# Patient Record
Sex: Male | Born: 1945 | Race: White | Hispanic: No | Marital: Single | Smoking: Current every day smoker
Health system: Southern US, Community
[De-identification: ages and names within clinical notes are randomized; demographics above are authoritative.]

## PROBLEM LIST (undated history)

## (undated) DIAGNOSIS — R531 Weakness: Secondary | ICD-10-CM

## (undated) DIAGNOSIS — E785 Hyperlipidemia, unspecified: Secondary | ICD-10-CM

## (undated) DIAGNOSIS — R609 Edema, unspecified: Secondary | ICD-10-CM

## (undated) DIAGNOSIS — G8929 Other chronic pain: Secondary | ICD-10-CM

## (undated) DIAGNOSIS — Z860100 Personal history of colon polyps, unspecified: Secondary | ICD-10-CM

## (undated) DIAGNOSIS — I739 Peripheral vascular disease, unspecified: Secondary | ICD-10-CM

## (undated) DIAGNOSIS — I2510 Atherosclerotic heart disease of native coronary artery without angina pectoris: Secondary | ICD-10-CM

## (undated) DIAGNOSIS — R600 Localized edema: Secondary | ICD-10-CM

## (undated) DIAGNOSIS — M25571 Pain in right ankle and joints of right foot: Secondary | ICD-10-CM

## (undated) DIAGNOSIS — E119 Type 2 diabetes mellitus without complications: Secondary | ICD-10-CM

## (undated) DIAGNOSIS — M109 Gout, unspecified: Secondary | ICD-10-CM

## (undated) DIAGNOSIS — G4730 Sleep apnea, unspecified: Secondary | ICD-10-CM

## (undated) DIAGNOSIS — C61 Malignant neoplasm of prostate: Secondary | ICD-10-CM

## (undated) DIAGNOSIS — G629 Polyneuropathy, unspecified: Secondary | ICD-10-CM

## (undated) DIAGNOSIS — I779 Disorder of arteries and arterioles, unspecified: Secondary | ICD-10-CM

## (undated) DIAGNOSIS — R011 Cardiac murmur, unspecified: Secondary | ICD-10-CM

## (undated) DIAGNOSIS — I498 Other specified cardiac arrhythmias: Secondary | ICD-10-CM

## (undated) DIAGNOSIS — I10 Essential (primary) hypertension: Secondary | ICD-10-CM

## (undated) DIAGNOSIS — R51 Headache: Secondary | ICD-10-CM

## (undated) DIAGNOSIS — T4145XA Adverse effect of unspecified anesthetic, initial encounter: Secondary | ICD-10-CM

## (undated) DIAGNOSIS — M1990 Unspecified osteoarthritis, unspecified site: Secondary | ICD-10-CM

## (undated) DIAGNOSIS — I495 Sick sinus syndrome: Secondary | ICD-10-CM

## (undated) DIAGNOSIS — M2550 Pain in unspecified joint: Secondary | ICD-10-CM

## (undated) DIAGNOSIS — M549 Dorsalgia, unspecified: Secondary | ICD-10-CM

## (undated) HISTORY — PX: CORONARY ANGIOPLASTY: SHX604

## (undated) HISTORY — DX: Sleep apnea, unspecified: G47.30

## (undated) HISTORY — DX: Hyperlipidemia, unspecified: E78.5

## (undated) HISTORY — PX: COLONOSCOPY: SHX174

## (undated) HISTORY — DX: Essential (primary) hypertension: I10

## (undated) HISTORY — DX: Disorder of arteries and arterioles, unspecified: I77.9

## (undated) HISTORY — PX: ESOPHAGOGASTRODUODENOSCOPY: SHX1529

## (undated) HISTORY — DX: Sick sinus syndrome: I49.5

## (undated) HISTORY — DX: Other specified cardiac arrhythmias: I49.8

## (undated) HISTORY — PX: BACK SURGERY: SHX140

## (undated) HISTORY — DX: Type 2 diabetes mellitus without complications: E11.9

## (undated) HISTORY — DX: Peripheral vascular disease, unspecified: I73.9

## (undated) HISTORY — DX: Atherosclerotic heart disease of native coronary artery without angina pectoris: I25.10

---
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
## 1994-07-11 HISTORY — PX: OTHER SURGICAL HISTORY: SHX169

## 1999-07-12 HISTORY — PX: CHOLECYSTECTOMY: SHX55

## 2002-11-23 ENCOUNTER — Ambulatory Visit (HOSPITAL_BASED_OUTPATIENT_CLINIC_OR_DEPARTMENT_OTHER): Admission: RE | Admit: 2002-11-23 | Discharge: 2002-11-23 | Payer: Self-pay | Admitting: Internal Medicine

## 2002-12-19 ENCOUNTER — Ambulatory Visit (HOSPITAL_BASED_OUTPATIENT_CLINIC_OR_DEPARTMENT_OTHER): Admission: RE | Admit: 2002-12-19 | Discharge: 2002-12-19 | Payer: Self-pay | Admitting: Internal Medicine

## 2005-01-06 ENCOUNTER — Encounter: Admission: RE | Admit: 2005-01-06 | Discharge: 2005-01-06 | Payer: Self-pay | Admitting: Gastroenterology

## 2005-03-23 ENCOUNTER — Ambulatory Visit (HOSPITAL_COMMUNITY): Admission: RE | Admit: 2005-03-23 | Discharge: 2005-03-23 | Payer: Self-pay | Admitting: Gastroenterology

## 2005-04-06 ENCOUNTER — Encounter: Admission: RE | Admit: 2005-04-06 | Discharge: 2005-04-06 | Payer: Self-pay | Admitting: Endocrinology

## 2005-09-05 ENCOUNTER — Encounter: Admission: RE | Admit: 2005-09-05 | Discharge: 2005-09-05 | Payer: Self-pay | Admitting: Endocrinology

## 2006-07-11 HISTORY — PX: CARDIAC CATHETERIZATION: SHX172

## 2007-03-28 ENCOUNTER — Ambulatory Visit: Payer: Self-pay | Admitting: Cardiology

## 2007-03-29 ENCOUNTER — Ambulatory Visit (HOSPITAL_COMMUNITY): Admission: EM | Admit: 2007-03-29 | Discharge: 2007-03-30 | Payer: Self-pay | Admitting: Interventional Cardiology

## 2007-04-11 ENCOUNTER — Ambulatory Visit (HOSPITAL_COMMUNITY): Admission: RE | Admit: 2007-04-11 | Discharge: 2007-04-11 | Payer: Self-pay | Admitting: Internal Medicine

## 2007-05-17 ENCOUNTER — Encounter (HOSPITAL_COMMUNITY): Admission: RE | Admit: 2007-05-17 | Discharge: 2007-07-11 | Payer: Self-pay | Admitting: Interventional Cardiology

## 2007-06-13 ENCOUNTER — Emergency Department (HOSPITAL_COMMUNITY): Admission: EM | Admit: 2007-06-13 | Discharge: 2007-06-13 | Payer: Self-pay | Admitting: Emergency Medicine

## 2007-06-20 ENCOUNTER — Encounter: Admission: RE | Admit: 2007-06-20 | Discharge: 2007-06-20 | Payer: Self-pay | Admitting: Interventional Cardiology

## 2007-07-10 ENCOUNTER — Encounter: Admission: RE | Admit: 2007-07-10 | Discharge: 2007-07-10 | Payer: Self-pay | Admitting: Interventional Cardiology

## 2007-12-07 ENCOUNTER — Emergency Department (HOSPITAL_COMMUNITY): Admission: EM | Admit: 2007-12-07 | Discharge: 2007-12-07 | Payer: Self-pay | Admitting: Emergency Medicine

## 2008-01-25 ENCOUNTER — Encounter: Admission: RE | Admit: 2008-01-25 | Discharge: 2008-01-25 | Payer: Self-pay | Admitting: Internal Medicine

## 2009-07-14 ENCOUNTER — Encounter: Admission: RE | Admit: 2009-07-14 | Discharge: 2009-07-14 | Payer: Self-pay | Admitting: Internal Medicine

## 2009-12-11 ENCOUNTER — Ambulatory Visit (HOSPITAL_COMMUNITY): Admission: RE | Admit: 2009-12-11 | Discharge: 2009-12-11 | Payer: Self-pay | Admitting: Neurological Surgery

## 2010-02-18 ENCOUNTER — Ambulatory Visit: Payer: Self-pay | Admitting: Vascular Surgery

## 2010-02-19 ENCOUNTER — Encounter: Admission: RE | Admit: 2010-02-19 | Discharge: 2010-02-19 | Payer: Self-pay | Admitting: Gastroenterology

## 2010-02-24 ENCOUNTER — Ambulatory Visit: Payer: Self-pay | Admitting: Vascular Surgery

## 2010-02-24 ENCOUNTER — Encounter: Admission: RE | Admit: 2010-02-24 | Discharge: 2010-02-24 | Payer: Self-pay | Admitting: Vascular Surgery

## 2010-08-01 ENCOUNTER — Encounter: Payer: Self-pay | Admitting: Internal Medicine

## 2010-11-23 NOTE — H&P (Signed)
Kenneth Lee, Kenneth Lee             ACCOUNT NO.:  [id number]   MEDICAL RECORD NO.:  [id number]          PATIENT TYPE:  INP   LOCATION:  0104                         FACILITY:  [facility]   PHYSICIAN:  Hollice Espy, M.D.DATE OF BIRTH:  [birth year]   DATE OF ADMISSION:  03/28/2007  DATE OF DISCHARGE:                              HISTORY & PHYSICAL   PRIMARY CARE PHYSICIAN:  Deirdre Peer. Polite, M.D.   CHIEF COMPLAINT:  Left arm and chest pain as well as lightheadedness.   HISTORY OF THE PRESENT ILLNESS:  The patient is a 65 year old within  with a past medical history of tobacco abuse, peripheral vascular  disease with aortic bifemoral bypass graft and hypertension who woke up  the day prior to admission with left arm pain.  He was also having some  tightness in his chest and he felt very lightheaded.  He noted not acute  shortness of breath and no pain elsewhere; but, came into the emergency  room for further evaluation.  In emergency room his EKG was noted to be  in sinus rhythm.  Chest x-ray was within; however, his laboratory  reports were noted for a elevated MB with a normal CPK and troponin.  His third set of enzymes and point of care markers came back slightly  elevated for CBC level given all troponins.  His symptoms, however,  continued to remain with his left arm pain and continued  lightheadedness.  There was a concern whether this was an irregular  cardiac ischemia versus some sort of subclavian steel syndrome.  Further  labs were ordered and he was found to have a moderately elevated D-dimer  at 0.8.  A CT scan of the chest was done to rule out pulmonary embolus.  All this was negative for any kind of an acute pulmonary embolus.  He  was found to have an area concerning in his left lung for a mass.  He  was also was noted have a small adrenal mass as well.  With these  findings there was concern that perhaps he had a tumor.  I discussed  this with the patient, who was  amenable for further testing.   Currently the patient was placed on a nitroglycerin drip for his chest  discomfort, which has since been close to resolving, as this is  very  minimal and barely noticeable, less than the 1 mg.  He complains of a  mild headache, but no vision changes.  No current chest pain, no  palpitations, no shortness of breath, wheezing, or coughing.  He does  have some left upper arm pain.  No abdominal pain.  No hematuria,  dysuria, constipation and diarrhea.  No focal extremity numbness,  weakness or pain other than that described above.   REVIEW OF SYSTEMS:  The review of systems is otherwise negative.   PAST MEDICAL HISTORY:  The patient's past medical history includes;  1. Tobacco abuse.  2. Hypertension.  3. History of peripheral vascular disease status post aortobifemoral      graft.   MEDICATIONS:  1. Cardizem 240  .  2. Aspirin 325  mg one daily.   SOCIAL HISTORY:  The patient smokes about a pack/day.  He denies any  drug or alcohol use.   ALLERGIES:  The patient has an allergy to PENICILLIN.   FAMILY HISTORY:  The family history is noncontributory.   PHYSICAL EXAMINATION:  VITAL SIGNS:  The patient's vital signs show a  temperature is 97.6, heart rate 72 and blood pressure 165/79 down to  129/57, respiratory rate 16, and O2 sat  99% on room air.  GENERAL APPEARANCE:  In general he alert and oriented.  HEENT:  The patient  is normocephalic and atraumatic.  His mucous  membranes are moist.  NECK:  The patient has no carotid bruits.  HEART:  The heart has a regular rate and rhythm.  S1 and S2.  LUNGS:  The lungs are clear to auscultation bilaterally.  ABDOMEN:  The abdomen is soft, and nontender and nondistended with  positive bowel sounds.  EXTREMITIES:  The extremities are without clubbing, cyanosis or edema.   LABORATORY STUDIES:  The lab work showed sodium 139, potassium 2.7,  chloride 104, bicarb 13, BUN 13, creatinine 1.04, and glucose  133.  White count 10, H&H 15 and 43 respectively, MCV 92, and platelet count  176,000 with normal differential.  Coags are normal.  CPK 124, MB 9,  Troponin I less 0.05.  One set of point of care markers showed a CPK of  290 and MB 10.7; the EKG and radiologic studies are as per the HPI.   ASSESSMENT AND PLAN:  1. Lung mass certainly concerning in a patient with a history of heavy      smoking.  We will check this up and order a positron emission      tomography.  2. Hypertension.  We will continue his Cardizem and lisinopril.  3. Chest discomfort, atypical and possibly could be cardiac in nature.      For now we will continue Nitrol paste and check serial sets of      enzymes.  If these are negative we will consider possibly an      angiogram, maybe perhaps as an outpatient of his positron emission      tomography,  is deed, negative.  4. Peripheral vascular disease, stable.      Hollice Espy, M.D.  Electronically Signed     SKK/MEDQ  D:  03/29/2007  T:  03/29/2007  Job:  [id number]

## 2010-11-23 NOTE — Assessment & Plan Note (Signed)
OFFICE VISIT   Kenneth Lee, Kenneth Lee  DOB:  06/26/1946                                       02/18/2010  BJYNW#:[id number]   I saw the patient in the office today concerning some swelling in his  right groin.  This is a pleasant 65 year old gentleman who had undergone  an endovascular intervention by Dr. 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  He underwent a semi-urgent aortobifemoral bypass graft on  01/26/1995.  Of note, the proximal anastomosis was end-to-side and he  required a fairly extensive endarterectomy in the right groin with  extension down on to the superficial femoral artery.  He had been lost  to followup as he was doing quite well with no lower extremity symptoms.  Specifically he had no claudication, rest pain or nonhealing ulcers.  Approximately 2 weeks ago he just happened to notice some swelling in  the right groin that he was concerned about.  He comes in to have this  evaluated.  Of note, he has had no claudication, rest pain or nonhealing  ulcers.  He has had no fever or chills.  He has noted no drainage from  the groin and no erythema in the groin.  He has overall been doing well  except for he has been having problems with back pain and then became  quite constipated from pain medicine and he is recovering from all of  this.   His past medical history is significant for hypertension and  hypercholesterolemia, both of which are stable on his current  medications and he is followed closely by Dr. Nehemiah Settle.  He denies any  history of diabetes, history of previous myocardial infarction, history  of congestive heart failure or history of COPD.   SOCIAL HISTORY:  He is widowed.  He has two children.  He does not smoke  cigarettes.   FAMILY HISTORY:  There is no history of premature cardiovascular disease  that he is aware of.   REVIEW OF SYSTEMS:  GENERAL:  He  has had no recent weight loss, weight  gain or problems with his appetite.  He has had no fever or chills.  CARDIOVASCULAR:  He has had no chest pain, chest pressure, palpitations  or arrhythmias.  He has had no history of stroke, TIAs.  He has had no  history of DVT or phlebitis.  GI, neurologic, pulmonary, hematologic, urinary, ENT, musculoskeletal,  psychiatric and integumentary review of systems is unremarkable and is  documented on the medical history form in his chart.   PHYSICAL EXAMINATION:  General:  This is a pleasant 65 year old  gentleman who appears his stated age.  Blood pressure is 145/90,  temperature is 98.2, saturation 100%.  HEENT:  Unremarkable.  Lungs:  Are clear bilaterally to auscultation without rales, rhonchi or  wheezing.  Cardiovascular:  I do not detect any carotid bruits.  He has  a regular rate and rhythm.  He has palpable femoral, popliteal and pedal  pulses bilaterally.  Abdomen:  Soft, nontender with no evidence of  hernia.  Musculoskeletal:  There are no major deformities or cyanosis.  Neurological:  He has no focal weakness or paresthesias.  In  specifically examining the right groin with him supine certainly I do  not see  any swelling.  With him standing there simply appears to be a  fold in the skin of adipose tissue.  I do not see any evidence of  erythema or suggestion of infection.   Regardless of his exam which appears fairly benign given that he has a  prosthetic graft I think it would be imperative to obtain a CT scan just  to be sure there is no evidence of graft infection given his history of  swelling in the right groin.  We will arrange for the CT scan and I will  see him back after this.  We will make further recommendations pending  these results.     Di Kindle. Edilia Bo, M.D.  Electronically Signed   CSD/MEDQ  D:  02/18/2010  T:  02/19/2010  Job:  [id number]   cc:   Deirdre Peer. Polite, M.D.

## 2010-11-23 NOTE — Consult Note (Signed)
NAMEDINO, BORNTREGER             ACCOUNT NO.:  [id number]   MEDICAL RECORD NO.:  [id number]          PATIENT TYPE:  EMS   LOCATION:  ED                           FACILITY:  [facility]   PHYSICIAN:  Bevelyn Buckles. Bensimhon, MDDATE OF BIRTH:  [birth year]   DATE OF CONSULTATION:  DATE OF DISCHARGE:                                 CONSULTATION   CONSULTING PHYSICIAN:  Dr. Deretha Emory   PRIMARY CARE PHYSICIAN:  Dr. Nehemiah Settle   The patient's total visit time:  Appropriately 40 minutes.   The patient is being admitted for possible rule-out  coronary syndrome.   HISTORY OF PRESENT ILLNESS:  Mr. Devera is a 65 year old male with  history of possible heavy alcohol use and hypertension, who presents  with apparently sudden onset of left biceps pain with associated  lightheadedness and tachy palpations that woke him up on the evening  prior to admission.  Patient notes this has been going on for the last  few months, but never woke him up.  Patient apparently had an episode in  the ER and  there are no changes on telemetry.  He denies any frank  shortness of breath or chest pain, no paroxysmal nocturnal dyspnea or  orthopnea, no fevers or cough, no dysuria, hematuria.  In the emergency  room, he was given aspirin and was placed on nitroglycerin drip.   CARDIAC HISTORY:  Patient notes he had a left heart catheterization and  coronary angiogram 12 years ago and it was okay and a history five  years ago of stress testing which was unremarkable per the patient.   PAST MEDICAL HISTORY/PAST SURGICAL HISTORY:  1. History of aortic femoral bypass bilaterally 12 years ago.  2. Status post cholecystectomy in 1990.  3. History of back surgery.  4. History of hypertension.  5. History of obstructive sleep apnea on CPAP.   MEDICATIONS:  He notes he is on:  1. Cartia XL 250 mg per patient.  2. Lisinopril 20 mg daily.  3. Enteric-coated aspirin 325 mg daily.  4. Multivitamin.   ALLERGIES:  HE IS ALLERGIC TO  PENICILLIN WHICH CAUSES HAND SWELLING AND  FACIAL SWELLING.   SOCIAL HISTORY:  He smokes one pack per day and has been doing so since  his teenage years, occasional wine.  The patient is currently employed  in [employer].  He is accompanied by his wife.   FAMILY HISTORY:  Positive for brother and father with coronary artery  disease; brother had a myocardial infarction.  Father's coronary artery  disease onset was 85 and brother 89.   REVIEW OF SYSTEMS:  The 12-point review of systems is negative unless  stated above.   PHYSICAL EXAMINATION:  VITAL SIGNS:  Temperature is 97.6 with a blood  pressure of 139/68, pulse 56, respiratory rate of 14, sats are 98% on  two liters of nasal cannula.  GENERAL:  He is an obese male lying flat in bed in no acute distress.  HEENT:  Normocephalic, atraumatic.  His pupils are equal, round and  reactive to light.  Extraocular muscles intact. Oropharynx is moist with  no posterior pharyngeal  lesions.  NECK:  Supple with no lymphadenopathy.  No thyromegaly.  No carotid  bruits.  LUNGS:  Clear to auscultation bilaterally.  CARDIOVASCULAR:  Regular rhythm and rate.  No murmurs, rubs or gallops.  Normal S1, S2.  No S3 or S4.  EXTREMITIES:  On examination of the left arm biceps area, there is no  apparent tenderness on palpation, no swelling, no redness although the  patient notes he does a knot in the area of distal bicep.  Patient's  extremities show no clubbing or cyanosis.  There is trace lower  extremity edema.  Vascular exam is significant for warmth plus bilateral  dorsalis pedis and posterior tibial  pulses which are decreased.  No  apparent carotid bruit. Good radial pulses.  NEUROLOGIC EXAM:  He is alert and oriented x4.  Cranial nerves II-XII  grossly intact.  Strength and sensation are grossly intact.   LABS:  Sodium is 139, potassium 3.7, chloride 104, bicarb 27, BUN 13,  creatinine 1.04, glucose 133.  INR 1.0.  Cardiac markers performed at   2250 showed an MB of 9.0, troponin less than 0.05.  At 2350, MB was 8.3,  troponin less than 0.05.  D-dimer was elevated at 0.85.  At 0015 on the  day of admission, March 29, 2007, troponin was 0.02 and CK elevated  at 311, MB 12.3 with an index of 4.0, INR 1.0.  White count of 10.2,  hemoglobin 16.1, hematocrit 42.9, platelets 176 with a normal  differential.   CT of the chest is pending.   EKG showed normal sinus rhythm and normal axis, normal intervals with a  PR interval of 186, QRS 104 and QT corrected 413 msec with good R wave  progression, no evidence of ST-T changes and no apparent T waves.   ASSESSMENT/PLAN:  This is a patient with  history of significant  peripheral vascular disease who is having left arm pain paroxysmally  associated with lightheadedness.  The concern is for possible subclavian  stenosis that is causing cardiac awareness due to differential blood  flow to the brain. This also be causing cramping of the arm.  His pulses  are good radially.  We counseled the patient at length to discontinued  tobacco abuse and recommended that he needed to quit now.  It is  suggested that he undergo an arteriogram of that area of the arm and  bilaterally, or a left heart catheterization procedure, coronary  angiogram with a look also in the upper arterial tree in the thorax  which would also take care of a coronary blood flow assessment.      Darryl D. Prime, MD   Electronically Signed     ______________________________  Bevelyn Buckles. Bensimhon, MD   DDP/MEDQ  D:  03/29/2007  T:  03/29/2007  Job:  [id number]

## 2010-11-23 NOTE — Discharge Summary (Signed)
Kenneth Lee, Kenneth Lee             ACCOUNT NO.:  [id number]   MEDICAL RECORD NO.:  [id number]          PATIENT TYPE:  OIB   LOCATION:  6527                         FACILITY:  [facility]   PHYSICIAN:  Deirdre Peer. Polite, M.D. DATE OF BIRTH:  1945-11-01   DATE OF ADMISSION:  03/29/2007  DATE OF DISCHARGE:  03/30/2007                               DISCHARGE SUMMARY   DISCHARGE DIAGNOSES:  1. Atypical chest pain status post cardiac catheterization, which      revealed proximal right coronary artery stenosis status post bare-      metal stent, moderate left anterior descending disease.  There is      heavy calcification in the mid left anterior descending, gives      origin to moderate size diagonals, 75% stenosis in the proximal      segment of the second diagonal.  First diagonal contains a 40%      proximal stenosis.  Diffuse 50% obstruction is noted in the      proximal mid left anterior descending.  Circumflex vessel 40%      obstruction in the ostium of the first marginal.  Again as stated,      right coronary artery 95% obstructed with eccentric lesion in the      proximal vessel with severe proximal right coronary artery stenosis      with large distal territory, treated successfully with bare-metal      stent of 95% to 0% with TIMI grade 3 flow.  Normal left ventricular      function.  Again, heavy calcification and moderate left anterior      descending, and diagonal disease.  Final recommendation -      aggressive risk factor modification, aspirin and Plavix for a      minimum of four weeks.  2. Right upper lobe lung mass.  Suspicious for cancer, 1.8 cm via CAT      scan.  Patient to have outpatient PET scan with potential biopsy in      4 to 6 weeks.  3. Hypertension.  4. Dyslipidemia.  5. Obesity.  6. Tobacco dependence.  7. Sleep apnea.  8. History of peripheral vascular disease status post aortofemoral      bypass.  9. A 2 cm indeterminate right adrenal mass.   DISCHARGE  MEDICATIONS:  1. Aspirin 325 mg one daily.  2. Plavix 75 mg daily.  3. Lisinopril daily.  4. Metoprolol 50 mg daily.  5. Simvastatin 80 mg daily.   DISPOSITION:  Discharged to home in stable condition.  Asked to follow  up with primary MD in one week.  Follow up with Dr. Garnette Scheuermann in  approximately 2 to 3 weeks.  The patient has been advised to stop  smoking immediately.  To start a low fat, low cholesterol diet.   CONSULTANTS:  Dr. Garnette Scheuermann.   STUDIES:  Heart catheterization as stated above.  CT angio of the chest  showed no evidence of PE, a 1.8 cm irregular mass-like opacity of right  upper lobe suspicious for neoplasm.  PET scan recommended.  CBC within  normal limits.  BMET within normal limits.  Troponin I 0.05.  Total  cholesterol 194, HDL 39, LDL 27.  EKG sinus rhythm without acute  abnormalities.   HISTORY OF PRESENT ILLNESS:  A 65 year old male with multiple medical  problems presented to the hospital with atypical chest pain  characterized by left arm pain and some palpations.  In the ED he was  evaluated, had several studies, admission was deemed necessary for  further evaluation and treatment.  Please dictated H&P for further  details.   PAST MEDICAL HISTORY:  As stated above.   CURRENT MEDICATIONS:  On admission included:  1. Cardizem.  2. Aspirin.  3. Lisinopril.   SOCIAL HISTORY:  Positive tobacco, social alcohol.  No drugs.   ALLERGIES:  STATES ALLERGIC PENICILLIN.   FAMILY HISTORY:  Per admission H&P.   HOSPITAL COURSE:  1. The patient is admitted to a telemetry floor bed for evaluation and      treatment of his multiple comorbidities.  The patient presented      with atypical chest pain, was seen in consultation by cardiology,      cardiac catheterization was recommended.  Ultimately the patient      underwent cardiac catheterization and had a bare-metal stent in his      RCA.  Aggressive risk factor modification was recommended for the      other  abnormalities.  2. Atypical pain in the left arm.  No recurrence.  The patient tried      to describe it, he had just a vague sensation of pain in his left      arm.  He has 2+ pulse, no significant decrease in blood pressure      when taken from that arm.  Question was this was just his atypical      presentation of cardiac disease.  He will have further outpatient      followup as needed.  3. Right upper lobe mass 1.8 cm suspicious for neoplasm.  PET      recommended.  The patient will have outpatient PET scan and further      evaluation and followup.  I have discussed this with the patient in      detail and made him aware that we wanted to make sure this is not      cancer, he understands.  4. Tobacco dependence.  Recommended stop now.  5. Hypertension.  Slightly greater than optimal at discharge.  Meds as      stated above.  6. Dyslipidemia.  Simvastatin has been started at 80 mg.  7. Sleep apnea.  Continue CPAP.   At this time the patient is being discharged in stable condition.  New  medication includes Plavix, metoprolol, he is also going to continue to  aspirin 325 daily, to stop smoking and have further followup on an  outpatient basis.      Deirdre Peer. Polite, M.D.  Electronically Signed     RDP/MEDQ  D:  03/30/2007  T:  03/31/2007  Job:  [id number]

## 2010-11-23 NOTE — Cardiovascular Report (Signed)
NAMEAJ, CRUNKLETON             ACCOUNT NO.:  [id number]   MEDICAL RECORD NO.:  [id number]          PATIENT TYPE:  OIB   LOCATION:  6527                         FACILITY:  [facility]   PHYSICIAN:  Lyn Records, M.D.   DATE OF BIRTH:  [birth year]   DATE OF PROCEDURE:  DATE OF DISCHARGE:                            CARDIAC CATHETERIZATION   INDICATIONS FOR PROCEDURE:  The patient has coronary artery disease  documented by heavy calcification noted on chest CT in the left  coronary.  He has had 2 positive CK-MBs.  He presented with atypical arm  pain.  Cath is being done to rule out coronary artery disease.   PROCEDURE PERFORMED:  1. Left heart cath.  2. Coronary angiogram.  3. Left ventriculorrhaphy.  4. Bare-metal stent proximal RCA.   DESCRIPTION OF PROCEDURE:  After informed consent, a 6-French sheath was  placed in the right femoral artery using the modified Seldinger  technique.  The patient received several aliquots of IV Versed and  fentanyl 1/25 for sedation.  Local Xylocaine infiltration was used.  Sheath was placed with no difficulties in a modified Seldinger  technique.  A 6-French A2 multipurpose catheter was then used for  hemodynamic recordings, left ventriculorrhaphy by hand injection, and  selective left coronary angiography.  Injecting his right catheter, 6-  [place] was used for the right coronary angiography.   After reviewing the digital images, it was felt that the right coronary  artery was a large vessel with severe proximal obstruction that was at  risk for causing the patient to have rather large infarction if it  occluded.  After speaking with the patient and wife, we proceeded with  the PCI on the right coronary.   The 600 mg oral Plavix was administered before PCI.  A bolus and  infusion of Angiomax was started.  We used a side-hole JR4 guide  catheter and ASAHI Prowater guide wire to perform the intervention.  We  predilated the lesion with a 3.0 x 12  mm long Maverick balloon and then  placed a 3.5 x 15 Vision stent and post dilated with a 3.5 x 12 quantum  Maverick to 16 atmospheres.  A very nice angiographic result was  obtained.  During balloon occlusion of the right coronary, the patient  developed chest pressure not left arm pain.  Therefore, the patient's  left arm pain was from some other source than his coronary artery  disease.   Manual compression would be used to achieve hemostasis.  Angiomax will  be discontinued.   RESULTS:  1. Hemodynamic data:      a.     Aortic pressure 146/74.      b.     Left ventricular pressure 154/15.  2. Left ventriculorrhaphy:  Left ventricle was normal in size, normal      contractility, EF 65%.  3. Coronary angiography.      a.     Left main coronary, widely patent moderate calcification.      b.     Left anterior descending:  Heavy calcification in the mid  LAD.  It gives origin to 2 moderate-sized diagonals.  They arise       from near single ostium in the mid LAD.  There is 75% stenosis in       the proximal segment of the second diagonal.  The first diagonal       contains a 40% proximal stenosis.  Diffuse 50% obstruction is       noted in the proximal mid LAD.      c.     Circumflex artery:  This vessel gives origin to 2 obtuse       marginal branches.  There is 40% obstruction in the ostium of the       first marginal.  No high grade obstruction is seen.      d.     Right coronary:  The right coronary artery is 95% obstructed       with an eccentric lesion in the proximal vessel.  Heavy plaquing       is noted in the mid vessel with obstruction up to 50%.  PDA and       left ventricular branches arise distally.  The right coronary was       dominant territory with a lot of myocardium at risk.  4. PCI of right coronary:  The RCA 95% stenosis was reduced to 0%      after predilatation with a 3.0 Maverick balloon stent deployment      using a Vision 3.5 x 15 and post dilatation  with a 3.5 x 12 quantum      to 16 atmosphere x2 inflations.  A 0% stenosis was noted      postprocedure with TIMI grade 3 flow.  No symptoms were present at      completion of the case.   CONCLUSIONS:  1. Severe proximal RCA stenosis with large distal territory treated      successfully with bare-metal stent from 95% and 0% with TIMI grade      3 flow.  2. Normal LV function.  3. Heavy calcification and moderate LAD and diagonal disease as      described above.   PLAN:  1. Aggressive risk factor modification.  2. Aspirin and Plavix for a minimum of 4 weeks.  3. The patient will be eligible to have a lung biopsy if indicated, 4      to 6 weeks from today's date.  4. Hopeful discharge in a.m.  5. Start statin therapy.      Lyn Records, M.D.  Electronically Signed     HWS/MEDQ  D:  03/29/2007  T:  03/30/2007  Job:  [id number]

## 2010-11-23 NOTE — Assessment & Plan Note (Signed)
OFFICE VISIT   Kenneth Lee, Kenneth Lee  DOB:  Oct 04, 1945                                       02/24/2010  ZOXWR#:[id number]   I saw patient in the office today to follow up on his CAT scan.  I had  seen him on 02/18/2010.  He was concerned about some swelling in the  right groin.  He had undergone previous semi-emergent aortobifemoral  bypass graft back in 1996.  I was unimpressed with his exam but did feel  that we should get a CT scan to be sure there were no problems with his  graft.  A CT scan today was performed and showed no complicating  features associated with his graft.  There is no evidence of fluid or  abnormalities associated with the graft.  The only note was some mild  potential swelling in the terminal ileum, and he had been having some  problems with his bowels after he got significant constipation after  being on a lot of pain medicines for back pain after an injury he  sustained while waterskiing.   On exam, he is afebrile at 97.4, blood pressure 115/75, heart rate is  70.  Abdomen is soft and nontender.  His incisions in the groin look  fine.   I have reassured him that we do not see any problems with his CAT scan.  I have reviewed his study, which shows no problems with his graft.  We  will see him back p.r.n.  We again discussed the importance of receiving  prophylactic antibiotics for any invasive procedures.     Di Kindle. Edilia Bo, M.D.  Electronically Signed   CSD/MEDQ  D:  02/24/2010  T:  02/24/2010  Job:  [id number]

## 2010-11-26 NOTE — Op Note (Signed)
NAMEKRISTIN, Kenneth Lee             ACCOUNT NO.:  [id number]   MEDICAL RECORD NO.:  [id number]          PATIENT TYPE:  AMB   LOCATION:  ENDO                         FACILITY:  [facility]   PHYSICIAN:  Danise Edge, M.D.   DATE OF BIRTH:  02-Sep-1945   DATE OF PROCEDURE:  03/23/2005  DATE OF DISCHARGE:                                 OPERATIVE REPORT   PROCEDURE:  Colonoscopy.   PROCEDURE INDICATIONS:  Mr. Forrest Jaroszewski is a 64 year old male born Jun 10, 1946.  On Nov 12, 2004 he underwent a proctocolonoscopy to the proximal  descending colon.  I was unable to examine the cecum due to colonic loop  formation.  From the left colon at 40 cm from the anal verge, a diminutive  polyp was removed with the electrocautery snare, but the specimen could not  be retrieved for pathology.   On January 06, 2005, he underwent a virtual colonoscopy which revealed a 7-8 mm  sessile polypoid lesion at 68 cm in the descending colon.  Repeat  colonoscopy is scheduled today to remove the descending colon polyp.   ENDOSCOPIST:  Danise Edge, M.D.   PREOPERATIVE MEDICATION:  Versed 10 mg, Demerol 100 mg.   DESCRIPTION OF PROCEDURE:  After obtaining informed consent, Mr. Menken was  placed in the left lateral decubitus position.  I administered intravenous  Demerol and intravenous Versed to achieve conscious sedation for the  procedure. The patient's blood pressure, oxygen saturation and cardiac  rhythm were monitored throughout the procedure and documented in the medical  record.   Anal inspection and digital rectal exam were normal.  The Olympus adjustable  pediatric colonoscope was introduced into the rectum and easily advanced to  the proximal ascending colon.  The ileocecal valve was visible, but I could  not intubate the cecum due to colonic loop formation.  Colonic preparation  for the exam today was excellent.   FINDINGS:  1.  Rectum normal. Retroflexed view of the distal rectum normal.  2.   Sigmoid colon and descending colon:  Left colonic diverticulosis.  No      polyp identified.  3.  Splenic flexure normal.  4.  Transverse colon normal.  5.  Hepatic flexure normal.  6.  Ascending colon normal.   ASSESSMENT:  Left colonic diverticulosis.  I could not identify a colorectal  polyps by repeat colonoscopy despite a good visualization of the descending  colon.   RECOMMENDATIONS:  Mr. Kresse has hypertension.  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   I have referred Mr. Orvis to [facility] to evaluate the right adrenal  nodule and rule out a functioning nodule (Cushing's syndrome,  pheochromocytoma, and primary hyperaldosteronism).           ______________________________  Danise Edge, M.D.     MJ/MEDQ  D:  03/23/2005  T:  03/23/2005  Job:  [id number]   cc:   Dorisann Frames, M.D.  [address] [place] [address]  Fax: [phone]

## 2011-04-06 LAB — DIFFERENTIAL
Basophils Absolute: 0
Basophils Relative: 0
Eosinophils Absolute: 0.2
Eosinophils Relative: 2
Lymphocytes Relative: 33
Lymphs Abs: 2.6
Monocytes Absolute: 0.7
Monocytes Relative: 9
Neutro Abs: 4.5
Neutrophils Relative %: 56

## 2011-04-06 LAB — POCT CARDIAC MARKERS
CKMB, poc: 5.3
CKMB, poc: 6.7
Myoglobin, poc: 113
Myoglobin, poc: 141
Operator id: 161631
Operator id: 161631
Troponin i, poc: <0.05
Troponin i, poc: <0.05

## 2011-04-06 LAB — POCT I-STAT, CHEM 8
BUN: 18
Calcium, Ion: 1.11 — ABNORMAL LOW
Chloride: 107
Creatinine, Ser: 0.8
Glucose, Bld: 144 — ABNORMAL HIGH
HCT: 40
Hemoglobin: 13.6
Potassium: 3.4 — ABNORMAL LOW
Sodium: 142
TCO2: 24

## 2011-04-06 LAB — CBC
HCT: 39.8
Hemoglobin: 14.1
MCHC: 35.4
MCV: 93.2
Platelets: 147 — ABNORMAL LOW
RBC: 4.27
RDW: 13
WBC: 8

## 2011-04-18 LAB — I-STAT 8, (EC8 V) (CONVERTED LAB)
Acid-Base Excess: 1
BUN: 19
Bicarbonate: 25.1 — ABNORMAL HIGH
Chloride: 105
Glucose, Bld: 115 — ABNORMAL HIGH
HCT: 40
Hemoglobin: 13.6
Operator id: 196461
Potassium: 3.8
Sodium: 138
TCO2: 26
pCO2, Ven: 36.8 — ABNORMAL LOW
pH, Ven: 7.441 — ABNORMAL HIGH

## 2011-04-18 LAB — CBC
HCT: 38.1 — ABNORMAL LOW
Hemoglobin: 13.4
MCHC: 35.1
MCV: 90.8
Platelets: 170
RBC: 4.19 — ABNORMAL LOW
RDW: 12.8
WBC: 8.7

## 2011-04-18 LAB — POCT I-STAT CREATININE
Creatinine, Ser: 0.9
Operator id: 196461

## 2011-04-18 LAB — DIFFERENTIAL
Basophils Absolute: 0
Basophils Relative: 0
Eosinophils Absolute: 0.1 — ABNORMAL LOW
Eosinophils Relative: 1
Lymphocytes Relative: 23
Lymphs Abs: 2
Monocytes Absolute: 0.9
Monocytes Relative: 11
Neutro Abs: 5.7
Neutrophils Relative %: 65

## 2011-04-18 LAB — POCT CARDIAC MARKERS
CKMB, poc: 6.3
CKMB, poc: 7.3
Myoglobin, poc: 129
Myoglobin, poc: 98.1
Operator id: 196461
Operator id: 196461
Troponin i, poc: <0.05
Troponin i, poc: <0.05

## 2011-04-21 LAB — BASIC METABOLIC PANEL
BUN: 11
BUN: 13
CO2: 27
CO2: 29
Calcium: 9
Calcium: 9.4
Chloride: 104
Chloride: 106
Creatinine, Ser: 0.98
Creatinine, Ser: 1.04
GFR calc Af Amer: >60
GFR calc Af Amer: >60
GFR calc non Af Amer: >60
GFR calc non Af Amer: >60
Glucose, Bld: 115 — ABNORMAL HIGH
Glucose, Bld: 133 — ABNORMAL HIGH
Potassium: 3.7
Potassium: 4.4
Sodium: 139
Sodium: 139

## 2011-04-21 LAB — CBC
HCT: 41
HCT: 42.9
Hemoglobin: 14.1
Hemoglobin: 15
MCHC: 34.4
MCHC: 34.9
MCV: 92.1
MCV: 93.4
Platelets: 160
Platelets: 176
RBC: 4.39
RBC: 4.65
RDW: 12.7
RDW: 13.2
WBC: 10.1
WBC: 8.9

## 2011-04-21 LAB — CK TOTAL AND CKMB (NOT AT ARMC)
CK, MB: 10.7 — ABNORMAL HIGH
CK, MB: 12.3 — ABNORMAL HIGH
Relative Index: 3.7 — ABNORMAL HIGH
Relative Index: 4 — ABNORMAL HIGH
Total CK: 290 — ABNORMAL HIGH
Total CK: 311 — ABNORMAL HIGH

## 2011-04-21 LAB — DIFFERENTIAL
Basophils Absolute: 0.1
Basophils Relative: 1
Eosinophils Absolute: 0.2
Eosinophils Relative: 2
Lymphocytes Relative: 40
Lymphs Abs: 4.1 — ABNORMAL HIGH
Monocytes Absolute: 0.9 — ABNORMAL HIGH
Monocytes Relative: 8
Neutro Abs: 4.9
Neutrophils Relative %: 49

## 2011-04-21 LAB — LIPID PANEL
Cholesterol: 194
HDL: 39 — ABNORMAL LOW
LDL Cholesterol: 127 — ABNORMAL HIGH
Total CHOL/HDL Ratio: 5
Triglycerides: 140
VLDL: 28

## 2011-04-21 LAB — POCT CARDIAC MARKERS
CKMB, poc: 7
CKMB, poc: 8.3
CKMB, poc: 9
Myoglobin, poc: 111
Myoglobin, poc: 124
Myoglobin, poc: 148
Operator id: 4531
Operator id: 4533
Operator id: 4533
Troponin i, poc: <0.05
Troponin i, poc: <0.05
Troponin i, poc: <0.05

## 2011-04-21 LAB — D-DIMER, QUANTITATIVE (NOT AT ARMC): D-Dimer, Quant: 0.85 — ABNORMAL HIGH

## 2011-04-21 LAB — PROTIME-INR
INR: 1
Prothrombin Time: 13.4

## 2011-04-21 LAB — TROPONIN I: Troponin I: 0.02

## 2011-07-19 DIAGNOSIS — M5126 Other intervertebral disc displacement, lumbar region: Secondary | ICD-10-CM | POA: Diagnosis not present

## 2011-07-19 DIAGNOSIS — IMO0002 Reserved for concepts with insufficient information to code with codable children: Secondary | ICD-10-CM | POA: Diagnosis not present

## 2011-07-28 DIAGNOSIS — M47817 Spondylosis without myelopathy or radiculopathy, lumbosacral region: Secondary | ICD-10-CM | POA: Diagnosis not present

## 2011-08-09 DIAGNOSIS — M542 Cervicalgia: Secondary | ICD-10-CM | POA: Diagnosis not present

## 2011-08-09 DIAGNOSIS — M79609 Pain in unspecified limb: Secondary | ICD-10-CM | POA: Diagnosis not present

## 2011-09-09 DIAGNOSIS — H1030 Unspecified acute conjunctivitis, unspecified eye: Secondary | ICD-10-CM | POA: Diagnosis not present

## 2012-03-29 DIAGNOSIS — E785 Hyperlipidemia, unspecified: Secondary | ICD-10-CM | POA: Diagnosis not present

## 2012-05-14 DIAGNOSIS — I10 Essential (primary) hypertension: Secondary | ICD-10-CM | POA: Diagnosis not present

## 2012-05-14 DIAGNOSIS — E785 Hyperlipidemia, unspecified: Secondary | ICD-10-CM | POA: Diagnosis not present

## 2012-05-14 DIAGNOSIS — I779 Disorder of arteries and arterioles, unspecified: Secondary | ICD-10-CM | POA: Diagnosis not present

## 2012-05-14 DIAGNOSIS — I2510 Atherosclerotic heart disease of native coronary artery without angina pectoris: Secondary | ICD-10-CM | POA: Diagnosis not present

## 2012-05-14 DIAGNOSIS — I495 Sick sinus syndrome: Secondary | ICD-10-CM | POA: Diagnosis not present

## 2012-06-01 DIAGNOSIS — I779 Disorder of arteries and arterioles, unspecified: Secondary | ICD-10-CM | POA: Diagnosis not present

## 2012-06-01 DIAGNOSIS — R599 Enlarged lymph nodes, unspecified: Secondary | ICD-10-CM | POA: Diagnosis not present

## 2012-06-01 DIAGNOSIS — I6529 Occlusion and stenosis of unspecified carotid artery: Secondary | ICD-10-CM | POA: Diagnosis not present

## 2012-06-12 DIAGNOSIS — Z23 Encounter for immunization: Secondary | ICD-10-CM | POA: Diagnosis not present

## 2013-01-25 DIAGNOSIS — D692 Other nonthrombocytopenic purpura: Secondary | ICD-10-CM | POA: Diagnosis not present

## 2013-03-19 DIAGNOSIS — L570 Actinic keratosis: Secondary | ICD-10-CM | POA: Diagnosis not present

## 2013-03-19 DIAGNOSIS — D239 Other benign neoplasm of skin, unspecified: Secondary | ICD-10-CM | POA: Diagnosis not present

## 2013-03-19 DIAGNOSIS — L821 Other seborrheic keratosis: Secondary | ICD-10-CM | POA: Diagnosis not present

## 2013-03-19 DIAGNOSIS — L909 Atrophic disorder of skin, unspecified: Secondary | ICD-10-CM | POA: Diagnosis not present

## 2013-03-19 DIAGNOSIS — D2330 Other benign neoplasm of skin of unspecified part of face: Secondary | ICD-10-CM | POA: Diagnosis not present

## 2013-03-19 DIAGNOSIS — D1801 Hemangioma of skin and subcutaneous tissue: Secondary | ICD-10-CM | POA: Diagnosis not present

## 2013-05-07 DIAGNOSIS — Z23 Encounter for immunization: Secondary | ICD-10-CM | POA: Diagnosis not present

## 2013-05-09 ENCOUNTER — Encounter: Payer: Self-pay | Admitting: Interventional Cardiology

## 2013-05-13 ENCOUNTER — Ambulatory Visit: Payer: Self-pay | Admitting: Interventional Cardiology

## 2013-05-14 ENCOUNTER — Telehealth: Payer: Self-pay

## 2013-05-14 ENCOUNTER — Other Ambulatory Visit: Payer: Self-pay

## 2013-05-14 MED ORDER — FOSINOPRIL SODIUM 20 MG PO TABS: 20.0000 mg | ORAL_TABLET | Freq: Two times a day (BID) | ORAL | Status: DC

## 2013-05-14 NOTE — Telephone Encounter (Signed)
Done

## 2013-05-21 ENCOUNTER — Ambulatory Visit (INDEPENDENT_AMBULATORY_CARE_PROVIDER_SITE_OTHER): Payer: Medicare Other | Admitting: Interventional Cardiology

## 2013-05-21 ENCOUNTER — Encounter: Payer: Self-pay | Admitting: Interventional Cardiology

## 2013-05-21 VITALS — BP 164/80 | HR 47 | Ht 71.0 in | Wt 238.0 lb

## 2013-05-21 DIAGNOSIS — I2510 Atherosclerotic heart disease of native coronary artery without angina pectoris: Secondary | ICD-10-CM | POA: Diagnosis not present

## 2013-05-21 DIAGNOSIS — I10 Essential (primary) hypertension: Secondary | ICD-10-CM | POA: Diagnosis not present

## 2013-05-21 DIAGNOSIS — E785 Hyperlipidemia, unspecified: Secondary | ICD-10-CM | POA: Diagnosis not present

## 2013-05-21 NOTE — Patient Instructions (Signed)
Your physician recommends that you continue on your current medications as directed. Please refer to the Current Medication list given to you today.  Your physician wants you to follow-up in: 1 year You will receive a reminder letter in the mail two months in advance. If you don't receive a letter, please call our office to schedule the follow-up appointment.  Try to stay active 

## 2013-05-21 NOTE — Progress Notes (Signed)
Patient ID: Kenneth Lee, male   DOB: [birth year], 67 y.o.   MRN: [id number]    [address] [name], [place]  [address] Phone: [phone] Fax:  [phone]  Date:  05/21/2013   ID:  Kenneth Lee, DOB 1945-07-25, MRN [id number]  PCP:  Kenneth Apo, MD   ASSESSMENT:  1. Coronary artery disease, stable without angina. Status post RCA bare-metal stent, 2008. 2. Hypertension, controlled 3. 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 Hyperlipidemia  PLAN:  1. No change in therapy. Encourage active lifestyle. 2. Watch diet to help control weight 3. No specific cardiac evaluation indicated   SUBJECTIVE: Kenneth Lee is a 67 y.o. male who is doing relatively well. He is sleeping well and as a result feels fatigued most of the time. He denies cardiopulmonary complaints. He specifically denies angina and nitroglycerin use. There no particular medication side effects. He has not had syncope and denies lower extremity swelling. He is not been as active as he should be but when he works in the yard and does other activities his back limits his efforts. He is considering having surgery.   Wt Readings from Last 3 Encounters:  05/21/13 238 lb (107.956 kg)     Past Medical History  Diagnosis Date  . Erectile dysfunction   . Hypertension   . Sleep apnea   . Hyperlipidemia     mixed hyperlidemia  . Goiter   . Other specified cardiac dysrhythmias(427.89)   . Sinoatrial node dysfunction   . Carotid disease, bilateral   . Coronary artery disease     with bare mental stent 2008 and RCA is Card   Dr.Smith  . History of aortoiliofemoral vascular bypass     Dr Edilia Bo    Current Outpatient Prescriptions  Medication Sig Dispense Refill  . amLODipine (NORVASC) 5 MG tablet Take 5 mg by mouth daily.       Marland Kitchen BYSTOLIC 10 MG tablet Take by mouth daily.       . clindamycin (CLEOCIN) 150 MG capsule Take 150 mg by mouth as needed (pRIOR TO DENTAL  PROCEDURES).       . CRESTOR 10 MG tablet Take 10 mg by mouth daily.       . fosinopril (MONOPRIL) 20 MG tablet Take 1 tablet (20 mg total) by mouth 2 (two) times daily.  60 tablet  1  . furosemide (LASIX) 40 MG tablet Take 40 mg by mouth daily.       . nabumetone (RELAFEN) 500 MG tablet Take 500 mg by mouth 2 (two) times daily.        No current facility-administered medications for this visit.    Allergies:    Allergies  Allergen Reactions  . Penicillins Swelling    Social History:  The patient  reports that he quit smoking about 9 years ago. He does not have any smokeless tobacco history on file.   ROS:  Please see the history of present illness.   Denies orthopnea, wheezing, cough, syncope, prolonged palpitations, and edema.   All other systems reviewed and negative.   OBJECTIVE: VS:  BP 164/80  Pulse 47  Ht 5\' 11"  (1.803 m)  Wt 238 lb (107.956 kg)  BMI 33.21 kg/m2 Well nourished, well developed, in no acute distress, obese HEENT: normal Neck: JVD flat. Carotid bruit absent  Cardiac:  normal S1, S2; RRR; no murmur Lungs:  clear to auscultation bilaterally, no wheezing, rhonchi or rales Abd: soft, nontender, no  hepatomegaly Ext: Edema trace. Pulses 2+ Skin: warm and dry Neuro:  CNs 2-12 intact, no focal abnormalities noted  EKG:  Sinus bradycardia, otherwise normal       Signed, Darci Needle III, MD 05/21/2013 12:03 PM

## 2013-05-28 NOTE — Telephone Encounter (Signed)
Refill

## 2013-06-04 ENCOUNTER — Other Ambulatory Visit: Payer: Self-pay

## 2013-06-04 MED ORDER — ROSUVASTATIN CALCIUM 10 MG PO TABS: 10.0000 mg | ORAL_TABLET | Freq: Every day | ORAL | Status: DC

## 2013-06-19 ENCOUNTER — Telehealth: Payer: Self-pay | Admitting: *Deleted

## 2013-06-19 NOTE — Telephone Encounter (Signed)
Patient needs refill of nabumetone to be [facility] to [facility] [address]. Thanks, MI

## 2013-06-19 NOTE — Telephone Encounter (Signed)
returned pt call. adv pt he would need to have Refalen refilled by his pcp.pt verbalized understanding.

## 2013-06-27 ENCOUNTER — Telehealth: Payer: Self-pay | Admitting: Interventional Cardiology

## 2013-06-27 NOTE — Telephone Encounter (Signed)
Walk In pt Form " [employer]" paper dropped off gave to [place]

## 2013-06-28 ENCOUNTER — Other Ambulatory Visit: Payer: Self-pay | Admitting: *Deleted

## 2013-06-28 MED ORDER — AMLODIPINE BESYLATE 5 MG PO TABS: 5.0000 mg | ORAL_TABLET | Freq: Every day | ORAL | Status: DC

## 2013-07-02 ENCOUNTER — Telehealth: Payer: Self-pay

## 2013-07-02 MED ORDER — LISINOPRIL 20 MG PO TABS: 20.0000 mg | ORAL_TABLET | Freq: Every day | ORAL | Status: DC

## 2013-07-02 NOTE — Telephone Encounter (Signed)
New rx fax form received from pt. Form completed by Dr.Smith for lisinopril 20mg  daily 90 R-3 faxed to [facility] [phone]

## 2013-07-18 ENCOUNTER — Other Ambulatory Visit: Payer: Self-pay | Admitting: *Deleted

## 2013-07-18 MED ORDER — FUROSEMIDE 40 MG PO TABS: 40.0000 mg | ORAL_TABLET | Freq: Every day | ORAL | Status: DC

## 2013-07-31 ENCOUNTER — Other Ambulatory Visit: Payer: Self-pay | Admitting: Interventional Cardiology

## 2013-08-01 DIAGNOSIS — E782 Mixed hyperlipidemia: Secondary | ICD-10-CM | POA: Diagnosis not present

## 2013-08-01 DIAGNOSIS — I10 Essential (primary) hypertension: Secondary | ICD-10-CM | POA: Diagnosis not present

## 2013-08-01 DIAGNOSIS — G4730 Sleep apnea, unspecified: Secondary | ICD-10-CM | POA: Diagnosis not present

## 2013-08-01 DIAGNOSIS — E663 Overweight: Secondary | ICD-10-CM | POA: Diagnosis not present

## 2013-12-26 ENCOUNTER — Other Ambulatory Visit: Payer: Self-pay | Admitting: *Deleted

## 2013-12-26 MED ORDER — ROSUVASTATIN CALCIUM 10 MG PO TABS: 10.0000 mg | ORAL_TABLET | Freq: Every day | ORAL | Status: DC

## 2014-01-07 ENCOUNTER — Other Ambulatory Visit: Payer: Self-pay | Admitting: *Deleted

## 2014-01-07 MED ORDER — FUROSEMIDE 40 MG PO TABS: 40.0000 mg | ORAL_TABLET | Freq: Every day | ORAL | Status: DC

## 2014-01-28 DIAGNOSIS — I10 Essential (primary) hypertension: Secondary | ICD-10-CM | POA: Diagnosis not present

## 2014-01-28 DIAGNOSIS — R5383 Other fatigue: Secondary | ICD-10-CM | POA: Diagnosis not present

## 2014-01-28 DIAGNOSIS — E663 Overweight: Secondary | ICD-10-CM | POA: Diagnosis not present

## 2014-01-28 DIAGNOSIS — Z0000 Encounter for general adult medical examination without abnormal findings: Secondary | ICD-10-CM | POA: Diagnosis not present

## 2014-01-28 DIAGNOSIS — R5381 Other malaise: Secondary | ICD-10-CM | POA: Diagnosis not present

## 2014-01-28 DIAGNOSIS — N529 Male erectile dysfunction, unspecified: Secondary | ICD-10-CM | POA: Diagnosis not present

## 2014-01-28 DIAGNOSIS — Z125 Encounter for screening for malignant neoplasm of prostate: Secondary | ICD-10-CM | POA: Diagnosis not present

## 2014-01-28 DIAGNOSIS — E782 Mixed hyperlipidemia: Secondary | ICD-10-CM | POA: Diagnosis not present

## 2014-01-28 DIAGNOSIS — Z1331 Encounter for screening for depression: Secondary | ICD-10-CM | POA: Diagnosis not present

## 2014-01-28 DIAGNOSIS — R7309 Other abnormal glucose: Secondary | ICD-10-CM | POA: Diagnosis not present

## 2014-01-28 DIAGNOSIS — G4733 Obstructive sleep apnea (adult) (pediatric): Secondary | ICD-10-CM | POA: Diagnosis not present

## 2014-02-05 DIAGNOSIS — R7309 Other abnormal glucose: Secondary | ICD-10-CM | POA: Diagnosis not present

## 2014-02-05 DIAGNOSIS — R7989 Other specified abnormal findings of blood chemistry: Secondary | ICD-10-CM | POA: Diagnosis not present

## 2014-03-04 DIAGNOSIS — H6690 Otitis media, unspecified, unspecified ear: Secondary | ICD-10-CM | POA: Diagnosis not present

## 2014-03-21 DIAGNOSIS — D2360 Other benign neoplasm of skin of unspecified upper limb, including shoulder: Secondary | ICD-10-CM | POA: Diagnosis not present

## 2014-03-21 DIAGNOSIS — L908 Other atrophic disorders of skin: Secondary | ICD-10-CM | POA: Diagnosis not present

## 2014-03-21 DIAGNOSIS — L918 Other hypertrophic disorders of the skin: Secondary | ICD-10-CM | POA: Diagnosis not present

## 2014-03-21 DIAGNOSIS — L821 Other seborrheic keratosis: Secondary | ICD-10-CM | POA: Diagnosis not present

## 2014-03-21 DIAGNOSIS — D235 Other benign neoplasm of skin of trunk: Secondary | ICD-10-CM | POA: Diagnosis not present

## 2014-03-25 ENCOUNTER — Ambulatory Visit
Admission: RE | Admit: 2014-03-25 | Discharge: 2014-03-25 | Disposition: A | Discharge status: Home / Self Care | Payer: Medicare Other | Source: Ambulatory Visit | Attending: Nurse Practitioner | Admitting: Nurse Practitioner

## 2014-03-25 ENCOUNTER — Other Ambulatory Visit: Payer: Self-pay | Admitting: Nurse Practitioner

## 2014-03-25 DIAGNOSIS — H6690 Otitis media, unspecified, unspecified ear: Secondary | ICD-10-CM

## 2014-03-25 DIAGNOSIS — J329 Chronic sinusitis, unspecified: Secondary | ICD-10-CM | POA: Diagnosis not present

## 2014-03-25 DIAGNOSIS — H9209 Otalgia, unspecified ear: Secondary | ICD-10-CM | POA: Diagnosis not present

## 2014-03-25 DIAGNOSIS — R51 Headache: Secondary | ICD-10-CM | POA: Diagnosis not present

## 2014-03-28 ENCOUNTER — Ambulatory Visit: Payer: BLUE CROSS/BLUE SHIELD | Admitting: *Deleted

## 2014-04-01 DIAGNOSIS — J342 Deviated nasal septum: Secondary | ICD-10-CM | POA: Diagnosis not present

## 2014-04-01 DIAGNOSIS — H60399 Other infective otitis externa, unspecified ear: Secondary | ICD-10-CM | POA: Diagnosis not present

## 2014-04-01 DIAGNOSIS — H6690 Otitis media, unspecified, unspecified ear: Secondary | ICD-10-CM | POA: Diagnosis not present

## 2014-04-03 ENCOUNTER — Encounter: Payer: Medicare Other | Attending: Internal Medicine

## 2014-04-03 VITALS — Ht 70.0 in | Wt 282.0 lb

## 2014-04-03 DIAGNOSIS — Z713 Dietary counseling and surveillance: Secondary | ICD-10-CM | POA: Diagnosis not present

## 2014-04-03 DIAGNOSIS — E119 Type 2 diabetes mellitus without complications: Secondary | ICD-10-CM | POA: Insufficient documentation

## 2014-04-05 NOTE — Progress Notes (Signed)
Patient was seen on 04/03/14 for the first of a series of three diabetes self-management courses at the [facility].  Patient Education Plan per assessed needs and concerns is to attend four course education program for Diabetes Self Management Education.  The following learning objectives were met by the patient during this class:  Describe diabetes  State some common risk factors for diabetes  Defines the role of glucose and insulin  Identifies type of diabetes and pathophysiology  Describe the relationship between diabetes and cardiovascular risk  State the members of the Healthcare Team  States the rationale for glucose monitoring  State when to test glucose  State their individual Target Range  State the importance of logging glucose readings  Describe how to interpret glucose readings  Identifies A1C target  Explain the correlation between A1c and eAG values  State symptoms and treatment of high blood glucose  State symptoms and treatment of low blood glucose  Explain proper technique for glucose testing  Identifies proper sharps disposal  Handouts given during class include:  Living Well with Diabetes book  Carb Counting and Meal Planning book  Meal Plan Card  Carbohydrate guide  Meal planning worksheet  Low Sodium Flavoring Tips  The diabetes portion plate  Z1Q to eAG Conversion Chart  Diabetes Medications  Diabetes Recommended Care Schedule  Support Group  Diabetes Success Plan  Core Class Satisfaction Survey  Follow-Up Plan:  Attend core 2

## 2014-04-10 ENCOUNTER — Encounter: Payer: Medicare Other | Attending: Internal Medicine

## 2014-04-10 DIAGNOSIS — E119 Type 2 diabetes mellitus without complications: Secondary | ICD-10-CM | POA: Diagnosis not present

## 2014-04-10 DIAGNOSIS — Z713 Dietary counseling and surveillance: Secondary | ICD-10-CM | POA: Diagnosis not present

## 2014-04-10 NOTE — Progress Notes (Signed)

## 2014-04-11 ENCOUNTER — Other Ambulatory Visit: Payer: Self-pay | Admitting: Interventional Cardiology

## 2014-04-17 DIAGNOSIS — E119 Type 2 diabetes mellitus without complications: Secondary | ICD-10-CM

## 2014-04-17 NOTE — Progress Notes (Signed)
Patient was seen on 04/17/14 for the third of a series of three diabetes self-management courses at the [facility]. The following learning objectives were met by the patient during this class:    State the amount of activity recommended for healthy living   Describe activities suitable for individual needs   Identify ways to regularly incorporate activity into daily life   Identify barriers to activity and ways to over come these barriers  Identify diabetes medications being personally used and their primary action for lowering glucose and possible side effects   Describe role of stress on blood glucose and develop strategies to address psychosocial issues   Identify diabetes complications and ways to prevent them  Explain how to manage diabetes during illness   Evaluate success in meeting personal goal   Establish 2-3 goals that they will plan to diligently work on until they return for the  34-monthfollow-up visit  Goals:   I will count my carb choices at most meals and snacks  I will be active 5 minutes or more 3 times a week  I will take my diabetes medications as scheduled  Your patient has identified these potential barriers to change:  None identified  Your patient has identified their diabetes self-care support plan as  Family Support  Plan:  Attend Core 4 in 4 months

## 2014-04-30 DIAGNOSIS — Z23 Encounter for immunization: Secondary | ICD-10-CM | POA: Diagnosis not present

## 2014-04-30 DIAGNOSIS — E1165 Type 2 diabetes mellitus with hyperglycemia: Secondary | ICD-10-CM | POA: Diagnosis not present

## 2014-04-30 DIAGNOSIS — E663 Overweight: Secondary | ICD-10-CM | POA: Diagnosis not present

## 2014-04-30 DIAGNOSIS — Z6841 Body Mass Index (BMI) 40.0 and over, adult: Secondary | ICD-10-CM | POA: Diagnosis not present

## 2014-04-30 DIAGNOSIS — I10 Essential (primary) hypertension: Secondary | ICD-10-CM | POA: Diagnosis not present

## 2014-04-30 DIAGNOSIS — E785 Hyperlipidemia, unspecified: Secondary | ICD-10-CM | POA: Diagnosis not present

## 2014-05-01 DIAGNOSIS — I10 Essential (primary) hypertension: Secondary | ICD-10-CM | POA: Diagnosis not present

## 2014-05-01 DIAGNOSIS — E1165 Type 2 diabetes mellitus with hyperglycemia: Secondary | ICD-10-CM | POA: Diagnosis not present

## 2014-05-09 ENCOUNTER — Other Ambulatory Visit: Payer: Self-pay | Admitting: Interventional Cardiology

## 2014-05-15 ENCOUNTER — Other Ambulatory Visit: Payer: Self-pay | Admitting: Interventional Cardiology

## 2014-05-23 ENCOUNTER — Ambulatory Visit: Payer: Medicare Other | Admitting: Interventional Cardiology

## 2014-05-26 ENCOUNTER — Other Ambulatory Visit: Payer: Self-pay | Admitting: *Deleted

## 2014-05-26 MED ORDER — AMLODIPINE BESYLATE 5 MG PO TABS: 5.0000 mg | ORAL_TABLET | Freq: Every day | ORAL | Status: DC

## 2014-06-09 ENCOUNTER — Other Ambulatory Visit: Payer: Self-pay

## 2014-06-09 ENCOUNTER — Other Ambulatory Visit: Payer: Self-pay | Admitting: Interventional Cardiology

## 2014-06-09 MED ORDER — FUROSEMIDE 40 MG PO TABS
40.0000 mg | ORAL_TABLET | Freq: Every day | ORAL | Status: DC
Start: 2014-06-09 — End: 2015-08-24

## 2014-06-10 ENCOUNTER — Other Ambulatory Visit: Payer: Self-pay | Admitting: Interventional Cardiology

## 2014-06-10 ENCOUNTER — Telehealth: Payer: Self-pay

## 2014-06-10 MED ORDER — AMLODIPINE BESYLATE 5 MG PO TABS: 5.0000 mg | ORAL_TABLET | Freq: Every day | ORAL | Status: DC

## 2014-06-10 NOTE — Telephone Encounter (Signed)
refill 

## 2014-06-24 ENCOUNTER — Other Ambulatory Visit: Payer: Self-pay | Admitting: Interventional Cardiology

## 2014-06-25 ENCOUNTER — Other Ambulatory Visit: Payer: Self-pay | Admitting: Interventional Cardiology

## 2014-07-09 ENCOUNTER — Other Ambulatory Visit: Payer: Self-pay | Admitting: Interventional Cardiology

## 2014-07-09 MED ORDER — LISINOPRIL 20 MG PO TABS: 20.0000 mg | ORAL_TABLET | Freq: Every day | ORAL | Status: DC

## 2014-07-22 ENCOUNTER — Encounter: Payer: Self-pay | Admitting: *Deleted

## 2014-07-23 ENCOUNTER — Ambulatory Visit (INDEPENDENT_AMBULATORY_CARE_PROVIDER_SITE_OTHER): Payer: Medicare Other | Admitting: Interventional Cardiology

## 2014-07-23 ENCOUNTER — Encounter: Payer: Self-pay | Admitting: Interventional Cardiology

## 2014-07-23 VITALS — BP 150/78 | HR 51 | Ht 70.0 in | Wt 281.0 lb

## 2014-07-23 DIAGNOSIS — I10 Essential (primary) hypertension: Secondary | ICD-10-CM | POA: Diagnosis not present

## 2014-07-23 DIAGNOSIS — I2510 Atherosclerotic heart disease of native coronary artery without angina pectoris: Secondary | ICD-10-CM | POA: Diagnosis not present

## 2014-07-23 DIAGNOSIS — E119 Type 2 diabetes mellitus without complications: Secondary | ICD-10-CM | POA: Insufficient documentation

## 2014-07-23 DIAGNOSIS — E785 Hyperlipidemia, unspecified: Secondary | ICD-10-CM | POA: Diagnosis not present

## 2014-07-23 NOTE — Progress Notes (Signed)
Patient ID: Kenneth Lee, male   DOB: [birth year], 69 y.o.   MRN: [id number]    [address], [place],   [address] Phone: [phone] Fax:  [phone]  Date:  07/23/2014   ID:  KIETH HARTIS, DOB 18-Oct-1945, MRN [id number]  PCP:  Kandice Hams, MD   ASSESSMENT:  1. Coronary artery disease with bare-metal stent right coronary artery 2008. Asymptomatic with reference to angina 2. Essential hypertension with borderline control 3. Hyperlipidemia 4. Morbid obesity 5. Recently diagnosed diabetes mellitus  PLAN:  1. Recommend weight loss 2. Aerobic activity 3. If any chest discomfort excessive dyspnea, notify us and stressed that should be done 4. Low salt diet   SUBJECTIVE: Kenneth Lee is a 69 y.o. male who is relatively sedentary due to lower back discomfort. He has not been having angina. He denies palpitations, syncope, and limitation in activity due to dyspnea or discomfort.   Wt Readings from Last 3 Encounters:  07/23/14 281 lb (127.461 kg)  04/05/14 282 lb (127.914 kg)  05/21/13 238 lb (107.956 kg)     Past Medical History  Diagnosis Date  . Erectile dysfunction   . Hypertension   . Sleep apnea   . Hyperlipidemia     mixed hyperlidemia  . Goiter   . Other specified cardiac dysrhythmias(427.89)   . Sinoatrial node dysfunction   . Carotid disease, bilateral   . Coronary artery disease     with bare mental stent 2008 and RCA is Card   Dr.Rowyn Spilde  . History of aortoiliofemoral vascular bypass     Dr [name]  . Diabetes mellitus without complication     Current Outpatient Prescriptions  Medication Sig Dispense Refill  . amLODipine (NORVASC) 5 MG tablet Take 1 tablet (5 mg total) by mouth daily. 30 tablet 0  . aspirin EC 325 MG tablet Take 325 mg by mouth as needed.    Marland Kitchen BYSTOLIC 10 MG tablet TAKE 1 TABLET BY MOUTH EVERY DAY 30 tablet 0  . clindamycin (CLEOCIN) 150 MG capsule Take 150 mg by mouth as needed (pRIOR TO DENTAL  PROCEDURES).     . CRESTOR 10 MG tablet TAKE 1 TABLET BY MOUTH EVERY DAY 30 tablet 5  . furosemide (LASIX) 40 MG tablet Take 1 tablet (40 mg total) by mouth daily. 30 tablet 3  . lisinopril (PRINIVIL,ZESTRIL) 20 MG tablet Take 1 tablet (20 mg total) by mouth daily. 90 tablet 3  . metFORMIN (GLUCOPHAGE) 500 MG tablet Take 500 mg by mouth daily.     . nabumetone (RELAFEN) 500 MG tablet Take 500 mg by mouth 2 (two) times daily.     . nitroGLYCERIN (NITRODUR - DOSED IN MG/24 HR) 0.4 mg/hr patch Place 0.4 mg under the tongue as needed.     No current facility-administered medications for this visit.    Allergies:    Allergies  Allergen Reactions  . Penicillins Swelling    Social History:  The patient  reports that he quit smoking about 10 years ago. He does not have any smokeless tobacco history on file. He reports that he drinks alcohol.   ROS:  Please see the history of present illness.   Recent diagnosis of diabetes. Chronic limiting lower back discomfort   All other systems reviewed and negative.   OBJECTIVE: VS:  BP 150/78 mmHg  Pulse 51  Ht 5\' 10"  (1.778 m)  Wt 281 lb (127.461 kg)  BMI 40.32 kg/m2 Well nourished, well developed, in no  acute distress, morbid obesity HEENT: normal Neck: JVD flat. Carotid bruit absent  Cardiac:  normal S1, S2; RRR; no murmur Lungs:  clear to auscultation bilaterally, no wheezing, rhonchi or rales Abd: soft, nontender, no hepatomegaly Ext: Edema absent. Pulses 2+ and symmetric Skin: warm and dry Neuro:  CNs 2-12 intact, no focal abnormalities noted  EKG:  Sinus bradycardia       Signed, Illene Labrador III, MD 07/23/2014 9:16 AM

## 2014-07-23 NOTE — Patient Instructions (Signed)
Your physician recommends that you continue on your current medications as directed. Please refer to the Current Medication list given to you today.  Your physician wants you to follow-up in: 1 year with Dr.Smith You will receive a reminder letter in the mail two months in advance. If you don't receive a letter, please call our office to schedule the follow-up appointment.  

## 2014-07-26 ENCOUNTER — Other Ambulatory Visit: Payer: Self-pay | Admitting: Interventional Cardiology

## 2014-07-28 ENCOUNTER — Other Ambulatory Visit: Payer: Self-pay | Admitting: Interventional Cardiology

## 2014-07-28 NOTE — Telephone Encounter (Signed)
Ok to refill? I do not see where Dr Tamala Julian manages his lipids. Please advise. Thanks, MI

## 2014-08-14 DIAGNOSIS — E1165 Type 2 diabetes mellitus with hyperglycemia: Secondary | ICD-10-CM | POA: Diagnosis not present

## 2014-08-14 DIAGNOSIS — G4730 Sleep apnea, unspecified: Secondary | ICD-10-CM | POA: Diagnosis not present

## 2014-08-14 DIAGNOSIS — E785 Hyperlipidemia, unspecified: Secondary | ICD-10-CM | POA: Diagnosis not present

## 2014-08-14 DIAGNOSIS — I2510 Atherosclerotic heart disease of native coronary artery without angina pectoris: Secondary | ICD-10-CM | POA: Diagnosis not present

## 2014-08-14 DIAGNOSIS — I779 Disorder of arteries and arterioles, unspecified: Secondary | ICD-10-CM | POA: Diagnosis not present

## 2014-08-14 DIAGNOSIS — I10 Essential (primary) hypertension: Secondary | ICD-10-CM | POA: Diagnosis not present

## 2014-09-25 ENCOUNTER — Other Ambulatory Visit: Payer: Self-pay | Admitting: Interventional Cardiology

## 2014-09-29 ENCOUNTER — Other Ambulatory Visit: Payer: Self-pay | Admitting: Interventional Cardiology

## 2014-12-20 ENCOUNTER — Other Ambulatory Visit: Payer: Self-pay | Admitting: Interventional Cardiology

## 2015-01-27 DIAGNOSIS — H60333 Swimmer's ear, bilateral: Secondary | ICD-10-CM | POA: Diagnosis not present

## 2015-01-27 DIAGNOSIS — H6123 Impacted cerumen, bilateral: Secondary | ICD-10-CM | POA: Diagnosis not present

## 2015-04-24 DIAGNOSIS — Z23 Encounter for immunization: Secondary | ICD-10-CM | POA: Diagnosis not present

## 2015-04-24 DIAGNOSIS — M545 Low back pain: Secondary | ICD-10-CM | POA: Diagnosis not present

## 2015-04-27 DIAGNOSIS — E785 Hyperlipidemia, unspecified: Secondary | ICD-10-CM | POA: Diagnosis not present

## 2015-04-27 DIAGNOSIS — Z0001 Encounter for general adult medical examination with abnormal findings: Secondary | ICD-10-CM | POA: Diagnosis not present

## 2015-04-27 DIAGNOSIS — I779 Disorder of arteries and arterioles, unspecified: Secondary | ICD-10-CM | POA: Diagnosis not present

## 2015-04-27 DIAGNOSIS — Z125 Encounter for screening for malignant neoplasm of prostate: Secondary | ICD-10-CM | POA: Diagnosis not present

## 2015-04-27 DIAGNOSIS — M545 Low back pain: Secondary | ICD-10-CM | POA: Diagnosis not present

## 2015-04-27 DIAGNOSIS — I10 Essential (primary) hypertension: Secondary | ICD-10-CM | POA: Diagnosis not present

## 2015-04-27 DIAGNOSIS — Z1389 Encounter for screening for other disorder: Secondary | ICD-10-CM | POA: Diagnosis not present

## 2015-04-27 DIAGNOSIS — E1165 Type 2 diabetes mellitus with hyperglycemia: Secondary | ICD-10-CM | POA: Diagnosis not present

## 2015-04-27 DIAGNOSIS — G4730 Sleep apnea, unspecified: Secondary | ICD-10-CM | POA: Diagnosis not present

## 2015-04-27 DIAGNOSIS — N529 Male erectile dysfunction, unspecified: Secondary | ICD-10-CM | POA: Diagnosis not present

## 2015-04-27 DIAGNOSIS — I2510 Atherosclerotic heart disease of native coronary artery without angina pectoris: Secondary | ICD-10-CM | POA: Diagnosis not present

## 2015-04-28 ENCOUNTER — Other Ambulatory Visit: Payer: Self-pay

## 2015-04-28 MED ORDER — AMLODIPINE BESYLATE 5 MG PO TABS: 5.0000 mg | ORAL_TABLET | Freq: Every day | ORAL | Status: DC

## 2015-04-29 DIAGNOSIS — I10 Essential (primary) hypertension: Secondary | ICD-10-CM | POA: Diagnosis not present

## 2015-04-29 DIAGNOSIS — M4806 Spinal stenosis, lumbar region: Secondary | ICD-10-CM | POA: Diagnosis not present

## 2015-04-29 DIAGNOSIS — Z6837 Body mass index (BMI) 37.0-37.9, adult: Secondary | ICD-10-CM | POA: Diagnosis not present

## 2015-04-30 ENCOUNTER — Other Ambulatory Visit: Payer: Self-pay | Admitting: Neurological Surgery

## 2015-04-30 DIAGNOSIS — M48061 Spinal stenosis, lumbar region without neurogenic claudication: Secondary | ICD-10-CM

## 2015-05-04 ENCOUNTER — Ambulatory Visit
Admission: RE | Admit: 2015-05-04 | Discharge: 2015-05-04 | Disposition: A | Discharge status: Home / Self Care | Payer: Medicare Other | Source: Ambulatory Visit | Attending: Neurological Surgery | Admitting: Neurological Surgery

## 2015-05-04 DIAGNOSIS — M48061 Spinal stenosis, lumbar region without neurogenic claudication: Secondary | ICD-10-CM

## 2015-05-07 ENCOUNTER — Encounter (HOSPITAL_COMMUNITY): Payer: Self-pay | Admitting: *Deleted

## 2015-05-07 ENCOUNTER — Emergency Department (HOSPITAL_COMMUNITY): Payer: Medicare Other

## 2015-05-07 ENCOUNTER — Inpatient Hospital Stay (HOSPITAL_COMMUNITY)
Admission: EM | Admit: 2015-05-07 | Discharge: 2015-05-14 | DRG: 459 | Disposition: A | Discharge status: Home Health | Payer: Medicare Other | Attending: Internal Medicine | Admitting: Internal Medicine

## 2015-05-07 DIAGNOSIS — M545 Low back pain, unspecified: Secondary | ICD-10-CM

## 2015-05-07 DIAGNOSIS — M5116 Intervertebral disc disorders with radiculopathy, lumbar region: Principal | ICD-10-CM | POA: Diagnosis present

## 2015-05-07 DIAGNOSIS — Z87891 Personal history of nicotine dependence: Secondary | ICD-10-CM

## 2015-05-07 DIAGNOSIS — E785 Hyperlipidemia, unspecified: Secondary | ICD-10-CM | POA: Diagnosis not present

## 2015-05-07 DIAGNOSIS — I2510 Atherosclerotic heart disease of native coronary artery without angina pectoris: Secondary | ICD-10-CM | POA: Diagnosis not present

## 2015-05-07 DIAGNOSIS — M4806 Spinal stenosis, lumbar region: Secondary | ICD-10-CM | POA: Diagnosis present

## 2015-05-07 DIAGNOSIS — R609 Edema, unspecified: Secondary | ICD-10-CM

## 2015-05-07 DIAGNOSIS — E875 Hyperkalemia: Secondary | ICD-10-CM | POA: Diagnosis not present

## 2015-05-07 DIAGNOSIS — Z6841 Body Mass Index (BMI) 40.0 and over, adult: Secondary | ICD-10-CM

## 2015-05-07 DIAGNOSIS — I5033 Acute on chronic diastolic (congestive) heart failure: Secondary | ICD-10-CM | POA: Diagnosis not present

## 2015-05-07 DIAGNOSIS — Z7982 Long term (current) use of aspirin: Secondary | ICD-10-CM

## 2015-05-07 DIAGNOSIS — E1151 Type 2 diabetes mellitus with diabetic peripheral angiopathy without gangrene: Secondary | ICD-10-CM | POA: Diagnosis present

## 2015-05-07 DIAGNOSIS — I495 Sick sinus syndrome: Secondary | ICD-10-CM | POA: Diagnosis present

## 2015-05-07 DIAGNOSIS — E871 Hypo-osmolality and hyponatremia: Secondary | ICD-10-CM | POA: Diagnosis not present

## 2015-05-07 DIAGNOSIS — Z880 Allergy status to penicillin: Secondary | ICD-10-CM

## 2015-05-07 DIAGNOSIS — Z7984 Long term (current) use of oral hypoglycemic drugs: Secondary | ICD-10-CM

## 2015-05-07 DIAGNOSIS — M549 Dorsalgia, unspecified: Secondary | ICD-10-CM | POA: Diagnosis present

## 2015-05-07 DIAGNOSIS — G4733 Obstructive sleep apnea (adult) (pediatric): Secondary | ICD-10-CM | POA: Diagnosis present

## 2015-05-07 DIAGNOSIS — I10 Essential (primary) hypertension: Secondary | ICD-10-CM | POA: Diagnosis present

## 2015-05-07 DIAGNOSIS — K5909 Other constipation: Secondary | ICD-10-CM | POA: Diagnosis present

## 2015-05-07 DIAGNOSIS — Z9989 Dependence on other enabling machines and devices: Secondary | ICD-10-CM

## 2015-05-07 DIAGNOSIS — E119 Type 2 diabetes mellitus without complications: Secondary | ICD-10-CM

## 2015-05-07 DIAGNOSIS — I110 Hypertensive heart disease with heart failure: Secondary | ICD-10-CM | POA: Diagnosis present

## 2015-05-07 DIAGNOSIS — E782 Mixed hyperlipidemia: Secondary | ICD-10-CM | POA: Diagnosis present

## 2015-05-07 DIAGNOSIS — M4316 Spondylolisthesis, lumbar region: Secondary | ICD-10-CM | POA: Diagnosis present

## 2015-05-07 DIAGNOSIS — R590 Localized enlarged lymph nodes: Secondary | ICD-10-CM | POA: Diagnosis present

## 2015-05-07 DIAGNOSIS — Z419 Encounter for procedure for purposes other than remedying health state, unspecified: Secondary | ICD-10-CM | POA: Insufficient documentation

## 2015-05-07 DIAGNOSIS — Z955 Presence of coronary angioplasty implant and graft: Secondary | ICD-10-CM

## 2015-05-07 DIAGNOSIS — Z8249 Family history of ischemic heart disease and other diseases of the circulatory system: Secondary | ICD-10-CM

## 2015-05-07 LAB — BASIC METABOLIC PANEL
Anion gap: 9 (ref 5–15)
BUN: 15 mg/dL (ref 6–20)
CO2: 24 mmol/L (ref 22–32)
Calcium: 9.4 mg/dL (ref 8.9–10.3)
Chloride: 95 mmol/L — ABNORMAL LOW (ref 101–111)
Creatinine, Ser: 0.61 mg/dL (ref 0.61–1.24)
GFR calc Af Amer: >60 mL/min (ref >60)
GFR calc non Af Amer: >60 mL/min (ref >60)
Glucose, Bld: 120 mg/dL — ABNORMAL HIGH (ref 65–99)
Potassium: 4.2 mmol/L (ref 3.5–5.1)
Sodium: 128 mmol/L — ABNORMAL LOW (ref 135–145)

## 2015-05-07 LAB — CBC WITH DIFFERENTIAL/PLATELET
Basophils Absolute: 0 10*3/uL (ref 0.0–0.1)
Basophils Relative: 0 %
Eosinophils Absolute: 0.1 10*3/uL (ref 0.0–0.7)
Eosinophils Relative: 1 %
HCT: 44 % (ref 39.0–52.0)
Hemoglobin: 15.3 g/dL (ref 13.0–17.0)
Lymphocytes Relative: 21 %
Lymphs Abs: 2.4 10*3/uL (ref 0.7–4.0)
MCH: 32.1 pg (ref 26.0–34.0)
MCHC: 34.8 g/dL (ref 30.0–36.0)
MCV: 92.2 fL (ref 78.0–100.0)
Monocytes Absolute: 1 10*3/uL (ref 0.1–1.0)
Monocytes Relative: 9 %
Neutro Abs: 7.8 10*3/uL — ABNORMAL HIGH (ref 1.7–7.7)
Neutrophils Relative %: 69 %
Platelets: 170 10*3/uL (ref 150–400)
RBC: 4.77 MIL/uL (ref 4.22–5.81)
RDW: 12.8 % (ref 11.5–15.5)
WBC: 11.2 10*3/uL — ABNORMAL HIGH (ref 4.0–10.5)

## 2015-05-07 LAB — SEDIMENTATION RATE: Sed Rate: 22 mm/hr — ABNORMAL HIGH (ref 0–16)

## 2015-05-07 MED ORDER — LORAZEPAM 2 MG/ML IJ SOLN
2.0000 mg | Freq: Once | INTRAMUSCULAR | Status: AC
  Administered 2015-05-07: 2 mg via INTRAVENOUS
  Filled 2015-05-07: qty 1

## 2015-05-07 MED ORDER — LISINOPRIL 20 MG PO TABS
20.0000 mg | ORAL_TABLET | Freq: Every day | ORAL | Status: DC
  Administered 2015-05-08 – 2015-05-10 (×3): 20 mg via ORAL
  Filled 2015-05-07 (×3): qty 1

## 2015-05-07 MED ORDER — INSULIN ASPART 100 UNIT/ML ~~LOC~~ SOLN
0.0000 [IU] | Freq: Four times a day (QID) | SUBCUTANEOUS | Status: DC
  Administered 2015-05-08 (×2): 1 [IU] via SUBCUTANEOUS

## 2015-05-07 MED ORDER — ACETAMINOPHEN 325 MG PO TABS: 650.0000 mg | ORAL_TABLET | Freq: Four times a day (QID) | ORAL | Status: DC | PRN

## 2015-05-07 MED ORDER — SODIUM CHLORIDE 0.9 % IJ SOLN
3.0000 mL | Freq: Two times a day (BID) | INTRAMUSCULAR | Status: DC
  Administered 2015-05-08 – 2015-05-14 (×11): 3 mL via INTRAVENOUS

## 2015-05-07 MED ORDER — MAGNESIUM HYDROXIDE 400 MG/5ML PO SUSP
15.0000 mL | Freq: Once | ORAL | Status: DC
Start: 2015-05-07 — End: 2015-05-14
  Filled 2015-05-07: qty 30

## 2015-05-07 MED ORDER — CYCLOBENZAPRINE HCL 10 MG PO TABS
10.0000 mg | ORAL_TABLET | Freq: Three times a day (TID) | ORAL | Status: DC | PRN
  Administered 2015-05-11 – 2015-05-14 (×5): 10 mg via ORAL
  Filled 2015-05-07 (×5): qty 1

## 2015-05-07 MED ORDER — HYDROMORPHONE HCL 1 MG/ML IJ SOLN
2.0000 mg | Freq: Once | INTRAMUSCULAR | Status: AC
  Administered 2015-05-07: 2 mg via INTRAVENOUS
  Filled 2015-05-07: qty 2

## 2015-05-07 MED ORDER — DOCUSATE SODIUM 100 MG PO CAPS
100.0000 mg | ORAL_CAPSULE | Freq: Two times a day (BID) | ORAL | Status: DC
  Administered 2015-05-08 – 2015-05-11 (×8): 100 mg via ORAL
  Filled 2015-05-07 (×9): qty 1

## 2015-05-07 MED ORDER — NEBIVOLOL HCL 10 MG PO TABS
10.0000 mg | ORAL_TABLET | Freq: Every day | ORAL | Status: DC
  Administered 2015-05-08 – 2015-05-10 (×3): 10 mg via ORAL
  Filled 2015-05-07 (×4): qty 1

## 2015-05-07 MED ORDER — DIAZEPAM 5 MG PO TABS
10.0000 mg | ORAL_TABLET | Freq: Four times a day (QID) | ORAL | Status: DC | PRN
  Administered 2015-05-09 – 2015-05-14 (×4): 10 mg via ORAL
  Filled 2015-05-07 (×4): qty 2

## 2015-05-07 MED ORDER — AMLODIPINE BESYLATE 5 MG PO TABS
5.0000 mg | ORAL_TABLET | Freq: Every day | ORAL | Status: DC
  Administered 2015-05-08 – 2015-05-10 (×3): 5 mg via ORAL
  Filled 2015-05-07 (×3): qty 1

## 2015-05-07 MED ORDER — ROSUVASTATIN CALCIUM 10 MG PO TABS
10.0000 mg | ORAL_TABLET | Freq: Every day | ORAL | Status: DC
  Administered 2015-05-08 – 2015-05-14 (×7): 10 mg via ORAL
  Filled 2015-05-07 (×7): qty 1

## 2015-05-07 MED ORDER — LORAZEPAM 2 MG/ML IJ SOLN
1.0000 mg | Freq: Once | INTRAMUSCULAR | Status: AC
  Administered 2015-05-07: 1 mg via INTRAVENOUS
  Filled 2015-05-07: qty 1

## 2015-05-07 MED ORDER — POLYETHYLENE GLYCOL 3350 17 G PO PACK
17.0000 g | PACK | Freq: Every day | ORAL | Status: DC
  Administered 2015-05-08: 17 g via ORAL
  Filled 2015-05-07 (×2): qty 1

## 2015-05-07 MED ORDER — MORPHINE SULFATE (PF) 2 MG/ML IV SOLN
2.0000 mg | INTRAVENOUS | Status: DC | PRN
  Administered 2015-05-08: 4 mg via INTRAVENOUS
  Administered 2015-05-08: 2 mg via INTRAVENOUS
  Administered 2015-05-08 – 2015-05-09 (×3): 4 mg via INTRAVENOUS
  Administered 2015-05-09 – 2015-05-10 (×3): 2 mg via INTRAVENOUS
  Administered 2015-05-10: 4 mg via INTRAVENOUS
  Administered 2015-05-11 (×5): 2 mg via INTRAVENOUS
  Filled 2015-05-07 (×5): qty 1
  Filled 2015-05-07: qty 2
  Filled 2015-05-07: qty 1
  Filled 2015-05-07: qty 2
  Filled 2015-05-07: qty 1
  Filled 2015-05-07: qty 2
  Filled 2015-05-07 (×2): qty 1
  Filled 2015-05-07 (×3): qty 2
  Filled 2015-05-07: qty 1

## 2015-05-07 MED ORDER — OXYCODONE HCL 5 MG PO TABS
5.0000 mg | ORAL_TABLET | ORAL | Status: DC | PRN
  Administered 2015-05-08 – 2015-05-11 (×10): 5 mg via ORAL
  Filled 2015-05-07 (×10): qty 1

## 2015-05-07 MED ORDER — ACETAMINOPHEN 650 MG RE SUPP: 650.0000 mg | Freq: Four times a day (QID) | RECTAL | Status: DC | PRN

## 2015-05-07 MED ORDER — BISACODYL 5 MG PO TBEC: 10.0000 mg | DELAYED_RELEASE_TABLET | Freq: Every day | ORAL | Status: DC | PRN

## 2015-05-07 MED ORDER — ONDANSETRON HCL 4 MG PO TABS: 4.0000 mg | ORAL_TABLET | Freq: Four times a day (QID) | ORAL | Status: DC | PRN

## 2015-05-07 MED ORDER — ASPIRIN EC 325 MG PO TBEC
325.0000 mg | DELAYED_RELEASE_TABLET | Freq: Every day | ORAL | Status: DC
  Administered 2015-05-08 – 2015-05-14 (×7): 325 mg via ORAL
  Filled 2015-05-07 (×7): qty 1

## 2015-05-07 MED ORDER — ONDANSETRON HCL 4 MG/2ML IJ SOLN: 4.0000 mg | Freq: Four times a day (QID) | INTRAMUSCULAR | Status: DC | PRN

## 2015-05-07 MED ORDER — FENTANYL CITRATE (PF) 100 MCG/2ML IJ SOLN
100.0000 ug | Freq: Once | INTRAMUSCULAR | Status: AC
  Administered 2015-05-07: 100 ug via INTRAVENOUS
  Filled 2015-05-07: qty 2

## 2015-05-07 MED ORDER — FUROSEMIDE 40 MG PO TABS
40.0000 mg | ORAL_TABLET | Freq: Every day | ORAL | Status: DC
  Administered 2015-05-08 (×2): 40 mg via ORAL
  Filled 2015-05-07 (×2): qty 1

## 2015-05-07 NOTE — ED Notes (Addendum)
Dr. Posey Pronto and respiratory bedside.

## 2015-05-07 NOTE — ED Notes (Signed)
Lab at the bedside 

## 2015-05-07 NOTE — ED Notes (Signed)
Pt is here with severe back pain and has not slept in two weeks, constipated from pain medications.  Pt was unable to get MRI due to pain.  Referred to ED.

## 2015-05-07 NOTE — ED Provider Notes (Signed)
Patient discussed with PA-C, and Dr. Colin Rhein. Care was assumed. I reviewed his MRI discussed this at length with the patient. I reexamined the patient. He is unable tablet independently without new assistance of cane or walker. He does not have localizing neurological loss to suggest acutely herniated nucleus. His MRI shows multilevel degenerative change" acquired spinal stenosis. He has 3 levels of spinal marrow edema with differential including acute inflammatory process chronic inflammatory process, or osteomyelitis. I discussed the case with Dr. Posey Pronto. Patient has had labs obtained including blood cultures and will be admitted.  Tanna Furry, MD 05/07/15 2252

## 2015-05-07 NOTE — ED Provider Notes (Addendum)
CSN: [id number]     Arrival date & time 05/07/15  1157 History   First MD Initiated Contact with Patient 05/07/15 1330     Chief Complaint  Patient presents with  . Back Pain    HPI  [name] is an 69 y.o. male with history of chronic back pain 2/2 L-spine and S1-S2 stenosis and disc issues, HTN, OSA, CAD who presents to the ED for evaluation of back pain. He states his chronic back pain has been getting progressively worse over the past 3 weeks. He is a patient of [name] and had an MRI scheduled for 10/24 but could not have it done due to the extremity of his pain when laying down. States that he has been unable to sleep for weeks due to the pain. He reports he has been taking vicodin which barely helps. Reports he has been constipated (Last BM 5 days ago) from the pain meds and now has abdominal discomfort and mild nausea but denies emesis or abdominal pain. He has been taking Miralax with no relief. He states this has happened to him once before and he had to see a specialist but does not remember what they gave him. Denies any new numbness/tingling, saddle paresthesias, unilateral weakness. He does report bilateral edema that is different than normal and has been having more difficulty walking/standing but that has been progressive over the past couple of weeks.     Past Medical History  Diagnosis Date  . Erectile dysfunction   . Hypertension   . Sleep apnea   . Hyperlipidemia     mixed hyperlidemia  . Goiter   . Other specified cardiac dysrhythmias(427.89)   . Sinoatrial node dysfunction (HCC)   . Carotid disease, bilateral ([place])   . Coronary artery disease     with bare mental stent 2008 and RCA is Card   Dr.Smith  . History of aortoiliofemoral vascular bypass     Dr [name]  . Diabetes mellitus without complication [facility])    Past Surgical History  Procedure Laterality Date  . Cholecystectomy    . Aortic bifemural bypass    . Discectomy     Family History  Problem  Relation Age of Onset  . Heart disease Mother   . Cancer Mother   . Hypertension Other   . Hyperlipidemia Other   . Heart attack Mother   . Stroke Neg Hx   . Hypertension Mother   . Hypertension Father    Social History  Substance Use Topics  . Smoking status: Former Smoker    Quit date: 10/20/2003  . Smokeless tobacco: None  . Alcohol Use: Yes     Comment: occasional    Review of Systems  All other systems reviewed and are negative.     Allergies  Penicillins  Home Medications   Prior to Admission medications   Medication Sig Start Date End Date Taking? Authorizing Provider  amLODipine (NORVASC) 5 MG tablet Take 1 tablet (5 mg total) by mouth daily. 06/10/14  Yes Belva Crome, MD  aspirin EC 325 MG tablet Take 325 mg by mouth daily.    Yes Historical Provider, MD  BYSTOLIC 10 MG tablet TAKE 1 TABLET BY MOUTH EVERY DAY 07/28/14  Yes Belva Crome, MD  CRESTOR 10 MG tablet TAKE 1 TABLET BY MOUTH EVERY DAY 12/22/14  Yes Belva Crome, MD  diazepam (VALIUM) 10 MG tablet Take 10 mg by mouth every 6 (six) hours as needed for anxiety.  Yes Historical Provider, MD  furosemide (LASIX) 40 MG tablet Take 1 tablet (40 mg total) by mouth daily. 06/09/14  Yes Belva Crome, MD  HYDROcodone-acetaminophen [facility]) 10-325 MG tablet Take 1 tablet by mouth every 6 (six) hours as needed for moderate pain.   Yes Historical Provider, MD  lisinopril (PRINIVIL,ZESTRIL) 20 MG tablet Take 1 tablet (20 mg total) by mouth daily. 07/09/14  Yes Belva Crome, MD  metFORMIN (GLUCOPHAGE) 500 MG tablet Take 500 mg by mouth daily.  07/08/14  Yes Historical Provider, MD  nitroGLYCERIN (NITROSTAT) 0.4 MG SL tablet Place 0.4 mg under the tongue every 5 (five) minutes as needed for chest pain.   Yes Historical Provider, MD  amLODipine (NORVASC) 5 MG tablet Take 1 tablet (5 mg total) by mouth daily. Patient not taking: Reported on 05/07/2015 04/28/15   Belva Crome, MD  furosemide (LASIX) 40 MG tablet TAKE 1  TABLET BY MOUTH ONCE DAILY Patient not taking: Reported on 05/07/2015 07/28/14   Belva Crome, MD   BP 157/62 mmHg  Pulse 57  Temp(Src) 98.4 F (36.9 C) (Oral)  Resp 18  SpO2 97% Physical Exam  Constitutional: He is oriented to person, place, and time.  Sitting upright, appears uncomfortable  HENT:  Right Ear: External ear normal.  Left Ear: External ear normal.  Nose: Nose normal.  Mouth/Throat: Oropharynx is clear and moist. No oropharyngeal exudate.  Eyes: Conjunctivae and EOM are normal. Pupils are equal, round, and reactive to light.  Neck: Normal range of motion. Neck supple.  Cardiovascular: Normal rate, regular rhythm, normal heart sounds and intact distal pulses.   No murmur heard. Pulmonary/Chest: Effort normal and breath sounds normal. No respiratory distress. He has no wheezes.  Abdominal: Soft. Bowel sounds are normal. He exhibits no distension. There is no tenderness. There is no rebound and no guarding.  Musculoskeletal:  L-spine tender to palpation. Difficulty walking/standing 2/2 pain. 1-2+ pitting edema bilateral LE. Mildly decreased strength but equal bilaterally.   Lymphadenopathy:    He has no cervical adenopathy.  Neurological: He is alert and oriented to person, place, and time. He has normal reflexes. No cranial nerve deficit.  Skin: Skin is warm and dry. He is not diaphoretic.  Psychiatric: He has a normal mood and affect.  Nursing note and vitals reviewed.   ED Course  Procedures (including critical care time) Labs Review Labs Reviewed - No data to display  Imaging Review No results found. I have personally reviewed and evaluated these images and lab results as part of my medical decision-making.   EKG Interpretation None      MDM   Final diagnoses:  Low back pain    Discussed with Dr. Colin Lee. Although pt has no new red flag symptoms for emergent MRI, he is in a lot of pain and was unable to tolerate outpatient MRI earlier this week. Will  get MRI here so we can give IV meds. Anticipate d/c home with rx for constipation and pt can f/u with [name] pending no major changes on MRI. Will start on dilaudid and ativan. Will likely have to sign out to oncoming team.   Pt signed out to Dr. Jeneen Rinks.     Anne Ng, PA-C 05/07/15 1554   Medical screening examination/treatment/procedure(s) were conducted as a shared visit with non-physician practitioner(s) and myself.  I personally evaluated the patient during the encounter.   EKG Interpretation None       Tanna Furry, MD 05/07/15 2251  Tanna Furry, MD 05/07/15 2251

## 2015-05-07 NOTE — Progress Notes (Signed)
Pt arrived from Ed. Alert and oriented x4. C/O of back pain 7/10. Oriented to room with verbalized understanding. Safety measure in place. Cal bell within reach. Will continue to monitor.

## 2015-05-07 NOTE — ED Notes (Signed)
Dr. Jeneen Rinks back at the bedside to update pt and family

## 2015-05-07 NOTE — ED Notes (Signed)
Patient transported to MRI 

## 2015-05-07 NOTE — ED Notes (Signed)
Pt resting with family at bedside

## 2015-05-07 NOTE — ED Notes (Signed)
Pt off unit for MRI

## 2015-05-08 ENCOUNTER — Observation Stay (HOSPITAL_COMMUNITY): Payer: Medicare Other

## 2015-05-08 DIAGNOSIS — I110 Hypertensive heart disease with heart failure: Secondary | ICD-10-CM | POA: Diagnosis present

## 2015-05-08 DIAGNOSIS — M4326 Fusion of spine, lumbar region: Secondary | ICD-10-CM | POA: Diagnosis not present

## 2015-05-08 DIAGNOSIS — G4733 Obstructive sleep apnea (adult) (pediatric): Secondary | ICD-10-CM | POA: Diagnosis not present

## 2015-05-08 DIAGNOSIS — I2510 Atherosclerotic heart disease of native coronary artery without angina pectoris: Secondary | ICD-10-CM | POA: Diagnosis not present

## 2015-05-08 DIAGNOSIS — M549 Dorsalgia, unspecified: Secondary | ICD-10-CM | POA: Diagnosis not present

## 2015-05-08 DIAGNOSIS — E1151 Type 2 diabetes mellitus with diabetic peripheral angiopathy without gangrene: Secondary | ICD-10-CM | POA: Diagnosis present

## 2015-05-08 DIAGNOSIS — I509 Heart failure, unspecified: Secondary | ICD-10-CM

## 2015-05-08 DIAGNOSIS — R609 Edema, unspecified: Secondary | ICD-10-CM

## 2015-05-08 DIAGNOSIS — E785 Hyperlipidemia, unspecified: Secondary | ICD-10-CM | POA: Diagnosis not present

## 2015-05-08 DIAGNOSIS — I25118 Atherosclerotic heart disease of native coronary artery with other forms of angina pectoris: Secondary | ICD-10-CM | POA: Diagnosis not present

## 2015-05-08 DIAGNOSIS — R590 Localized enlarged lymph nodes: Secondary | ICD-10-CM | POA: Diagnosis present

## 2015-05-08 DIAGNOSIS — M5416 Radiculopathy, lumbar region: Secondary | ICD-10-CM | POA: Diagnosis not present

## 2015-05-08 DIAGNOSIS — M5126 Other intervertebral disc displacement, lumbar region: Secondary | ICD-10-CM | POA: Diagnosis not present

## 2015-05-08 DIAGNOSIS — M545 Low back pain: Secondary | ICD-10-CM | POA: Diagnosis not present

## 2015-05-08 DIAGNOSIS — I495 Sick sinus syndrome: Secondary | ICD-10-CM | POA: Diagnosis present

## 2015-05-08 DIAGNOSIS — M4316 Spondylolisthesis, lumbar region: Secondary | ICD-10-CM | POA: Diagnosis present

## 2015-05-08 DIAGNOSIS — I5033 Acute on chronic diastolic (congestive) heart failure: Secondary | ICD-10-CM | POA: Diagnosis not present

## 2015-05-08 DIAGNOSIS — Z87891 Personal history of nicotine dependence: Secondary | ICD-10-CM | POA: Diagnosis not present

## 2015-05-08 DIAGNOSIS — M5116 Intervertebral disc disorders with radiculopathy, lumbar region: Secondary | ICD-10-CM | POA: Diagnosis not present

## 2015-05-08 DIAGNOSIS — I10 Essential (primary) hypertension: Secondary | ICD-10-CM | POA: Diagnosis not present

## 2015-05-08 DIAGNOSIS — Z7982 Long term (current) use of aspirin: Secondary | ICD-10-CM | POA: Diagnosis not present

## 2015-05-08 DIAGNOSIS — Z8249 Family history of ischemic heart disease and other diseases of the circulatory system: Secondary | ICD-10-CM | POA: Diagnosis not present

## 2015-05-08 DIAGNOSIS — E119 Type 2 diabetes mellitus without complications: Secondary | ICD-10-CM | POA: Diagnosis not present

## 2015-05-08 DIAGNOSIS — Z01810 Encounter for preprocedural cardiovascular examination: Secondary | ICD-10-CM | POA: Diagnosis not present

## 2015-05-08 DIAGNOSIS — Z7984 Long term (current) use of oral hypoglycemic drugs: Secondary | ICD-10-CM | POA: Diagnosis not present

## 2015-05-08 DIAGNOSIS — Z9989 Dependence on other enabling machines and devices: Secondary | ICD-10-CM

## 2015-05-08 DIAGNOSIS — K5909 Other constipation: Secondary | ICD-10-CM | POA: Diagnosis present

## 2015-05-08 DIAGNOSIS — Z955 Presence of coronary angioplasty implant and graft: Secondary | ICD-10-CM | POA: Diagnosis not present

## 2015-05-08 DIAGNOSIS — M4806 Spinal stenosis, lumbar region: Secondary | ICD-10-CM | POA: Diagnosis present

## 2015-05-08 DIAGNOSIS — Z6841 Body Mass Index (BMI) 40.0 and over, adult: Secondary | ICD-10-CM | POA: Diagnosis not present

## 2015-05-08 DIAGNOSIS — Z880 Allergy status to penicillin: Secondary | ICD-10-CM | POA: Diagnosis not present

## 2015-05-08 DIAGNOSIS — E782 Mixed hyperlipidemia: Secondary | ICD-10-CM | POA: Diagnosis present

## 2015-05-08 DIAGNOSIS — E875 Hyperkalemia: Secondary | ICD-10-CM | POA: Diagnosis not present

## 2015-05-08 DIAGNOSIS — E871 Hypo-osmolality and hyponatremia: Secondary | ICD-10-CM | POA: Diagnosis present

## 2015-05-08 LAB — BASIC METABOLIC PANEL
Anion gap: 14 (ref 5–15)
BUN: 15 mg/dL (ref 6–20)
CO2: 22 mmol/L (ref 22–32)
Calcium: 9.8 mg/dL (ref 8.9–10.3)
Chloride: 98 mmol/L — ABNORMAL LOW (ref 101–111)
Creatinine, Ser: 0.72 mg/dL (ref 0.61–1.24)
GFR calc Af Amer: >60 mL/min (ref >60)
GFR calc non Af Amer: >60 mL/min (ref >60)
Glucose, Bld: 135 mg/dL — ABNORMAL HIGH (ref 65–99)
Potassium: 4.3 mmol/L (ref 3.5–5.1)
Sodium: 134 mmol/L — ABNORMAL LOW (ref 135–145)

## 2015-05-08 LAB — COMPREHENSIVE METABOLIC PANEL
ALT: 27 U/L (ref 17–63)
AST: 26 U/L (ref 15–41)
Albumin: 3.7 g/dL (ref 3.5–5.0)
Alkaline Phosphatase: 77 U/L (ref 38–126)
Anion gap: 9 (ref 5–15)
BUN: 15 mg/dL (ref 6–20)
CO2: 27 mmol/L (ref 22–32)
Calcium: 9.6 mg/dL (ref 8.9–10.3)
Chloride: 95 mmol/L — ABNORMAL LOW (ref 101–111)
Creatinine, Ser: 0.86 mg/dL (ref 0.61–1.24)
GFR calc Af Amer: >60 mL/min (ref >60)
GFR calc non Af Amer: >60 mL/min (ref >60)
Glucose, Bld: 129 mg/dL — ABNORMAL HIGH (ref 65–99)
Potassium: 4.5 mmol/L (ref 3.5–5.1)
Sodium: 131 mmol/L — ABNORMAL LOW (ref 135–145)
Total Bilirubin: 0.5 mg/dL (ref 0.3–1.2)
Total Protein: 6.7 g/dL (ref 6.5–8.1)

## 2015-05-08 LAB — C-REACTIVE PROTEIN: CRP: 1.1 mg/dL — ABNORMAL HIGH (ref <1.0)

## 2015-05-08 LAB — GLUCOSE, CAPILLARY
Glucose-Capillary: 113 mg/dL — ABNORMAL HIGH (ref 65–99)
Glucose-Capillary: 125 mg/dL — ABNORMAL HIGH (ref 65–99)
Glucose-Capillary: 129 mg/dL — ABNORMAL HIGH (ref 65–99)
Glucose-Capillary: 152 mg/dL — ABNORMAL HIGH (ref 65–99)

## 2015-05-08 LAB — CBC WITH DIFFERENTIAL/PLATELET
Basophils Absolute: 0 10*3/uL (ref 0.0–0.1)
Basophils Relative: 0 %
Eosinophils Absolute: 0.1 10*3/uL (ref 0.0–0.7)
Eosinophils Relative: 1 %
HCT: 44.2 % (ref 39.0–52.0)
Hemoglobin: 14.5 g/dL (ref 13.0–17.0)
Lymphocytes Relative: 24 %
Lymphs Abs: 2.6 10*3/uL (ref 0.7–4.0)
MCH: 31 pg (ref 26.0–34.0)
MCHC: 32.8 g/dL (ref 30.0–36.0)
MCV: 94.4 fL (ref 78.0–100.0)
Monocytes Absolute: 0.7 10*3/uL (ref 0.1–1.0)
Monocytes Relative: 7 %
Neutro Abs: 7.6 10*3/uL (ref 1.7–7.7)
Neutrophils Relative %: 68 %
Platelets: 164 10*3/uL (ref 150–400)
RBC: 4.68 MIL/uL (ref 4.22–5.81)
RDW: 13.2 % (ref 11.5–15.5)
WBC: 11.1 10*3/uL — ABNORMAL HIGH (ref 4.0–10.5)

## 2015-05-08 LAB — BRAIN NATRIURETIC PEPTIDE: B Natriuretic Peptide: 12.3 pg/mL (ref 0.0–100.0)

## 2015-05-08 LAB — PROTIME-INR
INR: 1.17 (ref 0.00–1.49)
Prothrombin Time: 15 seconds (ref 11.6–15.2)

## 2015-05-08 LAB — TSH: TSH: 2.066 u[IU]/mL (ref 0.350–4.500)

## 2015-05-08 MED ORDER — POTASSIUM CHLORIDE CRYS ER 20 MEQ PO TBCR
20.0000 meq | EXTENDED_RELEASE_TABLET | Freq: Every day | ORAL | Status: DC
  Administered 2015-05-08 – 2015-05-11 (×4): 20 meq via ORAL
  Filled 2015-05-08 (×4): qty 1

## 2015-05-08 MED ORDER — DEXAMETHASONE SODIUM PHOSPHATE 4 MG/ML IJ SOLN
4.0000 mg | Freq: Three times a day (TID) | INTRAMUSCULAR | Status: DC
  Administered 2015-05-08 – 2015-05-12 (×11): 4 mg via INTRAVENOUS
  Filled 2015-05-08 (×11): qty 1

## 2015-05-08 MED ORDER — FUROSEMIDE 10 MG/ML IJ SOLN: 40.0000 mg | Freq: Every day | INTRAMUSCULAR | Status: DC

## 2015-05-08 MED ORDER — INSULIN ASPART 100 UNIT/ML ~~LOC~~ SOLN
0.0000 [IU] | Freq: Three times a day (TID) | SUBCUTANEOUS | Status: DC
  Administered 2015-05-08: 3 [IU] via SUBCUTANEOUS
  Administered 2015-05-09: 5 [IU] via SUBCUTANEOUS
  Administered 2015-05-09: 2 [IU] via SUBCUTANEOUS
  Administered 2015-05-09 – 2015-05-10 (×2): 3 [IU] via SUBCUTANEOUS
  Administered 2015-05-10: 2 [IU] via SUBCUTANEOUS
  Administered 2015-05-10: 8 [IU] via SUBCUTANEOUS
  Administered 2015-05-11: 2 [IU] via SUBCUTANEOUS
  Administered 2015-05-11 – 2015-05-12 (×4): 3 [IU] via SUBCUTANEOUS
  Administered 2015-05-13: 2 [IU] via SUBCUTANEOUS
  Administered 2015-05-13: 3 [IU] via SUBCUTANEOUS
  Administered 2015-05-13 – 2015-05-14 (×2): 2 [IU] via SUBCUTANEOUS

## 2015-05-08 MED ORDER — ACETAMINOPHEN 500 MG PO TABS
1000.0000 mg | ORAL_TABLET | Freq: Three times a day (TID) | ORAL | Status: DC
  Administered 2015-05-08 – 2015-05-12 (×5): 1000 mg via ORAL
  Filled 2015-05-08 (×10): qty 2

## 2015-05-08 MED ORDER — ENOXAPARIN SODIUM 40 MG/0.4ML ~~LOC~~ SOLN
40.0000 mg | SUBCUTANEOUS | Status: DC
  Administered 2015-05-08 – 2015-05-13 (×5): 40 mg via SUBCUTANEOUS
  Filled 2015-05-08 (×5): qty 0.4

## 2015-05-08 MED ORDER — POLYETHYLENE GLYCOL 3350 17 G PO PACK
17.0000 g | PACK | Freq: Two times a day (BID) | ORAL | Status: DC
  Administered 2015-05-08 – 2015-05-12 (×4): 17 g via ORAL
  Filled 2015-05-08 (×6): qty 1

## 2015-05-08 MED ORDER — FUROSEMIDE 10 MG/ML IJ SOLN
40.0000 mg | Freq: Every day | INTRAMUSCULAR | Status: DC
  Administered 2015-05-08 – 2015-05-12 (×5): 40 mg via INTRAVENOUS
  Filled 2015-05-08 (×5): qty 4

## 2015-05-08 NOTE — Care Management Note (Signed)
Case Management Note  Patient Details  Name: OWENS HARA MRN: [id number] Date of Birth: September 07, 1945  Subjective/Objective:                    Action/Plan: Patient admitted with back pain. Patient lives at home alone. Awaiting Neurosurgery consult and PT/OT recommendations. CM will continue to follow for discharge needs.   Expected Discharge Date:                  Expected Discharge Plan:     In-House Referral:     Discharge planning Services     Post Acute Care Choice:    Choice offered to:     DME Arranged:    DME Agency:     HH Arranged:    [place] Agency:     Status of Service:     Medicare Important Message Given:    Date Medicare IM Given:    Medicare IM give by:    Date Additional Medicare IM Given:    Additional Medicare Important Message give by:     If discussed at [place] of Stay Meetings, dates discussed:    Additional Comments:  Pollie Friar, RN 05/08/2015, 11:28 AM

## 2015-05-08 NOTE — Consult Note (Signed)
CARDIOLOGY CONSULT NOTE     Patient ID: Kenneth Lee MRN: [id number] DOB/AGE: Mar 06, 1946 69 y.o.  Admit date: 05/07/2015 Referring Physician Oren Binet MD Primary Physician Kandice Hams, MD Primary Cardiologist Daneen Schick MD Reason for Consultation pre op cardiac evaluation  HPI: Pleasant 69 yo WM admitted with severe back pain. Pain is progressive over last 6 weeks. Activity very limited. Pain radiates down both legs. Unable to sleep lying down. Was given a Medrol dose pack without improvement. MRI shows severe degenerative arthritis with spondylolisthesis L34 and L45 with large disc herniation.  Patient has a known history of CAD and is s/p bare metal stent to the proximal RCA in 2008. Otherwise nonobstructive disease. No chest pain since then. Regular follow up visits with Dr. Tamala Julian. No history of CHF. This summer was able to walk and play golf without angina or SOB. More recent activity limited by back pain. Over past 5 days has noted increased swelling in legs but no orthopnea or PND.   Past Medical History  Diagnosis Date  . Erectile dysfunction   . Hypertension   . Sleep apnea   . Hyperlipidemia     mixed hyperlidemia  . Goiter   . Other specified cardiac dysrhythmias(427.89)   . Sinoatrial node dysfunction (HCC)   . Carotid disease, bilateral ([place])   . Coronary artery disease     with bare mental stent 2008 and RCA is Card   Dr.Smith  . History of aortoiliofemoral vascular bypass     Dr [name]  . Diabetes mellitus without complication ([place])     Family History  Problem Relation Age of Onset  . Heart disease Mother   . Cancer Mother   . Hypertension Other   . Hyperlipidemia Other   . Heart attack Mother   . Stroke Neg Hx   . Hypertension Mother   . Hypertension Father     Social History   Social History  . Marital Status: Married    Spouse Name: N/A  . Number of Children: N/A  . Years of Education: N/A   Occupational History  . Not on file.    Social History Main Topics  . Smoking status: Former Smoker    Quit date: 10/20/2003  . Smokeless tobacco: Not on file  . Alcohol Use: Yes     Comment: occasional  . Drug Use: No  . Sexual Activity: Not on file   Other Topics Concern  . Not on file   Social History Narrative    Past Surgical History  Procedure Laterality Date  . Cholecystectomy    . Aortic bifemural bypass    . Discectomy       Prescriptions prior to admission  Medication Sig Dispense Refill Last Dose  . amLODipine (NORVASC) 5 MG tablet Take 1 tablet (5 mg total) by mouth daily. 30 tablet 0 05/07/2015 at Unknown time  . aspirin EC 325 MG tablet Take 325 mg by mouth daily.    05/07/2015 at Unknown time  . BYSTOLIC 10 MG tablet TAKE 1 TABLET BY MOUTH EVERY DAY 30 tablet 11 05/07/2015 at 0800  . CRESTOR 10 MG tablet TAKE 1 TABLET BY MOUTH EVERY DAY 30 tablet 11 05/06/2015 at Unknown time  . diazepam (VALIUM) 10 MG tablet Take 10 mg by mouth every 6 (six) hours as needed for anxiety.   05/06/2015 at Unknown time  . furosemide (LASIX) 40 MG tablet Take 1 tablet (40 mg total) by mouth daily. 30 tablet 3 05/07/2015 at Unknown  time  . HYDROcodone-acetaminophen (NORCO) 10-325 MG tablet Take 1 tablet by mouth every 6 (six) hours as needed for moderate pain.   05/07/2015 at Unknown time  . lisinopril (PRINIVIL,ZESTRIL) 20 MG tablet Take 1 tablet (20 mg total) by mouth daily. 90 tablet 3 05/07/2015 at Unknown time  . metFORMIN (GLUCOPHAGE) 500 MG tablet Take 500 mg by mouth daily.    05/07/2015 at Unknown time  . nitroGLYCERIN (NITROSTAT) 0.4 MG SL tablet Place 0.4 mg under the tongue every 5 (five) minutes as needed for chest pain.   unknown at unknown  . amLODipine (NORVASC) 5 MG tablet Take 1 tablet (5 mg total) by mouth daily. (Patient not taking: Reported on 05/07/2015) 90 tablet 0   . furosemide (LASIX) 40 MG tablet TAKE 1 TABLET BY MOUTH ONCE DAILY (Patient not taking: Reported on 05/07/2015) 30 tablet 11       ROS: As noted in HPI. All other systems are reviewed and are negative unless otherwise mentioned.   Physical Exam: Blood pressure 162/76, pulse 57, temperature 97.5 F (36.4 C), temperature source Oral, resp. rate 20, height 6' (1.829 m), weight 127.4 kg (280 lb 13.9 oz), SpO2 98 %. Current Weight  05/08/15 127.4 kg (280 lb 13.9 oz)  07/23/14 127.461 kg (281 lb)  04/05/14 127.914 kg (282 lb)   GENERAL:  Well appearing, obese WM  HEENT:  PERRL, EOMI, sclera are clear. Oropharynx is clear. NECK:  No jugular venous distention, carotid upstroke brisk and symmetric, no bruits, no thyromegaly or adenopathy LUNGS:  Clear to auscultation bilaterally CHEST:  Unremarkable HEART:  RRR,  PMI not displaced or sustained,S1 and S2 within normal limits, no S3, no S4: no clicks, no rubs, no murmurs ABD:  Soft, nontender. BS +, no masses or bruits. No hepatomegaly, no splenomegaly EXT:  2 + pulses throughout, 1-2+ edema, no cyanosis no clubbing SKIN:  Warm and dry.  No rashes NEURO:  Alert and oriented x 3. Cranial nerves II through XII intact. PSYCH:  Cognitively intact     Labs:   Lab Results  Component Value Date   WBC 11.1* 05/08/2015   HGB 14.5 05/08/2015   HCT 44.2 05/08/2015   MCV 94.4 05/08/2015   PLT 164 05/08/2015    Recent Labs Lab 05/08/15 0630  NA 131*  K 4.5  CL 95*  CO2 27  BUN 15  CREATININE 0.86  CALCIUM 9.6  PROT 6.7  BILITOT 0.5  ALKPHOS 77  ALT 27  AST 26  GLUCOSE 129*   Lab Results  Component Value Date   CKTOTAL 290* 03/29/2007   CKTOTAL 311* 03/29/2007   CKMB 10.7* 03/29/2007   CKMB 12.3* 03/29/2007   TROPONINI 0.02        NO INDICATION OF MYOCARDIAL INJURY. 03/29/2007   Lab Results  Component Value Date   CHOL  03/29/2007    194        ATP III CLASSIFICATION:  <200     mg/dL   Desirable  200-239  mg/dL   Borderline High  >=240    mg/dL   High   Lab Results  Component Value Date   HDL 39* 03/29/2007   Lab Results  Component Value  Date   LDLCALC * 03/29/2007    127        Total Cholesterol/HDL:CHD Risk Coronary Heart Disease Risk Table                     Men   Women  1/2 Average Risk   3.4   3.3   Lab Results  Component Value Date   TRIG 140 03/29/2007   Lab Results  Component Value Date   CHOLHDL 5.0 03/29/2007   No results found for: LDLDIRECT  No results found for: PROBNP Lab Results  Component Value Date   TSH 2.066 05/08/2015   No results found for: HGBA1C  Radiology: Mr Lumbar Spine Wo Contrast  05/07/2015  CLINICAL DATA:  69 year old male with lumbar back pain, chronic but progressive x3 weeks. Severe pain, unable to have a outpatient lumbar MRI. Subsequent encounter. EXAM: MRI LUMBAR SPINE WITHOUT CONTRAST TECHNIQUE: Multiplanar, multisequence MR imaging of the lumbar spine was performed. No intravenous contrast was administered. COMPARISON:  [facility] lumbar radiographs 04/29/2015. [facility] lumbar MRI 02/26/2012. [facility] lumbar MRI 07/14/2009. FINDINGS: The examination had to be discontinued prior to completion due to pain. Levoconvex lumbar scoliosis demonstrated on the recent radiographs. Normal lumbar segmentation, and this corresponds to the numbering system used on the 2011 comparison. Since 2011 anterolisthesis at L3-L4 and retrolisthesis at L4-L5 have progressed. Severe disc space loss at both levels has progressed. Trace vacuum disc anteriorly at L3-L4 on the recent radiographs. Progressed endplate spurring and sclerosis, maximal at L4-L5. On the sagittal STIR imaging today there is confluent STIR hyperintensity in the L3, L4, and L5 vertebral bodies mostly along the endplates. No suspicious increased T2 or STIR signal identified within the intervening disc spaces. No definite prevertebral/paraspinal STIR hyperintensity. Visualized lower thoracic spinal cord is normal with conus medularis at L1-L2. 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 T12-L1:  Negative.  L1-L2: Circumferential disc bulge. Borderline to mild spinal stenosis. L2-L3: Circumferential disc osteophyte complex and mild to moderate facet and ligament flavum hypertrophy. Mild to moderate spinal stenosis, not significantly changed since 2011. L3-L4: Bulky circumferential disc osteophyte complex and progressed severe facet hypertrophy. Severe spinal stenosis here appears increased since 2011. The sagittal images suggest a disc extrusion, sequestered fragment contributing to the appearance. See series 3, image 7 and series 8, image 20. The fragment measures up to 13 mm largest dimension. L4-L5: Severe disc space loss and circumferential disc osteophyte complex. Chronic moderate facet hypertrophy. Still, right lateral recess patency appears improved since 2011. Chronic moderate right greater than left foraminal stenosis appears stable and mostly related to endplate spurring. L5-S1: Left eccentric chronic circumferential disc osteophyte complex appears stable since 2011 along with moderate facet hypertrophy. Moderate left greater than right L5 foraminal stenosis appears stable. IMPRESSION: 1. Prematurely discontinued and motion degraded study despite maximal patient medication and multiple imaging attempts. 2. Severe spinal stenosis at L3-L4 appears progressed since 2011 and in part related to a sequestered disc fragment best seen on series 3, image 7. Grade 1 anterolisthesis also has progressed at this level along with severe facet hypertrophy. 3. Confluent marrow edema in the L3, L4, and L5 vertebral bodies is favored to be degenerative in nature owing to the progressed and now severe disc degeneration at these levels. Infection is felt less likely but in light of the patient's diabetes, blood cultures and correlation with serologic markers of infection are recommended. Electronically Signed   By: Genevie Ann M.D.   On: 05/07/2015 19:52    EKG: Pending  Echo: pending  ASSESSMENT AND PLAN:  1. CAD. Remote  stenting of the RCA. No active anginal symptoms. Overall I think his cardiac risk is low for noncardiac surgery. Will check Ecg. Check Echo. If Ecg is unchanged and Echo  shows LV dysfunction then I would proceed with back surgery without further work up. If Echo shows reduced LV function we may consider a Lexiscan myoview study this weekend. 2. Edema. I suspect this is related to recent steroids and increased leg dependency since he is unable to lie down.  3. Severe arthritis of spine with disc protrusion. 4. HTN  5. PAD s/p aortobifemoral bypass 6. Hyperlipidemia on statin.   Signed: Chania Kochanski [place], [place]  05/08/2015, 12:46 PM

## 2015-05-08 NOTE — Progress Notes (Signed)
PT Cancellation Note  Patient Details Name: ALPHONSE ASBRIDGE MRN: [id number] DOB: May 14, 1946   Cancelled Treatment:    Reason Eval/Treat Not Completed: Patient sleeping soundly and his RN requested to not awaken him. She stated he has been hurting, uncomfortable all day and is finally resting. Noted likely for surgery next week. Will reattempt 10/29 as appropriate  Fallon Haecker 05/08/2015, 3:21 PM Pager [phone]

## 2015-05-08 NOTE — Consult Note (Signed)
Reason for Consult:low back and bilateral lower extremity pain and weakness Referring Physician: Shannon Lee is an 69 y.o. male.  HPI: Kenneth Lee is a 69 y.o. male with Past medical history of spinal stenosis, hypertension, dyslipidemia, erectile dysfunction, coronary artery disease, obstructive sleep apnea on C Pap. The patient is presenting with complaints of progressively worsening back pain for last 10 days. The patient was seen by [name] as an outpatient and was planned to get an 10/27. Because of his severe back pain the patient was unable to get the MRI and then was referred to ER. After receiving multiple medication the patient did complete the MRI, although he was not able to complete all sequences. Patient continues to complain of severe back pain and has not been able to ambulate at all for last 3-4 days. Patient complains of weakness with inability to go up and down stairs and also notes numbness in his legs. Patient denies any loss of control of bowel or bladder. Patient mentions he has not had any bowel movement for last 5 days. Patient complains of swelling in his legs ongoing for last 6 days.  He has been unable to lay flat and has been resting in a recliner. Denies having any chest pain or shortness of breath no fever no chills no nausea no vomiting. He denies any recent change in his medications. He also denies any fall trauma or injury.  The patient was admitted to the Hospitalist service from the ER.  At his baseline ambulates without support And is independent for most of his ADL; manages his medication on his own.  Past Medical History  Diagnosis Date  . Erectile dysfunction   . Hypertension   . Sleep apnea   . Hyperlipidemia     mixed hyperlidemia  . Goiter   . Other specified cardiac dysrhythmias(427.89)   . Sinoatrial node dysfunction (HCC)   . Carotid disease, bilateral ([place])   . Coronary artery disease     with bare mental stent  2008 and RCA is Card   Dr.Smith  . History of aortoiliofemoral vascular bypass     Dr [name]  . Diabetes mellitus without complication [facility])     Past Surgical History  Procedure Laterality Date  . Cholecystectomy    . Aortic bifemural bypass    . Discectomy      Family History  Problem Relation Age of Onset  . Heart disease Mother   . Cancer Mother   . Hypertension Other   . Hyperlipidemia Other   . Heart attack Mother   . Stroke Neg Hx   . Hypertension Mother   . Hypertension Father     Social History:  reports that he quit smoking about 11 years ago. He does not have any smokeless tobacco history on file. He reports that he drinks alcohol. He reports that he does not use illicit drugs.  Allergies:  Allergies  Allergen Reactions  . Penicillins Swelling    Has patient had a PCN reaction causing immediate rash, facial/tongue/throat swelling, SOB or lightheadedness with hypotension: No Has patient had a PCN reaction causing severe rash involving mucus membranes or skin necrosis: No Has patient had a PCN reaction that required hospitalization No Has patient had a PCN reaction occurring within the last 10 years: No If all of the above answers are "NO", then may proceed with Cephalosporin use.    Medications: I have reviewed the patient's current medications.  Results for orders placed or  performed during the hospital encounter of 05/07/15 (from the past 48 hour(s))  Basic metabolic panel     Status: Abnormal   Collection Time: 05/07/15  9:56 PM  Result Value Ref Range   Sodium 128 (L) 135 - 145 mmol/L   Potassium 4.2 3.5 - 5.1 mmol/L   Chloride 95 (L) 101 - 111 mmol/L   CO2 24 22 - 32 mmol/L   Glucose, Bld 120 (H) 65 - 99 mg/dL   BUN 15 6 - 20 mg/dL   Creatinine, Ser 0.61 0.61 - 1.24 mg/dL   Calcium 9.4 8.9 - 10.3 mg/dL   GFR calc non Af Amer >60 >60 mL/min   GFR calc Af Amer >60 >60 mL/min    Comment: (NOTE) The eGFR has been calculated using the CKD EPI  equation. This calculation has not been validated in all clinical situations. eGFR's persistently <60 mL/min signify possible Chronic Kidney Disease.    Anion gap 9 5 - 15  CBC with Differential/Platelet     Status: Abnormal   Collection Time: 05/07/15  9:56 PM  Result Value Ref Range   WBC 11.2 (H) 4.0 - 10.5 K/uL   RBC 4.77 4.22 - 5.81 MIL/uL   Hemoglobin 15.3 13.0 - 17.0 g/dL   HCT 44.0 39.0 - 52.0 %   MCV 92.2 78.0 - 100.0 fL   MCH 32.1 26.0 - 34.0 pg   MCHC 34.8 30.0 - 36.0 g/dL   RDW 12.8 11.5 - 15.5 %   Platelets 170 150 - 400 K/uL   Neutrophils Relative % 69 %   Neutro Abs 7.8 (H) 1.7 - 7.7 K/uL   Lymphocytes Relative 21 %   Lymphs Abs 2.4 0.7 - 4.0 K/uL   Monocytes Relative 9 %   Monocytes Absolute 1.0 0.1 - 1.0 K/uL   Eosinophils Relative 1 %   Eosinophils Absolute 0.1 0.0 - 0.7 K/uL   Basophils Relative 0 %   Basophils Absolute 0.0 0.0 - 0.1 K/uL  Sedimentation rate     Status: Abnormal   Collection Time: 05/07/15  9:56 PM  Result Value Ref Range   Sed Rate 22 (H) 0 - 16 mm/hr  Basic metabolic panel     Status: Abnormal   Collection Time: 05/08/15 12:17 AM  Result Value Ref Range   Sodium 134 (L) 135 - 145 mmol/L   Potassium 4.3 3.5 - 5.1 mmol/L   Chloride 98 (L) 101 - 111 mmol/L   CO2 22 22 - 32 mmol/L   Glucose, Bld 135 (H) 65 - 99 mg/dL   BUN 15 6 - 20 mg/dL   Creatinine, Ser 0.72 0.61 - 1.24 mg/dL   Calcium 9.8 8.9 - 10.3 mg/dL   GFR calc non Af Amer >60 >60 mL/min   GFR calc Af Amer >60 >60 mL/min    Comment: (NOTE) The eGFR has been calculated using the CKD EPI equation. This calculation has not been validated in all clinical situations. eGFR's persistently <60 mL/min signify possible Chronic Kidney Disease.    Anion gap 14 5 - 15  TSH     Status: None   Collection Time: 05/08/15 12:17 AM  Result Value Ref Range   TSH 2.066 0.350 - 4.500 uIU/mL  Brain natriuretic peptide     Status: None   Collection Time: 05/08/15 12:17 AM  Result Value Ref  Range   B Natriuretic Peptide 12.3 0.0 - 100.0 pg/mL  C-reactive protein     Status: Abnormal   Collection Time: 05/08/15  12:17 AM  Result Value Ref Range   CRP 1.1 (H) <1.0 mg/dL  Protime-INR     Status: None   Collection Time: 05/08/15 12:17 AM  Result Value Ref Range   Prothrombin Time 15.0 11.6 - 15.2 seconds   INR 1.17 0.00 - 1.49  Glucose, capillary     Status: Abnormal   Collection Time: 05/08/15 12:18 AM  Result Value Ref Range   Glucose-Capillary 125 (H) 65 - 99 mg/dL   Comment 1 Notify RN    Comment 2 Document in Chart   Glucose, capillary     Status: Abnormal   Collection Time: 05/08/15  6:13 AM  Result Value Ref Range   Glucose-Capillary 129 (H) 65 - 99 mg/dL   Comment 1 Notify RN    Comment 2 Document in Chart   CBC with Differential/Platelet     Status: Abnormal   Collection Time: 05/08/15  6:30 AM  Result Value Ref Range   WBC 11.1 (H) 4.0 - 10.5 K/uL   RBC 4.68 4.22 - 5.81 MIL/uL   Hemoglobin 14.5 13.0 - 17.0 g/dL   HCT 44.2 39.0 - 52.0 %   MCV 94.4 78.0 - 100.0 fL   MCH 31.0 26.0 - 34.0 pg   MCHC 32.8 30.0 - 36.0 g/dL   RDW 13.2 11.5 - 15.5 %   Platelets 164 150 - 400 K/uL   Neutrophils Relative % 68 %   Neutro Abs 7.6 1.7 - 7.7 K/uL   Lymphocytes Relative 24 %   Lymphs Abs 2.6 0.7 - 4.0 K/uL   Monocytes Relative 7 %   Monocytes Absolute 0.7 0.1 - 1.0 K/uL   Eosinophils Relative 1 %   Eosinophils Absolute 0.1 0.0 - 0.7 K/uL   Basophils Relative 0 %   Basophils Absolute 0.0 0.0 - 0.1 K/uL  Comprehensive metabolic panel     Status: Abnormal   Collection Time: 05/08/15  6:30 AM  Result Value Ref Range   Sodium 131 (L) 135 - 145 mmol/L   Potassium 4.5 3.5 - 5.1 mmol/L   Chloride 95 (L) 101 - 111 mmol/L   CO2 27 22 - 32 mmol/L   Glucose, Bld 129 (H) 65 - 99 mg/dL   BUN 15 6 - 20 mg/dL   Creatinine, Ser 0.86 0.61 - 1.24 mg/dL   Calcium 9.6 8.9 - 10.3 mg/dL   Total Protein 6.7 6.5 - 8.1 g/dL   Albumin 3.7 3.5 - 5.0 g/dL   AST 26 15 - 41 U/L   ALT  27 17 - 63 U/L   Alkaline Phosphatase 77 38 - 126 U/L   Total Bilirubin 0.5 0.3 - 1.2 mg/dL   GFR calc non Af Amer >60 >60 mL/min   GFR calc Af Amer >60 >60 mL/min    Comment: (NOTE) The eGFR has been calculated using the CKD EPI equation. This calculation has not been validated in all clinical situations. eGFR's persistently <60 mL/min signify possible Chronic Kidney Disease.    Anion gap 9 5 - 15    Mr Lumbar Spine Wo Contrast  05/07/2015  CLINICAL DATA:  69 year old male with lumbar back pain, chronic but progressive x3 weeks. Severe pain, unable to have a outpatient lumbar MRI. Subsequent encounter. EXAM: MRI LUMBAR SPINE WITHOUT CONTRAST TECHNIQUE: Multiplanar, multisequence MR imaging of the lumbar spine was performed. No intravenous contrast was administered. COMPARISON:  [facility] lumbar radiographs 04/29/2015. [facility] 02/26/2012. [facility] lumbar MRI 07/14/2009. FINDINGS: The examination had to  be discontinued prior to completion due to pain. Levoconvex lumbar scoliosis demonstrated on the recent radiographs. Normal lumbar segmentation, and this corresponds to the numbering system used on the 2011 comparison. Since 2011 anterolisthesis at L3-L4 and retrolisthesis at L4-L5 have progressed. Severe disc space loss at both levels has progressed. Trace vacuum disc anteriorly at L3-L4 on the recent radiographs. Progressed endplate spurring and sclerosis, maximal at L4-L5. On the sagittal STIR imaging today there is confluent STIR hyperintensity in the L3, L4, and L5 vertebral bodies mostly along the endplates. No suspicious increased T2 or STIR signal identified within the intervening disc spaces. No definite prevertebral/paraspinal STIR hyperintensity. Visualized lower thoracic spinal cord is normal with conus medularis at L1-L2. 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 T12-L1:  Negative. L1-L2: Circumferential disc bulge. Borderline to mild spinal  stenosis. L2-L3: Circumferential disc osteophyte complex and mild to moderate facet and ligament flavum hypertrophy. Mild to moderate spinal stenosis, not significantly changed since 2011. L3-L4: Bulky circumferential disc osteophyte complex and progressed severe facet hypertrophy. Severe spinal stenosis here appears increased since 2011. The sagittal images suggest a disc extrusion, sequestered fragment contributing to the appearance. See series 3, image 7 and series 8, image 20. The fragment measures up to 13 mm largest dimension. L4-L5: Severe disc space loss and circumferential disc osteophyte complex. Chronic moderate facet hypertrophy. Still, right lateral recess patency appears improved since 2011. Chronic moderate right greater than left foraminal stenosis appears stable and mostly related to endplate spurring. L5-S1: Left eccentric chronic circumferential disc osteophyte complex appears stable since 2011 along with moderate facet hypertrophy. Moderate left greater than right L5 foraminal stenosis appears stable. IMPRESSION: 1. Prematurely discontinued and motion degraded study despite maximal patient medication and multiple imaging attempts. 2. Severe spinal stenosis at L3-L4 appears progressed since 2011 and in part related to a sequestered disc fragment best seen on series 3, image 7. Grade 1 anterolisthesis also has progressed at this level along with severe facet hypertrophy. 3. Confluent marrow edema in the L3, L4, and L5 vertebral bodies is favored to be degenerative in nature owing to the progressed and now severe disc degeneration at these levels. Infection is felt less likely but in light of the patient's diabetes, blood cultures and correlation with serologic markers of infection are recommended. Electronically Signed   By: Genevie Ann M.D.   On: 05/07/2015 19:52    Review of Systems - Negative except as above    Blood pressure 139/77, pulse 56, temperature 97.8 F (36.6 C), temperature source  Oral, resp. rate 20, height 6' (1.829 m), weight 127.4 kg (280 lb 13.9 oz), SpO2 96 %. Physical Exam  Constitutional: He is oriented to person, place, and time. He appears well-developed and well-nourished.  HENT:  Head: Normocephalic and atraumatic.  Eyes: Conjunctivae and EOM are normal. Pupils are equal, round, and reactive to light.  Neck: Normal range of motion. Neck supple.  Musculoskeletal: Normal range of motion.  Neurological: He is alert and oriented to person, place, and time. A sensory deficit is present. No cranial nerve deficit. GCS eye subscore is 4. GCS verbal subscore is 5. GCS motor subscore is 6. He displays no Babinski's sign on the right side. He displays no Babinski's sign on the left side.  Reflex Scores:      Patellar reflexes are 1+ on the right side and 1+ on the left side.      Achilles reflexes are 1+ on the right side and 1+ on the left side.  Patient has bilateral quadriceps, dorsiflexion, plantar flexion weakness.  DF weakness greater on right than left.  He complains of numbness in both legs.  Positive SLR left > right  Skin: Skin is warm and dry.  Psychiatric: He has a normal mood and affect. His behavior is normal. Judgment and thought content normal.    Assessment/Plan: Patient has severe spinal degenerative arthritis with baseline stenosis and spondylolisthesis at L 34 and L 45 levels.  He has had a significant recent decline and new MRI shows a large disc herniation at the L 34 level with cephalad migration of a free fragment.  The MRI was interpreted as likely degenerative, but possibly discitis.  There are no labs which support the diagnosis of discitis, nor does his history.  He is afebrile.  I spoke with Dr. Evalee Mutton and agreed that a course of steroids would be helpful.  Patient should be mobilized with PT.  [name] will see Monday and patient will likely require surgery.  I will follow patient this weekend.  Peggyann Shoals, MD 05/08/2015, 9:26 AM

## 2015-05-08 NOTE — H&P (Signed)
[facility] History and Physical  Patient: Kenneth Lee  MRN: [id number]  DOB: [birth year]  DOS: the patient was seen and examined on 05/07/2015 PCP: Kandice Hams, MD  Referring physician: Dr. Jeneen Rinks Chief Complaint: Back pain  HPI: Kenneth Lee is a 69 y.o. male with Past medical history of spinal stenosis, hypertension, dyslipidemia, erectile dysfunction, coronary artery disease, obstructive sleep apnea on C Pap. The patient is presenting with complaints of progressively worsening back pain for last 10 days. The patient was seen by his neurosurgeon as an outpatient and was planned to get an MRI today. Because of his severe back pain the patient was unable to get the MRI and then was referred to ER. After receiving multiple medication the patient did complete the MRI. Patient continues to endorse severe back pain and has not been able to ambulate at all for last 3-4 days. Patient denies any generalized weakness patient denies any numbness in his legs. Patient denies any loss of control of bowel or bladder. Patient mentions he has not had any bowel movement for last 5 days. Patient complains of swelling in his legs ongoing for last 6 days. Denies having any chest pain or shortness of breath no fever no chills no nausea no vomiting. He denies any recent change in his medications. He also denies any fall trauma or injury.  The patient is coming from home.  At his baseline ambulates without support And is independent for most of his ADL; manages his medication on his own.  Review of Systems: as mentioned in the history of present illness.  A comprehensive review of the other systems is negative.  Past Medical History  Diagnosis Date  . Erectile dysfunction   . Hypertension   . Sleep apnea   . Hyperlipidemia     mixed hyperlidemia  . Goiter   . Other specified cardiac dysrhythmias(427.89)   . Sinoatrial node dysfunction (HCC)   . Carotid disease, bilateral ([place])     . Coronary artery disease     with bare mental stent 2008 and RCA is Card   Dr.Smith  . History of aortoiliofemoral vascular bypass     Dr [name]  . Diabetes mellitus without complication [facility])    Past Surgical History  Procedure Laterality Date  . Cholecystectomy    . Aortic bifemural bypass    . Discectomy     Social History:  reports that he quit smoking about 11 years ago. He does not have any smokeless tobacco history on file. He reports that he drinks alcohol. He reports that he does not use illicit drugs.  Allergies. Penicillin.  Family History  Problem Relation Age of Onset  . Heart disease Mother   . Cancer Mother   . Hypertension Other   . Hyperlipidemia Other   . Heart attack Mother   . Stroke Neg Hx   . Hypertension Mother   . Hypertension Father     Prior to Admission medications   Medication Sig Start Date End Date Taking? Authorizing Provider  amLODipine (NORVASC) 5 MG tablet Take 1 tablet (5 mg total) by mouth daily. 06/10/14  Yes Belva Crome, MD  aspirin EC 325 MG tablet Take 325 mg by mouth daily.    Yes Historical Provider, MD  BYSTOLIC 10 MG tablet TAKE 1 TABLET BY MOUTH EVERY DAY 07/28/14  Yes Belva Crome, MD  CRESTOR 10 MG tablet TAKE 1 TABLET BY MOUTH EVERY DAY 12/22/14  Yes Belva Crome, MD  diazepam (  VALIUM) 10 MG tablet Take 10 mg by mouth every 6 (six) hours as needed for anxiety.   Yes Historical Provider, MD  furosemide (LASIX) 40 MG tablet Take 1 tablet (40 mg total) by mouth daily. 06/09/14  Yes Belva Crome, MD  HYDROcodone-acetaminophen [facility]) 10-325 MG tablet Take 1 tablet by mouth every 6 (six) hours as needed for moderate pain.   Yes Historical Provider, MD  lisinopril (PRINIVIL,ZESTRIL) 20 MG tablet Take 1 tablet (20 mg total) by mouth daily. 07/09/14  Yes Belva Crome, MD  metFORMIN (GLUCOPHAGE) 500 MG tablet Take 500 mg by mouth daily.  07/08/14  Yes Historical Provider, MD  nitroGLYCERIN (NITROSTAT) 0.4 MG SL tablet Place 0.4 mg  under the tongue every 5 (five) minutes as needed for chest pain.   Yes Historical Provider, MD  amLODipine (NORVASC) 5 MG tablet Take 1 tablet (5 mg total) by mouth daily. Patient not taking: Reported on 05/07/2015 04/28/15   Belva Crome, MD  bisacodyl (DULCOLAX) 5 MG EC tablet Take 2 tablets (10 mg total) by mouth daily as needed for moderate constipation. 05/07/15   Olivia Canter Sam, PA-C  furosemide (LASIX) 40 MG tablet TAKE 1 TABLET BY MOUTH ONCE DAILY Patient not taking: Reported on 05/07/2015 07/28/14   Belva Crome, MD    Physical Exam: Filed Vitals:   05/07/15 1947 05/07/15 2236 05/08/15 0000 05/08/15 0139  BP: 123/68 138/70 150/58   Pulse: 66 47 59 51  Temp:   97.7 F (36.5 C)   TempSrc:   Oral   Resp: '20 18 18 18  ' Height:   6' (1.829 m)   Weight:   127.4 kg (280 lb 13.9 oz)   SpO2: 93% 94% 93% 95%    General: Alert, Awake and Oriented to Time, Place and Person. Appear in mild distress Eyes: PERRL ENT: Oral Mucosa clear moist. Neck: no JVD Cardiovascular: S1 and S2 Present, no Murmur, Peripheral Pulses Present Respiratory: Bilateral Air entry equal and Decreased,  Clear to Auscultation, no Crackles, no wheezes Abdomen: Bowel Sound present, Soft and no tenderness Skin: no Rash Extremities: Bilateral Pedal edema, no calf tenderness Neurologic: Grossly no focal neuro deficit.  Labs on Admission:  CBC:  Recent Labs Lab 05/07/15 2156  WBC 11.2*  NEUTROABS 7.8*  HGB 15.3  HCT 44.0  MCV 92.2  PLT 170    CMP     Component Value Date/Time   NA 134* 05/08/2015 0017   K 4.3 05/08/2015 0017   CL 98* 05/08/2015 0017   CO2 22 05/08/2015 0017   GLUCOSE 135* 05/08/2015 0017   BUN 15 05/08/2015 0017   CREATININE 0.72 05/08/2015 0017   CALCIUM 9.8 05/08/2015 0017   GFRNONAA >60 05/08/2015 0017   GFRAA >60 05/08/2015 0017    No results for input(s): CKTOTAL, CKMB, CKMBINDEX, TROPONINI in the last 168 hours. BNP (last 3 results)  Recent Labs  05/08/15 0017    BNP 12.3    ProBNP (last 3 results) No results for input(s): PROBNP in the last 8760 hours.   Radiological Exams on Admission: Mr Lumbar Spine Wo Contrast  05/07/2015  CLINICAL DATA:  69 year old male with lumbar back pain, chronic but progressive x3 weeks. Severe pain, unable to have a outpatient lumbar MRI. Subsequent encounter. EXAM: MRI LUMBAR SPINE WITHOUT CONTRAST TECHNIQUE: Multiplanar, multisequence MR imaging of the lumbar spine was performed. No intravenous contrast was administered. COMPARISON:  [facility] lumbar radiographs 04/29/2015. [facility] 02/26/2012. [facility] lumbar  MRI 07/14/2009. FINDINGS: The examination had to be discontinued prior to completion due to pain. Levoconvex lumbar scoliosis demonstrated on the recent radiographs. Normal lumbar segmentation, and this corresponds to the numbering system used on the 2011 comparison. Since 2011 anterolisthesis at L3-L4 and retrolisthesis at L4-L5 have progressed. Severe disc space loss at both levels has progressed. Trace vacuum disc anteriorly at L3-L4 on the recent radiographs. Progressed endplate spurring and sclerosis, maximal at L4-L5. On the sagittal STIR imaging today there is confluent STIR hyperintensity in the L3, L4, and L5 vertebral bodies mostly along the endplates. No suspicious increased T2 or STIR signal identified within the intervening disc spaces. No definite prevertebral/paraspinal STIR hyperintensity. Visualized lower thoracic spinal cord is normal with conus medularis at L1-L2. 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 T12-L1:  Negative. L1-L2: Circumferential disc bulge. Borderline to mild spinal stenosis. L2-L3: Circumferential disc osteophyte complex and mild to moderate facet and ligament flavum hypertrophy. Mild to moderate spinal stenosis, not significantly changed since 2011. L3-L4: Bulky circumferential disc osteophyte complex and progressed severe facet  hypertrophy. Severe spinal stenosis here appears increased since 2011. The sagittal images suggest a disc extrusion, sequestered fragment contributing to the appearance. See series 3, image 7 and series 8, image 20. The fragment measures up to 13 mm largest dimension. L4-L5: Severe disc space loss and circumferential disc osteophyte complex. Chronic moderate facet hypertrophy. Still, right lateral recess patency appears improved since 2011. Chronic moderate right greater than left foraminal stenosis appears stable and mostly related to endplate spurring. L5-S1: Left eccentric chronic circumferential disc osteophyte complex appears stable since 2011 along with moderate facet hypertrophy. Moderate left greater than right L5 foraminal stenosis appears stable. IMPRESSION: 1. Prematurely discontinued and motion degraded study despite maximal patient medication and multiple imaging attempts. 2. Severe spinal stenosis at L3-L4 appears progressed since 2011 and in part related to a sequestered disc fragment best seen on series 3, image 7. Grade 1 anterolisthesis also has progressed at this level along with severe facet hypertrophy. 3. Confluent marrow edema in the L3, L4, and L5 vertebral bodies is favored to be degenerative in nature owing to the progressed and now severe disc degeneration at these levels. Infection is felt less likely but in light of the patient's diabetes, blood cultures and correlation with serologic markers of infection are recommended. Electronically Signed   By: Genevie Ann M.D.   On: 05/07/2015 19:52   Assessment/Plan 1. Back pain Patient presents with complaints of previous back pain. The patient MRI is showing inflammatory changes which could also represent a possible infection. Patient has blood cultures and ESR and CRP drawn. We will admit the patient for pain control overnight. Patient will be discussed with neurosurgery in the morning for further workup including a possible biopsy  depending on the discussion with Dr. Colin Mulders.  2. Constipation. Patient will be placed on bowel regimen I would give him milk of magnesia 1 as well.   3. Edema. We will check lower extremity Doppler to rule out DVT.  4  CAD (coronary artery disease), native coronary artery Continue home medications.  5  Essential hypertension Continuing home medications.  6  Type 2 diabetes mellitus not at goal [facility]) Placing the patient on sliding scale insulin.  7  OSA on CPAP Continuing C Pap.  Nutrition: Cardiac and diabetic diet nothing by mouth after midnight DVT Prophylaxis: mechanical compression device  Advance goals of care discussion: Full code  Family Communication: family was present at bedside, opportunity was  given to ask question and all questions were answered satisfactorily at the time of interview. Disposition: Admitted as observation, telemetry unit.  Author: Berle Mull, MD [facility] Pager: [phone] 05/07/2015  If 7PM-7AM, please contact night-coverage [email or web address] Password [email or web address]

## 2015-05-08 NOTE — Progress Notes (Addendum)
PATIENT DETAILS Name: Kenneth Lee Age: 69 y.o. Sex: male Date of Birth: [birth year] Admit Date: 05/07/2015 Admitting Physician Lavina Hamman, MD WUJ:[id number],NWGNFA D, MD  Subjective: Back pain somewhat better. Has had worsening lower extremity edema for the past 5 days. Unable to lie flat because of severe pain-but denies orthopnea.  Assessment/Plan: Principal Problem: Intractable back pain: Secondary to severe spinal stenosis and a large disc herniation at L3-L4 level. Although some concern for possible discitis on MRI-clinically no suggestion of discitis. CRP and ESR only minimally elevated, afebrile. Neurosurgery consulted-likely will require surgery early next week. In the meantime, will start steroids, supportive care with as needed narcotics, mobilize with PT and obtain preoperative cardiology evaluation  Active Problems: Worsening lower extremity edema:? Etiology-suspect mild CHF decompensation (no prior echo-for EF eval). Will check LFTs to assess albumin, UA to check for proteinuria and an echocardiogram. Stop oral Lasix and start IV Lasix. Check BNP as well. Follow  History of CAD-status post bare metal stent 2008: Continue aspirin, statin and beta blocker. Without chest pain or shortness of breath-but very sedentary lifestyle for the past few months/years given slowly progressively worsening back pain.  Type 2 diabetes: Continue to hold metformin-since being started on steroids-change this aside to moderate. Check A1c. May need to be started on low-dose Lantus if CBGs are significantly elevated with steroids  Hypertension: Controlled-continue amlodipine, Bystolic, lisinopril. Follow  Dyslipidemia: Continue with Crestor  Obesity: Counseled regarding importance of weight loss  OSA on CPAP  History of peripheral vascular disease status post aortofemoral bypass.  Disposition: Remain inpatient-suspect will require several more days of hospitalization and  back surgery prior to discharge  Antimicrobial agents  See below  Anti-infectives    None      DVT Prophylaxis: Prophylactic Lovenox   Code Status: Full code   Family Communication None at bedside  Procedures: None  CONSULTS:  cardiology and Neurosurgery  Time spent 40 minutes-Greater than 50% of this time was spent in counseling, explanation of diagnosis, planning of further management, and coordination of care.  MEDICATIONS: Scheduled Meds: . amLODipine  5 mg Oral Daily  . aspirin EC  325 mg Oral Daily  . dexamethasone  4 mg Intravenous Q8H  . docusate sodium  100 mg Oral BID  . furosemide  40 mg Intravenous Daily  . insulin aspart  0-9 Units Subcutaneous Q6H  . lisinopril  20 mg Oral Daily  . magnesium hydroxide  15 mL Oral Once  . nebivolol  10 mg Oral Daily  . polyethylene glycol  17 g Oral Daily  . potassium chloride  20 mEq Oral Daily  . rosuvastatin  10 mg Oral Daily  . sodium chloride  3 mL Intravenous Q12H   Continuous Infusions:  PRN Meds:.acetaminophen **OR** acetaminophen, cyclobenzaprine, diazepam, morphine injection, ondansetron **OR** ondansetron (ZOFRAN) IV, oxyCODONE    PHYSICAL EXAM: Vital signs in last 24 hours: Filed Vitals:   05/08/15 0000 05/08/15 0139 05/08/15 0558 05/08/15 1030  BP: 150/58  139/77 162/76  Pulse: 59 51 56 57  Temp: 97.7 F (36.5 C)  97.8 F (36.6 C) 97.5 F (36.4 C)  TempSrc: Oral  Oral Oral  Resp: '18 18 20 20  ' Height: 6' (1.829 m)     Weight: 127.4 kg (280 lb 13.9 oz)     SpO2: 93% 95% 96% 98%    Weight change:  Filed Weights   05/08/15 0000  Weight:  127.4 kg (280 lb 13.9 oz)   Body mass index is 38.08 kg/(m^2).   Gen Exam: Awake and alert with clear speech.   Neck: Supple, No JVD.   Chest: B/L Clear.   CVS: S1 S2 Regular, no murmurs.  Abdomen: soft, BS +, non tender, non distended.  Extremities: no edema, lower extremities warm to touch Neurologic: Lower extremities-4/5. Positive straight leg  raising test left more than right. Skin: No Rash.   Wounds: N/A.   Intake/Output from previous day:  Intake/Output Summary (Last 24 hours) at 05/08/15 1053 Last data filed at 05/08/15 0559  Gross per 24 hour  Intake      0 ml  Output    100 ml  Net   -100 ml     LAB RESULTS: CBC  Recent Labs Lab 05/07/15 2156 05/08/15 0630  WBC 11.2* 11.1*  HGB 15.3 14.5  HCT 44.0 44.2  PLT 170 164  MCV 92.2 94.4  MCH 32.1 31.0  MCHC 34.8 32.8  RDW 12.8 13.2  LYMPHSABS 2.4 2.6  MONOABS 1.0 0.7  EOSABS 0.1 0.1  BASOSABS 0.0 0.0    Chemistries   Recent Labs Lab 05/07/15 2156 05/08/15 0017 05/08/15 0630  NA 128* 134* 131*  K 4.2 4.3 4.5  CL 95* 98* 95*  CO2 '24 22 27  ' GLUCOSE 120* 135* 129*  BUN '15 15 15  ' CREATININE 0.61 0.72 0.86  CALCIUM 9.4 9.8 9.6    CBG:  Recent Labs Lab 05/08/15 0018 05/08/15 0613  GLUCAP 125* 129*    GFR Estimated Creatinine Clearance: 110.2 mL/min (by C-G formula based on Cr of 0.86).  Coagulation profile  Recent Labs Lab 05/08/15 0017  INR 1.17    Cardiac Enzymes No results for input(s): CKMB, TROPONINI, MYOGLOBIN in the last 168 hours.  Invalid input(s): CK  Invalid input(s): POCBNP No results for input(s): DDIMER in the last 72 hours. No results for input(s): HGBA1C in the last 72 hours. No results for input(s): CHOL, HDL, LDLCALC, TRIG, CHOLHDL, LDLDIRECT in the last 72 hours.  Recent Labs  05/08/15 0017  TSH 2.066   No results for input(s): VITAMINB12, FOLATE, FERRITIN, TIBC, IRON, RETICCTPCT in the last 72 hours. No results for input(s): LIPASE, AMYLASE in the last 72 hours.  Urine Studies No results for input(s): UHGB, CRYS in the last 72 hours.  Invalid input(s): UACOL, UAPR, USPG, UPH, UTP, UGL, UKET, UBIL, UNIT, UROB, ULEU, UEPI, UWBC, URBC, UBAC, CAST, UCOM, BILUA  MICROBIOLOGY: No results found for this or any previous visit (from the past 240 hour(s)).  RADIOLOGY STUDIES/RESULTS: Mr Lumbar Spine Wo  Contrast  05/07/2015  CLINICAL DATA:  69 year old male with lumbar back pain, chronic but progressive x3 weeks. Severe pain, unable to have a outpatient lumbar MRI. Subsequent encounter. EXAM: MRI LUMBAR SPINE WITHOUT CONTRAST TECHNIQUE: Multiplanar, multisequence MR imaging of the lumbar spine was performed. No intravenous contrast was administered. COMPARISON:  [facility] lumbar radiographs 04/29/2015. [facility] 02/26/2012. [facility] lumbar MRI 07/14/2009. FINDINGS: The examination had to be discontinued prior to completion due to pain. Levoconvex lumbar scoliosis demonstrated on the recent radiographs. Normal lumbar segmentation, and this corresponds to the numbering system used on the 2011 comparison. Since 2011 anterolisthesis at L3-L4 and retrolisthesis at L4-L5 have progressed. Severe disc space loss at both levels has progressed. Trace vacuum disc anteriorly at L3-L4 on the recent radiographs. Progressed endplate spurring and sclerosis, maximal at L4-L5. On the sagittal STIR imaging today there is confluent  STIR hyperintensity in the L3, L4, and L5 vertebral bodies mostly along the endplates. No suspicious increased T2 or STIR signal identified within the intervening disc spaces. No definite prevertebral/paraspinal STIR hyperintensity. Visualized lower thoracic spinal cord is normal with conus medularis at L1-L2. 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 T12-L1:  Negative. L1-L2: Circumferential disc bulge. Borderline to mild spinal stenosis. L2-L3: Circumferential disc osteophyte complex and mild to moderate facet and ligament flavum hypertrophy. Mild to moderate spinal stenosis, not significantly changed since 2011. L3-L4: Bulky circumferential disc osteophyte complex and progressed severe facet hypertrophy. Severe spinal stenosis here appears increased since 2011. The sagittal images suggest a disc extrusion, sequestered fragment contributing to the  appearance. See series 3, image 7 and series 8, image 20. The fragment measures up to 13 mm largest dimension. L4-L5: Severe disc space loss and circumferential disc osteophyte complex. Chronic moderate facet hypertrophy. Still, right lateral recess patency appears improved since 2011. Chronic moderate right greater than left foraminal stenosis appears stable and mostly related to endplate spurring. L5-S1: Left eccentric chronic circumferential disc osteophyte complex appears stable since 2011 along with moderate facet hypertrophy. Moderate left greater than right L5 foraminal stenosis appears stable. IMPRESSION: 1. Prematurely discontinued and motion degraded study despite maximal patient medication and multiple imaging attempts. 2. Severe spinal stenosis at L3-L4 appears progressed since 2011 and in part related to a sequestered disc fragment best seen on series 3, image 7. Grade 1 anterolisthesis also has progressed at this level along with severe facet hypertrophy. 3. Confluent marrow edema in the L3, L4, and L5 vertebral bodies is favored to be degenerative in nature owing to the progressed and now severe disc degeneration at these levels. Infection is felt less likely but in light of the patient's diabetes, blood cultures and correlation with serologic markers of infection are recommended. Electronically Signed   By: Genevie Ann M.D.   On: 05/07/2015 19:52    Oren Binet, MD  [facility] Pager:[phone]  If 7PM-7AM, please contact night-coverage [email or web address] Password [email or web address] 05/08/2015, 10:53 AM

## 2015-05-08 NOTE — Progress Notes (Signed)
PT Cancellation Note  Patient Details Name: AXZEL ROCKHILL MRN: [id number] DOB: 12-Nov-1945   Cancelled Treatment:    Reason Eval/Treat Not Completed: Patient not medically ready   Noted MRI complete, no neurosurgery note yet completed, noted pain meds due and to start steroids at 10:00. Will defer eval at this time and attempt later today.   Eola Waldrep 05/08/2015, 10:23 AM  Pager [phone]

## 2015-05-08 NOTE — Progress Notes (Signed)
RT note: Placed patient on CPAP 10cmH2O with nasal mask per RCP protocol. Patient tolerating well vital signs stable.

## 2015-05-08 NOTE — Clinical Documentation Improvement (Addendum)
Hospitalist  (please document your query response in the progress notes and discharge summary, not on the query form itself.)   A cause and effect relationship cannot be assumed and requires documentation by the attending physician.  To help with code assignment and provide greater specificity, please document the cause and/or associated condition(s) related to the patient's OSA.  Possible Clinical Conditions:  - OSA secondary to Obesity  - OSA secondary to other condition  - Unable to clinically determine  Clinical Information: "OSA" is documented by the ED provider note by Dr. Jeneen Rinks "OSA on CPAP" is documented in the H&P and subsequent progress notes. "Obesity: Counseled regarding importance of weight loss" is documented in the progress note dated 05/08/15.    Please exercise your independent, professional judgment when responding. A specific answer is not anticipated or expected.  Thank You,  Erling Conte  RN BSN CCDS [phone] Health Information Management [place]

## 2015-05-08 NOTE — Progress Notes (Signed)
*  PRELIMINARY RESULTS* Vascular Ultrasound Lower extremity venous duplex has been completed.  Preliminary findings: No evidence of DVT or baker's cyst.   Landry Mellow, RDMS, RVT  05/08/2015, 2:03 PM

## 2015-05-09 DIAGNOSIS — M549 Dorsalgia, unspecified: Secondary | ICD-10-CM

## 2015-05-09 DIAGNOSIS — I2510 Atherosclerotic heart disease of native coronary artery without angina pectoris: Secondary | ICD-10-CM

## 2015-05-09 LAB — COMPREHENSIVE METABOLIC PANEL
ALT: 25 U/L (ref 17–63)
AST: 29 U/L (ref 15–41)
Albumin: 3.5 g/dL (ref 3.5–5.0)
Alkaline Phosphatase: 71 U/L (ref 38–126)
Anion gap: 8 (ref 5–15)
BUN: 17 mg/dL (ref 6–20)
CO2: 25 mmol/L (ref 22–32)
Calcium: 9.2 mg/dL (ref 8.9–10.3)
Chloride: 96 mmol/L — ABNORMAL LOW (ref 101–111)
Creatinine, Ser: 0.75 mg/dL (ref 0.61–1.24)
GFR calc Af Amer: >60 mL/min (ref >60)
GFR calc non Af Amer: >60 mL/min (ref >60)
Glucose, Bld: 167 mg/dL — ABNORMAL HIGH (ref 65–99)
Potassium: 4.8 mmol/L (ref 3.5–5.1)
Sodium: 129 mmol/L — ABNORMAL LOW (ref 135–145)
Total Bilirubin: 0.7 mg/dL (ref 0.3–1.2)
Total Protein: 6.5 g/dL (ref 6.5–8.1)

## 2015-05-09 LAB — BRAIN NATRIURETIC PEPTIDE: B Natriuretic Peptide: 24.9 pg/mL (ref 0.0–100.0)

## 2015-05-09 LAB — GLUCOSE, CAPILLARY
Glucose-Capillary: 143 mg/dL — ABNORMAL HIGH (ref 65–99)
Glucose-Capillary: 184 mg/dL — ABNORMAL HIGH (ref 65–99)
Glucose-Capillary: 202 mg/dL — ABNORMAL HIGH (ref 65–99)
Glucose-Capillary: 241 mg/dL — ABNORMAL HIGH (ref 65–99)
Glucose-Capillary: 267 mg/dL — ABNORMAL HIGH (ref 65–99)

## 2015-05-09 MED ORDER — PANTOPRAZOLE SODIUM 40 MG PO TBEC
40.0000 mg | DELAYED_RELEASE_TABLET | Freq: Every day | ORAL | Status: DC
  Administered 2015-05-09 – 2015-05-13 (×5): 40 mg via ORAL
  Filled 2015-05-09 (×5): qty 1

## 2015-05-09 MED ORDER — FLEET ENEMA 7-19 GM/118ML RE ENEM: 1.0000 | ENEMA | Freq: Every day | RECTAL | Status: DC | PRN

## 2015-05-09 NOTE — Progress Notes (Addendum)
PATIENT DETAILS Name: Kenneth Lee Age: 69 y.o. Sex: male Date of Birth: June 20, 1946 Admit Date: 05/07/2015 Admitting Physician Lavina Hamman, MD BHA:[id number] D, MD  Brief Narrative:  69 y.o. male with PMHx of CAD s/p PCI with BMS in 2008, chronic back pain from spinal stenosis admitted with worsening back pain. MRI LS Spine shows severe spinal stenosis with last disc herniation at L2-L3. Seen by neurosurgery, plans are for surgery early next week. Currently pre-operative eval in progress-awaiting Echo-Cards consulted. Back pain improved with supportive measures.  Subjective: Back pain improved-appears much more comfortable today  Assessment/Plan: Principal Problem: Intractable back pain: Secondary to severe spinal stenosis and a large disc herniation at L3-L4 level. Although some concern for possible discitis on MRI-clinically no suggestion of discitis. CRP and ESR only minimally elevated, afebrile. Neurosurgery consulted-likely will require surgery early next week. In the meantime, continue steroids, supportive care with as needed narcotics, mobilize with PT. Awaiting echo and final preoperative cardiology evaluation  Active Problems: Worsening lower extremity edema:? Etiology-from recent steroids versus mild CHF decompensation (no prior echo-for EF eval). Continue IV Lasix and follow electrolytes. Lower extremity edema seems to be somewhat improved today. Dopplers negative for DVT  Hyponatremia:mild-continue Lasix-recheck lytes in am. Monitor-will initiate further work up if decreases further  History of CAD-status post bare metal stent 2008: Continue aspirin, statin and beta blocker. Without chest pain or shortness of breath-but very sedentary lifestyle for the past few months/years given slowly progressively worsening back pain.  Type 2 diabetes: Continue to hold metformin-CBGs stable with SSI. Monitor closely as on steroids.  Hypertension:  Controlled-continue amlodipine (could be contributing to edema-for now continue), Bystolic, lisinopril. Follow  Dyslipidemia: Continue with Crestor  Obesity: Counseled regarding importance of weight loss  OSA on CPAP: OSA likely secondary to obesity  History of peripheral vascular disease status post aortofemoral bypass.  Disposition: Remain inpatient-suspect will require several more days of hospitalization and back surgery prior to discharge  Antimicrobial agents  See below  Anti-infectives    None      DVT Prophylaxis: Prophylactic Lovenox   Code Status: Full code   Family Communication None at bedside  Procedures: None  CONSULTS:  cardiology and Neurosurgery  Time spent 30 minutes-Greater than 50% of this time was spent in counseling, explanation of diagnosis, planning of further management, and coordination of care.  MEDICATIONS: Scheduled Meds: . acetaminophen  1,000 mg Oral Q8H  . amLODipine  5 mg Oral Daily  . aspirin EC  325 mg Oral Daily  . dexamethasone  4 mg Intravenous Q8H  . docusate sodium  100 mg Oral BID  . enoxaparin (LOVENOX) injection  40 mg Subcutaneous Q24H  . furosemide  40 mg Intravenous Daily  . insulin aspart  0-15 Units Subcutaneous TID WC  . lisinopril  20 mg Oral Daily  . magnesium hydroxide  15 mL Oral Once  . nebivolol  10 mg Oral Daily  . pantoprazole  40 mg Oral Q1200  . polyethylene glycol  17 g Oral BID  . potassium chloride  20 mEq Oral Daily  . rosuvastatin  10 mg Oral Daily  . sodium chloride  3 mL Intravenous Q12H   Continuous Infusions:  PRN Meds:.cyclobenzaprine, diazepam, morphine injection, ondansetron **OR** ondansetron (ZOFRAN) IV, oxyCODONE, sodium phosphate    PHYSICAL EXAM: Vital signs in last 24 hours: Filed Vitals:   05/08/15 2207 05/09/15 0107 05/09/15 5329 05/09/15 0940  BP: 127/63 160/64 154/69 140/64  Pulse: 56 57 47 59  Temp: 98.3 F (36.8 C) 97.7 F (36.5 C) 97.5 F (36.4 C) 98.1 F (36.7  C)  TempSrc: Oral Oral Oral Oral  Resp: _0 Height:      Weight:      SpO2: 92% 93% 97% 96%    Weight change:  Filed Weights   05/08/15 0000  Weight: 127.4 kg (280 lb 13.9 oz)   Body mass index is 38.08 kg/(m^2).   Gen Exam: Awake and alert with clear speech.   Neck: Supple, No JVD.   Chest: B/L Clear.   CVS: S1 S2 Regular, no murmurs.  Abdomen: soft, BS +, non tender, non distended.  Extremities: ++ edema, lower extremities warm to touch Neurologic: Lower extremities-4/5. Positive straight leg raising test left more than right. Skin: No Rash.   Wounds: N/A.   Intake/Output from previous day:  Intake/Output Summary (Last 24 hours) at 05/09/15 1119 Last data filed at 05/09/15 0900  Gross per 24 hour  Intake    360 ml  Output      0 ml  Net    360 ml     LAB RESULTS: CBC  Recent Labs Lab 05/07/15 2156 05/08/15 0630  WBC 11.2* 11.1*  HGB 15.3 14.5  HCT 44.0 44.2  PLT 170 164  MCV 92.2 94.4  MCH 32.1 31.0  MCHC 34.8 32.8  RDW 12.8 13.2  LYMPHSABS 2.4 2.6  MONOABS 1.0 0.7  EOSABS 0.1 0.1  BASOSABS 0.0 0.0    Chemistries   Recent Labs Lab 05/07/15 2156 05/08/15 0017 05/08/15 0630 05/09/15 0525  NA 128* 134* 131* 129*  K 4.2 4.3 4.5 4.8  CL 95* 98* 95* 96*  CO2 _1 GLUCOSE 120* 135* 129* 167*  BUN _2 CREATININE 0.61 0.72 0.86 0.75  CALCIUM 9.4 9.8 9.6 9.2    CBG:  Recent Labs Lab 05/08/15 0613 05/08/15 1107 05/08/15 1703 05/08/15 2353 05/09/15 0610  GLUCAP 129* 113* 152* 241* 143*    GFR Estimated Creatinine Clearance: 118.5 mL/min (by C-G formula based on Cr of 0.75).  Coagulation profile  Recent Labs Lab 05/08/15 0017  INR 1.17    Cardiac Enzymes No results for input(s): CKMB, TROPONINI, MYOGLOBIN in the last 168 hours.  Invalid input(s): CK  Invalid input(s): POCBNP No results for input(s): DDIMER in the last 72 hours. No results for input(s): HGBA1C in the last 72 hours. No results for  input(s): CHOL, HDL, LDLCALC, TRIG, CHOLHDL, LDLDIRECT in the last 72 hours.  Recent Labs  05/08/15 0017  TSH 2.066   No results for input(s): VITAMINB12, FOLATE, FERRITIN, TIBC, IRON, RETICCTPCT in the last 72 hours. No results for input(s): LIPASE, AMYLASE in the last 72 hours.  Urine Studies No results for input(s): UHGB, CRYS in the last 72 hours.  Invalid input(s): UACOL, UAPR, USPG, UPH, UTP, UGL, UKET, UBIL, UNIT, UROB, ULEU, UEPI, UWBC, URBC, UBAC, CAST, UCOM, BILUA  MICROBIOLOGY: Recent Results (from the past 240 hour(s))  Culture, blood (routine x 2)     Status: None (Preliminary result)   Collection Time: 05/07/15  9:50 PM  Result Value Ref Range Status   Specimen Description BLOOD RIGHT WRIST  Final   Special Requests BOTTLES DRAWN AEROBIC AND ANAEROBIC 5CC  Final   Culture NO GROWTH < 24 HOURS  Final   Report Status PENDING  Incomplete  Culture, blood (routine x 2)  Status: None (Preliminary result)   Collection Time: 05/07/15  9:56 PM  Result Value Ref Range Status   Specimen Description BLOOD RIGHT HAND  Final   Special Requests BOTTLES DRAWN AEROBIC AND ANAEROBIC 5CC  Final   Culture NO GROWTH < 24 HOURS  Final   Report Status PENDING  Incomplete    RADIOLOGY STUDIES/RESULTS: Mr Lumbar Spine Wo Contrast  05/07/2015  CLINICAL DATA:  69 year old male with lumbar back pain, chronic but progressive x3 weeks. Severe pain, unable to have a outpatient lumbar MRI. Subsequent encounter. EXAM: MRI LUMBAR SPINE WITHOUT CONTRAST TECHNIQUE: Multiplanar, multisequence MR imaging of the lumbar spine was performed. No intravenous contrast was administered. COMPARISON:  [facility] lumbar radiographs 04/29/2015. [facility] lumbar MRI 02/26/2012. [facility] lumbar MRI 07/14/2009. FINDINGS: The examination had to be discontinued prior to completion due to pain. Levoconvex lumbar scoliosis demonstrated on the recent radiographs. Normal lumbar  segmentation, and this corresponds to the numbering system used on the 2011 comparison. Since 2011 anterolisthesis at L3-L4 and retrolisthesis at L4-L5 have progressed. Severe disc space loss at both levels has progressed. Trace vacuum disc anteriorly at L3-L4 on the recent radiographs. Progressed endplate spurring and sclerosis, maximal at L4-L5. On the sagittal STIR imaging today there is confluent STIR hyperintensity in the L3, L4, and L5 vertebral bodies mostly along the endplates. No suspicious increased T2 or STIR signal identified within the intervening disc spaces. No definite prevertebral/paraspinal STIR hyperintensity. Visualized lower thoracic spinal cord is normal with conus medularis at L1-L2. 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 T12-L1:  Negative. L1-L2: Circumferential disc bulge. Borderline to mild spinal stenosis. L2-L3: Circumferential disc osteophyte complex and mild to moderate facet and ligament flavum hypertrophy. Mild to moderate spinal stenosis, not significantly changed since 2011. L3-L4: Bulky circumferential disc osteophyte complex and progressed severe facet hypertrophy. Severe spinal stenosis here appears increased since 2011. The sagittal images suggest a disc extrusion, sequestered fragment contributing to the appearance. See series 3, image 7 and series 8, image 20. The fragment measures up to 13 mm largest dimension. L4-L5: Severe disc space loss and circumferential disc osteophyte complex. Chronic moderate facet hypertrophy. Still, right lateral recess patency appears improved since 2011. Chronic moderate right greater than left foraminal stenosis appears stable and mostly related to endplate spurring. L5-S1: Left eccentric chronic circumferential disc osteophyte complex appears stable since 2011 along with moderate facet hypertrophy. Moderate left greater than right L5 foraminal stenosis appears stable. IMPRESSION: 1. Prematurely discontinued and motion degraded study  despite maximal patient medication and multiple imaging attempts. 2. Severe spinal stenosis at L3-L4 appears progressed since 2011 and in part related to a sequestered disc fragment best seen on series 3, image 7. Grade 1 anterolisthesis also has progressed at this level along with severe facet hypertrophy. 3. Confluent marrow edema in the L3, L4, and L5 vertebral bodies is favored to be degenerative in nature owing to the progressed and now severe disc degeneration at these levels. Infection is felt less likely but in light of the patient's diabetes, blood cultures and correlation with serologic markers of infection are recommended. Electronically Signed   By: Genevie Ann M.D.   On: 05/07/2015 19:52    Oren Binet, MD  [facility] Pager:[phone]  If 7PM-7AM, please contact night-coverage [email or web address] Password [email or web address] 05/09/2015, 11:19 AM   LOS: 1 day

## 2015-05-09 NOTE — Progress Notes (Signed)
Pt has brought home mask and stated he can place himself on cpap, RT informed pt to call for RT if he needs assistance during the night

## 2015-05-09 NOTE — Evaluation (Signed)
Physical Therapy Evaluation Patient Details Name: Kenneth Lee MRN: [id number] DOB: 1946-01-28 Today's Date: 05/09/2015   History of Present Illness  Pt is a 69 y.o. male admitted for severe back pain. MRI reveals herniated disc at L3-4.  Clinical Impression  Pt is mod I to supervision for all functional mobility. Mobility is limited due to pain. No acute care PT intervention indicated at this time. Please re-consult, if pt does undergo surgery. PT signing off.    Follow Up Recommendations No PT follow up;Supervision - Intermittent    Equipment Recommendations  None recommended by PT    Recommendations for Other Services       Precautions / Restrictions Precautions Precautions: Back Precaution Comments: Pt educated on back precautions for comfort.      Mobility  Bed Mobility Overal bed mobility: Modified Independent                Transfers Overall transfer level: Modified independent Equipment used: Straight cane                Ambulation/Gait Ambulation/Gait assistance: Supervision Ambulation Distance (Feet): 50 Feet Assistive device: Straight cane Gait Pattern/deviations: Step-through pattern;Decreased stride length Gait velocity: decreased   General Gait Details: Pt declined ambulation in hallway.  Stairs            Wheelchair Mobility    Modified Rankin (Stroke Patients Only)       Balance                                             Pertinent Vitals/Pain Pain Assessment: 0-10 Pain Score: 10-Worst pain ever Pain Location: low back radiating down BLE with mobility Pain Descriptors / Indicators: Shooting Pain Intervention(s): Limited activity within patient's tolerance    Home Living Family/patient expects to be discharged to:: Private residence Living Arrangements: Spouse/significant other Available Help at Discharge: Family;Available 24 hours/day Type of Home: House Home Access: Stairs to enter Entrance  Stairs-Rails: Psychiatric nurse of Steps: 6 Home Layout: One level Home Equipment: Cane - single point      Prior Function Level of Independence: Independent               Hand Dominance        Extremity/Trunk Assessment   Upper Extremity Assessment: Overall WFL for tasks assessed           Lower Extremity Assessment: Overall WFL for tasks assessed      Cervical / Trunk Assessment: Normal  Communication   Communication: No difficulties  Cognition Arousal/Alertness: Awake/alert Behavior During Therapy: Agitated Overall Cognitive Status: Within Functional Limits for tasks assessed                      General Comments      Exercises        Assessment/Plan    PT Assessment Patent does not need any further PT services  PT Diagnosis Difficulty walking;Acute pain   PT Problem List    PT Treatment Interventions     PT Goals (Current goals can be found in the Care Plan section) Acute Rehab PT Goals Patient Stated Goal: decrease pain PT Goal Formulation: All assessment and education complete, DC therapy    Frequency     Barriers to discharge        Co-evaluation  End of Session   Activity Tolerance: Patient limited by pain Patient left: in bed;with call bell/phone within reach;with family/visitor present Nurse Communication: Mobility status         Time: 1257-1310 PT Time Calculation (min) (ACUTE ONLY): 13 min   Charges:   PT Evaluation $Initial PT Evaluation Tier I: 1 Procedure     PT G Codes:        Lorriane Shire 05/09/2015, 2:51 PM

## 2015-05-10 ENCOUNTER — Inpatient Hospital Stay (HOSPITAL_COMMUNITY): Payer: Medicare Other

## 2015-05-10 DIAGNOSIS — Z01810 Encounter for preprocedural cardiovascular examination: Secondary | ICD-10-CM

## 2015-05-10 LAB — BASIC METABOLIC PANEL
Anion gap: 7 (ref 5–15)
BUN: 18 mg/dL (ref 6–20)
CO2: 28 mmol/L (ref 22–32)
Calcium: 9.4 mg/dL (ref 8.9–10.3)
Chloride: 95 mmol/L — ABNORMAL LOW (ref 101–111)
Creatinine, Ser: 0.78 mg/dL (ref 0.61–1.24)
GFR calc Af Amer: >60 mL/min (ref >60)
GFR calc non Af Amer: >60 mL/min (ref >60)
Glucose, Bld: 143 mg/dL — ABNORMAL HIGH (ref 65–99)
Potassium: 4.8 mmol/L (ref 3.5–5.1)
Sodium: 130 mmol/L — ABNORMAL LOW (ref 135–145)

## 2015-05-10 LAB — URINALYSIS, ROUTINE W REFLEX MICROSCOPIC
Bilirubin Urine: NEGATIVE
Glucose, UA: 100 mg/dL — AB
Hgb urine dipstick: NEGATIVE
Ketones, ur: NEGATIVE mg/dL
Leukocytes, UA: NEGATIVE
Nitrite: NEGATIVE
Protein, ur: NEGATIVE mg/dL
Specific Gravity, Urine: 1.015 (ref 1.005–1.030)
Urobilinogen, UA: 1 mg/dL (ref 0.0–1.0)
pH: 5.5 (ref 5.0–8.0)

## 2015-05-10 LAB — GLUCOSE, CAPILLARY
Glucose-Capillary: 152 mg/dL — ABNORMAL HIGH (ref 65–99)
Glucose-Capillary: 150 mg/dL — ABNORMAL HIGH (ref 65–99)
Glucose-Capillary: 261 mg/dL — ABNORMAL HIGH (ref 65–99)
Glucose-Capillary: 272 mg/dL — ABNORMAL HIGH (ref 65–99)

## 2015-05-10 NOTE — Progress Notes (Signed)
Operator called back and said he reached the MD's answering service. Awaiting reply.

## 2015-05-10 NOTE — Progress Notes (Signed)
Subjective: Patient reports patient feeling a bit better.  Able to rest and lay down in bed.  Objective: Vital signs in last 24 hours: Temp:  [97.4 F (36.3 C)-98.4 F (36.9 C)] 97.6 F (36.4 C) (10/30 0900) Pulse Rate:  [40-58] 40 (10/30 0900) Resp:  [18-20] 18 (10/30 0900) BP: (128-164)/(46-70) 164/67 mmHg (10/30 0900) SpO2:  [95 %-99 %] 99 % (10/30 0900)  Intake/Output from previous day: 10/29 0701 - 10/30 0700 In: 1080 [P.O.:1080] Out: -  Intake/Output this shift:    Physical Exam: Better mobility of legs and less swelling.  Lab Results:  Recent Labs  05/07/15 2156 05/08/15 0630  WBC 11.2* 11.1*  HGB 15.3 14.5  HCT 44.0 44.2  PLT 170 164   BMET  Recent Labs  05/09/15 0525 05/10/15 0640  NA 129* 130*  K 4.8 4.8  CL 96* 95*  CO2 25 28  GLUCOSE 167* 143*  BUN 17 18  CREATININE 0.75 0.78  CALCIUM 9.2 9.4    Studies/Results: No results found.  Assessment/Plan: Patient is cleared for surgery.  Dr.  Ellene Route to meet with patient and formulate specific plan in AM.    LOS: 2 days    Riaz Onorato D, MD 05/10/2015, 11:46 AM

## 2015-05-10 NOTE — Progress Notes (Signed)
Called in by family member. On entering, patient was noted crying and touching between breast. Family thought patient was having chest pain. Patient non verbal but nodded when asked if she was hurting. Patient on heart monitor which was showing normal sinus rhythm. MD paged through the operator. Still awaiting call back.

## 2015-05-10 NOTE — Progress Notes (Signed)
PATIENT DETAILS Name: Kenneth Lee Age: 69 y.o. Sex: male Date of Birth: 1945-12-12 Admit Date: 05/07/2015 Admitting Physician Lavina Hamman, MD AJO:INOMVE,HMCNOB D, MD  Brief Narrative:  69 y.o. male with PMHx of CAD s/p PCI with BMS in 2008, chronic back pain from spinal stenosis admitted with worsening back pain. MRI LS Spine shows severe spinal stenosis with last disc herniation at L2-L3. Seen by neurosurgery, plans are for surgery early this week. Cardiology consulted for preoperative evaluation, echocardiogram demonstrates preserved ejection fraction. Back pain significantly improved with IV steroids.   Subjective: Back pain improved-appears much more comfortable today-no able to lean down in bed.  Assessment/Plan: Principal Problem: Intractable back pain: Secondary to severe spinal stenosis and a large disc herniation at L3-L4 level. Although some concern for possible discitis on MRI-clinically no suggestion of discitis. CRP and ESR only minimally elevated, afebrile with negative blood cultures. Neurosurgery consulted-likely will require surgery early this week. In the meantime, continue steroids, supportive care with as needed narcotics, mobilize with PT. Echocardiogram demonstrates EF around 55-60%-per last cardiology note (Dr. Rickard Patience no LV dysfunction okay for surgery.  Active Problems: Acute diastolic heart failure: Likely causing worsening lower extremity edema, improved with IV Lasix. Dopplers negative for DVT.   Hyponatremia: Suspect secondary to CHF-continue Lasix, sodium stable. Since hyponatremia mild-doubt any need for further workup  History of CAD-status post bare metal stent 2008: Continue aspirin, statin and beta blocker. Without chest pain or shortness of breath-but very sedentary lifestyle for the past few months/years given slowly progressively worsening back pain.  Type 2 diabetes: Continue to hold metformin-CBGs stable with SSI. Monitor  closely as on steroids.  Hypertension: Moderately controlled-continue amlodipine (could be contributing to edema-for now continue), Bystolic, lisinopril. Follow  Dyslipidemia: Continue with Crestor  Obesity: Counseled regarding importance of weight loss  OSA on CPAP: OSA likely secondary to obesity  History of peripheral vascular disease status post aortofemoral bypass.  Disposition: Remain inpatient-suspect will require several more days of hospitalization and back surgery prior to discharge  Antimicrobial agents  See below  Anti-infectives    None      DVT Prophylaxis: Prophylactic Lovenox   Code Status: Full code   Family Communication Friend at bedside  Procedures: None  CONSULTS:  cardiology and Neurosurgery  Time spent 20 minutes-Greater than 50% of this time was spent in counseling, explanation of diagnosis, planning of further management, and coordination of care.  MEDICATIONS: Scheduled Meds: . acetaminophen  1,000 mg Oral Q8H  . amLODipine  5 mg Oral Daily  . aspirin EC  325 mg Oral Daily  . dexamethasone  4 mg Intravenous Q8H  . docusate sodium  100 mg Oral BID  . enoxaparin (LOVENOX) injection  40 mg Subcutaneous Q24H  . furosemide  40 mg Intravenous Daily  . insulin aspart  0-15 Units Subcutaneous TID WC  . lisinopril  20 mg Oral Daily  . magnesium hydroxide  15 mL Oral Once  . nebivolol  10 mg Oral Daily  . pantoprazole  40 mg Oral Q1200  . polyethylene glycol  17 g Oral BID  . potassium chloride  20 mEq Oral Daily  . rosuvastatin  10 mg Oral Daily  . sodium chloride  3 mL Intravenous Q12H   Continuous Infusions:  PRN Meds:.cyclobenzaprine, diazepam, morphine injection, ondansetron **OR** ondansetron (ZOFRAN) IV, oxyCODONE, sodium phosphate    PHYSICAL EXAM: Vital signs in last 24 hours:  Filed Vitals:   05/09/15 2234 05/10/15 0205 05/10/15 0540 05/10/15 0900  BP: 128/46 156/63 160/59 164/67  Pulse: 48 45 43 40  Temp: 97.6 F (36.4  C) 97.4 F (36.3 C) 98.4 F (36.9 C) 97.6 F (36.4 C)  TempSrc: Oral Oral Oral Oral  Resp: '20 20 18 18  ' Height:      Weight:      SpO2: 95% 99% 99% 99%    Weight change:  Filed Weights   05/08/15 0000  Weight: 127.4 kg (280 lb 13.9 oz)   Body mass index is 38.08 kg/(m^2).   Gen Exam: Awake and alert with clear speech.   Neck: Supple, No JVD.   Chest: B/L Clear.   CVS: S1 S2 Regular, no murmurs.  Abdomen: soft, BS +, non tender, non distended.  Extremities: + edema, lower extremities warm to touch-TED hose in place Neurologic: Nonfocal exam Skin: No Rash.   Wounds: N/A.   Intake/Output from previous day:  Intake/Output Summary (Last 24 hours) at 05/10/15 1220 Last data filed at 05/09/15 1700  Gross per 24 hour  Intake    720 ml  Output      0 ml  Net    720 ml     LAB RESULTS: CBC  Recent Labs Lab 05/07/15 2156 05/08/15 0630  WBC 11.2* 11.1*  HGB 15.3 14.5  HCT 44.0 44.2  PLT 170 164  MCV 92.2 94.4  MCH 32.1 31.0  MCHC 34.8 32.8  RDW 12.8 13.2  LYMPHSABS 2.4 2.6  MONOABS 1.0 0.7  EOSABS 0.1 0.1  BASOSABS 0.0 0.0    Chemistries   Recent Labs Lab 05/07/15 2156 05/08/15 0017 05/08/15 0630 05/09/15 0525 05/10/15 0640  NA 128* 134* 131* 129* 130*  K 4.2 4.3 4.5 4.8 4.8  CL 95* 98* 95* 96* 95*  CO2 '24 22 27 25 28  ' GLUCOSE 120* 135* 129* 167* 143*  BUN '15 15 15 17 18  ' CREATININE 0.61 0.72 0.86 0.75 0.78  CALCIUM 9.4 9.8 9.6 9.2 9.4    CBG:  Recent Labs Lab 05/09/15 1123 05/09/15 1628 05/09/15 2238 05/10/15 0640 05/10/15 1126  GLUCAP 184* 202* 267* 152* 150*    GFR Estimated Creatinine Clearance: 118.5 mL/min (by C-G formula based on Cr of 0.78).  Coagulation profile  Recent Labs Lab 05/08/15 0017  INR 1.17    Cardiac Enzymes No results for input(s): CKMB, TROPONINI, MYOGLOBIN in the last 168 hours.  Invalid input(s): CK  Invalid input(s): POCBNP No results for input(s): DDIMER in the last 72 hours. No results for  input(s): HGBA1C in the last 72 hours. No results for input(s): CHOL, HDL, LDLCALC, TRIG, CHOLHDL, LDLDIRECT in the last 72 hours.  Recent Labs  05/08/15 0017  TSH 2.066   No results for input(s): VITAMINB12, FOLATE, FERRITIN, TIBC, IRON, RETICCTPCT in the last 72 hours. No results for input(s): LIPASE, AMYLASE in the last 72 hours.  Urine Studies No results for input(s): UHGB, CRYS in the last 72 hours.  Invalid input(s): UACOL, UAPR, USPG, UPH, UTP, UGL, UKET, UBIL, UNIT, UROB, ULEU, UEPI, UWBC, URBC, UBAC, CAST, UCOM, BILUA  MICROBIOLOGY: Recent Results (from the past 240 hour(s))  Culture, blood (routine x 2)     Status: None (Preliminary result)   Collection Time: 05/07/15  9:50 PM  Result Value Ref Range Status   Specimen Description BLOOD RIGHT WRIST  Final   Special Requests BOTTLES DRAWN AEROBIC AND ANAEROBIC 5CC  Final   Culture NO GROWTH 3 DAYS  Final   Report Status PENDING  Incomplete  Culture, blood (routine x 2)     Status: None (Preliminary result)   Collection Time: 05/07/15  9:56 PM  Result Value Ref Range Status   Specimen Description BLOOD RIGHT HAND  Final   Special Requests BOTTLES DRAWN AEROBIC AND ANAEROBIC 5CC  Final   Culture NO GROWTH 3 DAYS  Final   Report Status PENDING  Incomplete    RADIOLOGY STUDIES/RESULTS: Mr Lumbar Spine Wo Contrast  05/07/2015  CLINICAL DATA:  69 year old male with lumbar back pain, chronic but progressive x3 weeks. Severe pain, unable to have a outpatient lumbar MRI. Subsequent encounter. EXAM: MRI LUMBAR SPINE WITHOUT CONTRAST TECHNIQUE: Multiplanar, multisequence MR imaging of the lumbar spine was performed. No intravenous contrast was administered. COMPARISON:  [facility] lumbar radiographs 04/29/2015. [facility] 02/26/2012. [facility] lumbar MRI 07/14/2009. FINDINGS: The examination had to be discontinued prior to completion due to pain. Levoconvex lumbar scoliosis demonstrated on  the recent radiographs. Normal lumbar segmentation, and this corresponds to the numbering system used on the 2011 comparison. Since 2011 anterolisthesis at L3-L4 and retrolisthesis at L4-L5 have progressed. Severe disc space loss at both levels has progressed. Trace vacuum disc anteriorly at L3-L4 on the recent radiographs. Progressed endplate spurring and sclerosis, maximal at L4-L5. On the sagittal STIR imaging today there is confluent STIR hyperintensity in the L3, L4, and L5 vertebral bodies mostly along the endplates. No suspicious increased T2 or STIR signal identified within the intervening disc spaces. No definite prevertebral/paraspinal STIR hyperintensity. Visualized lower thoracic spinal cord is normal with conus medularis at L1-L2. 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 T12-L1:  Negative. L1-L2: Circumferential disc bulge. Borderline to mild spinal stenosis. L2-L3: Circumferential disc osteophyte complex and mild to moderate facet and ligament flavum hypertrophy. Mild to moderate spinal stenosis, not significantly changed since 2011. L3-L4: Bulky circumferential disc osteophyte complex and progressed severe facet hypertrophy. Severe spinal stenosis here appears increased since 2011. The sagittal images suggest a disc extrusion, sequestered fragment contributing to the appearance. See series 3, image 7 and series 8, image 20. The fragment measures up to 13 mm largest dimension. L4-L5: Severe disc space loss and circumferential disc osteophyte complex. Chronic moderate facet hypertrophy. Still, right lateral recess patency appears improved since 2011. Chronic moderate right greater than left foraminal stenosis appears stable and mostly related to endplate spurring. L5-S1: Left eccentric chronic circumferential disc osteophyte complex appears stable since 2011 along with moderate facet hypertrophy. Moderate left greater than right L5 foraminal stenosis appears stable. IMPRESSION: 1. Prematurely  discontinued and motion degraded study despite maximal patient medication and multiple imaging attempts. 2. Severe spinal stenosis at L3-L4 appears progressed since 2011 and in part related to a sequestered disc fragment best seen on series 3, image 7. Grade 1 anterolisthesis also has progressed at this level along with severe facet hypertrophy. 3. Confluent marrow edema in the L3, L4, and L5 vertebral bodies is favored to be degenerative in nature owing to the progressed and now severe disc degeneration at these levels. Infection is felt less likely but in light of the patient's diabetes, blood cultures and correlation with serologic markers of infection are recommended. Electronically Signed   By: Genevie Ann M.D.   On: 05/07/2015 19:52    Oren Binet, MD  [facility] Pager:[phone]  If 7PM-7AM, please contact night-coverage [email or web address] Password [email or web address] 05/10/2015, 12:20 PM   LOS: 2 days

## 2015-05-10 NOTE — Progress Notes (Signed)
Please disregard the note previously. Noted in error.

## 2015-05-10 NOTE — Progress Notes (Signed)
Echocardiogram 2D Echocardiogram has been performed.  Joelene Millin 05/10/2015, 9:36 AM

## 2015-05-10 NOTE — Progress Notes (Signed)
Rt Note: CPAP pressure increased to 11 per pt request. H2O added to machine. Per pt he can place himself on when ready. I told him to call if he needs anything else.

## 2015-05-11 ENCOUNTER — Inpatient Hospital Stay (HOSPITAL_COMMUNITY): Payer: Medicare Other | Admitting: Anesthesiology

## 2015-05-11 ENCOUNTER — Inpatient Hospital Stay (HOSPITAL_COMMUNITY): Payer: Medicare Other

## 2015-05-11 ENCOUNTER — Encounter (HOSPITAL_COMMUNITY)
Admission: EM | Disposition: A | Discharge status: Home Health | Payer: Medicare Other | Source: Home / Self Care | Attending: Internal Medicine

## 2015-05-11 DIAGNOSIS — I10 Essential (primary) hypertension: Secondary | ICD-10-CM

## 2015-05-11 DIAGNOSIS — E785 Hyperlipidemia, unspecified: Secondary | ICD-10-CM

## 2015-05-11 DIAGNOSIS — I5033 Acute on chronic diastolic (congestive) heart failure: Secondary | ICD-10-CM

## 2015-05-11 LAB — BASIC METABOLIC PANEL
Anion gap: 8 (ref 5–15)
BUN: 19 mg/dL (ref 6–20)
CO2: 27 mmol/L (ref 22–32)
Calcium: 9.7 mg/dL (ref 8.9–10.3)
Chloride: 99 mmol/L — ABNORMAL LOW (ref 101–111)
Creatinine, Ser: 0.72 mg/dL (ref 0.61–1.24)
GFR calc Af Amer: >60 mL/min (ref >60)
GFR calc non Af Amer: >60 mL/min (ref >60)
Glucose, Bld: 202 mg/dL — ABNORMAL HIGH (ref 65–99)
Potassium: 4.6 mmol/L (ref 3.5–5.1)
Sodium: 134 mmol/L — ABNORMAL LOW (ref 135–145)

## 2015-05-11 LAB — GLUCOSE, CAPILLARY
Glucose-Capillary: 196 mg/dL — ABNORMAL HIGH (ref 65–99)
Glucose-Capillary: 145 mg/dL — ABNORMAL HIGH (ref 65–99)
Glucose-Capillary: 130 mg/dL — ABNORMAL HIGH (ref 65–99)
Glucose-Capillary: 145 mg/dL — ABNORMAL HIGH (ref 65–99)

## 2015-05-11 SURGERY — POSTERIOR LUMBAR FUSION 2 LEVEL
Anesthesia: General | Site: Back | Laterality: Bilateral

## 2015-05-11 MED ORDER — ROCURONIUM BROMIDE 50 MG/5ML IV SOLN
INTRAVENOUS | Status: AC
  Filled 2015-05-11: qty 1

## 2015-05-11 MED ORDER — GLYCOPYRROLATE 0.2 MG/ML IJ SOLN
INTRAMUSCULAR | Status: DC | PRN
  Administered 2015-05-11: 0.2 mg via INTRAVENOUS

## 2015-05-11 MED ORDER — THROMBIN 20000 UNITS EX SOLR
CUTANEOUS | Status: DC | PRN
  Administered 2015-05-11: 19:00:00 via TOPICAL

## 2015-05-11 MED ORDER — FENTANYL CITRATE (PF) 100 MCG/2ML IJ SOLN
INTRAMUSCULAR | Status: DC | PRN
  Administered 2015-05-11: 50 ug via INTRAVENOUS
  Administered 2015-05-11: 100 ug via INTRAVENOUS
  Administered 2015-05-11: 50 ug via INTRAVENOUS
  Administered 2015-05-11: 100 ug via INTRAVENOUS
  Administered 2015-05-11: 50 ug via INTRAVENOUS

## 2015-05-11 MED ORDER — 0.9 % SODIUM CHLORIDE (POUR BTL) OPTIME
TOPICAL | Status: DC | PRN
  Administered 2015-05-11: 1000 mL

## 2015-05-11 MED ORDER — THROMBIN 5000 UNITS EX SOLR
OROMUCOSAL | Status: DC | PRN
  Administered 2015-05-11: 19:00:00 via TOPICAL

## 2015-05-11 MED ORDER — OXYCODONE HCL 5 MG/5ML PO SOLN: 5.0000 mg | Freq: Once | ORAL | Status: DC | PRN

## 2015-05-11 MED ORDER — SUFENTANIL CITRATE 50 MCG/ML IV SOLN
INTRAVENOUS | Status: AC
  Filled 2015-05-11: qty 1

## 2015-05-11 MED ORDER — HYDROMORPHONE HCL 1 MG/ML IJ SOLN
INTRAMUSCULAR | Status: AC
  Filled 2015-05-11: qty 1

## 2015-05-11 MED ORDER — SUFENTANIL CITRATE 50 MCG/ML IV SOLN
50.0000 ug | INTRAVENOUS | Status: DC | PRN
  Administered 2015-05-11: .2 ug/kg/h via INTRAVENOUS

## 2015-05-11 MED ORDER — SUGAMMADEX SODIUM 200 MG/2ML IV SOLN
INTRAVENOUS | Status: DC | PRN
  Administered 2015-05-11: 200 mg via INTRAVENOUS

## 2015-05-11 MED ORDER — PROPOFOL 10 MG/ML IV BOLUS
INTRAVENOUS | Status: AC
  Filled 2015-05-11: qty 20

## 2015-05-11 MED ORDER — VANCOMYCIN HCL IN DEXTROSE 1-5 GM/200ML-% IV SOLN
INTRAVENOUS | Status: AC
  Administered 2015-05-11: 1000 mg via INTRAVENOUS
  Filled 2015-05-11: qty 200

## 2015-05-11 MED ORDER — FENTANYL CITRATE (PF) 250 MCG/5ML IJ SOLN
INTRAMUSCULAR | Status: AC
  Filled 2015-05-11: qty 5

## 2015-05-11 MED ORDER — KETOROLAC TROMETHAMINE 15 MG/ML IJ SOLN
INTRAMUSCULAR | Status: AC
  Administered 2015-05-12: 15 mg via INTRAVENOUS
  Filled 2015-05-11: qty 1

## 2015-05-11 MED ORDER — ONDANSETRON HCL 4 MG/2ML IJ SOLN
INTRAMUSCULAR | Status: AC
  Filled 2015-05-11: qty 2

## 2015-05-11 MED ORDER — SUCCINYLCHOLINE CHLORIDE 20 MG/ML IJ SOLN
INTRAMUSCULAR | Status: DC | PRN
  Administered 2015-05-11: 120 mg via INTRAVENOUS

## 2015-05-11 MED ORDER — BACITRACIN 50000 UNITS IM SOLR
INTRAMUSCULAR | Status: DC | PRN
  Administered 2015-05-11: 19:00:00

## 2015-05-11 MED ORDER — LACTATED RINGERS IV SOLN
INTRAVENOUS | Status: DC
  Administered 2015-05-11: 09:00:00 via INTRAVENOUS

## 2015-05-11 MED ORDER — ROCURONIUM BROMIDE 100 MG/10ML IV SOLN
INTRAVENOUS | Status: DC | PRN
  Administered 2015-05-11: 20 mg via INTRAVENOUS
  Administered 2015-05-11: 50 mg via INTRAVENOUS
  Administered 2015-05-11: 20 mg via INTRAVENOUS
  Administered 2015-05-11: 10 mg via INTRAVENOUS
  Administered 2015-05-11: 20 mg via INTRAVENOUS
  Administered 2015-05-11 (×2): 10 mg via INTRAVENOUS
  Administered 2015-05-11: 20 mg via INTRAVENOUS

## 2015-05-11 MED ORDER — LIDOCAINE-EPINEPHRINE 1 %-1:100000 IJ SOLN
INTRAMUSCULAR | Status: DC | PRN
  Administered 2015-05-11: 5 mL

## 2015-05-11 MED ORDER — DEXAMETHASONE SODIUM PHOSPHATE 10 MG/ML IJ SOLN
INTRAMUSCULAR | Status: AC
  Filled 2015-05-11: qty 1

## 2015-05-11 MED ORDER — MIDAZOLAM HCL 5 MG/5ML IJ SOLN
INTRAMUSCULAR | Status: DC | PRN
  Administered 2015-05-11: 2 mg via INTRAVENOUS

## 2015-05-11 MED ORDER — HYDRALAZINE HCL 20 MG/ML IJ SOLN
INTRAMUSCULAR | Status: DC | PRN
  Administered 2015-05-11: 5 mg via INTRAVENOUS

## 2015-05-11 MED ORDER — HYDROMORPHONE HCL 1 MG/ML IJ SOLN
0.2500 mg | INTRAMUSCULAR | Status: DC | PRN
  Administered 2015-05-11 – 2015-05-12 (×4): 0.5 mg via INTRAVENOUS

## 2015-05-11 MED ORDER — PROMETHAZINE HCL 25 MG/ML IJ SOLN: 6.2500 mg | INTRAMUSCULAR | Status: DC | PRN

## 2015-05-11 MED ORDER — LACTATED RINGERS IV SOLN
INTRAVENOUS | Status: DC | PRN
  Administered 2015-05-11 (×2): via INTRAVENOUS

## 2015-05-11 MED ORDER — SUGAMMADEX SODIUM 200 MG/2ML IV SOLN
INTRAVENOUS | Status: AC
  Filled 2015-05-11: qty 2

## 2015-05-11 MED ORDER — HYDRALAZINE HCL 20 MG/ML IJ SOLN
INTRAMUSCULAR | Status: AC
  Filled 2015-05-11: qty 1

## 2015-05-11 MED ORDER — LACTATED RINGERS IV SOLN
INTRAVENOUS | Status: AC
  Administered 2015-05-11: 12:00:00 via INTRAVENOUS

## 2015-05-11 MED ORDER — KETOROLAC TROMETHAMINE 15 MG/ML IJ SOLN
15.0000 mg | Freq: Four times a day (QID) | INTRAMUSCULAR | Status: AC
  Administered 2015-05-11 – 2015-05-13 (×5): 15 mg via INTRAVENOUS
  Filled 2015-05-11 (×5): qty 1

## 2015-05-11 MED ORDER — LIDOCAINE HCL (CARDIAC) 20 MG/ML IV SOLN
INTRAVENOUS | Status: DC | PRN
  Administered 2015-05-11: 100 mg via INTRAVENOUS

## 2015-05-11 MED ORDER — MIDAZOLAM HCL 2 MG/2ML IJ SOLN
INTRAMUSCULAR | Status: AC
  Filled 2015-05-11: qty 2

## 2015-05-11 MED ORDER — DEXAMETHASONE SODIUM PHOSPHATE 10 MG/ML IJ SOLN
INTRAMUSCULAR | Status: DC | PRN
  Administered 2015-05-11: 10 mg via INTRAVENOUS

## 2015-05-11 MED ORDER — OXYCODONE HCL 5 MG PO TABS: 5.0000 mg | ORAL_TABLET | Freq: Once | ORAL | Status: DC | PRN

## 2015-05-11 MED ORDER — NEBIVOLOL HCL 5 MG PO TABS
5.0000 mg | ORAL_TABLET | Freq: Every day | ORAL | Status: DC
  Administered 2015-05-12 – 2015-05-13 (×2): 5 mg via ORAL
  Filled 2015-05-11 (×3): qty 1

## 2015-05-11 MED ORDER — PROPOFOL 10 MG/ML IV BOLUS
INTRAVENOUS | Status: DC | PRN
  Administered 2015-05-11: 200 mg via INTRAVENOUS

## 2015-05-11 MED ORDER — ONDANSETRON HCL 4 MG/2ML IJ SOLN
INTRAMUSCULAR | Status: DC | PRN
  Administered 2015-05-11: 4 mg via INTRAVENOUS

## 2015-05-11 MED ORDER — EPHEDRINE SULFATE 50 MG/ML IJ SOLN
INTRAMUSCULAR | Status: DC | PRN
  Administered 2015-05-11 (×3): 10 mg via INTRAVENOUS

## 2015-05-11 MED ORDER — BUPIVACAINE HCL (PF) 0.5 % IJ SOLN
INTRAMUSCULAR | Status: DC | PRN
  Administered 2015-05-11: 25 mL
  Administered 2015-05-11: 5 mL

## 2015-05-11 MED ORDER — SODIUM CHLORIDE 0.9 % IV SOLN
INTRAVENOUS | Status: DC
  Administered 2015-05-12: via INTRAVENOUS

## 2015-05-11 MED ORDER — LIDOCAINE HCL (CARDIAC) 20 MG/ML IV SOLN
INTRAVENOUS | Status: AC
  Filled 2015-05-11: qty 5

## 2015-05-11 SURGICAL SUPPLY — 65 items
BAG DECANTER FOR FLEXI CONT (MISCELLANEOUS) ×3 IMPLANT
BLADE CLIPPER SURG (BLADE) IMPLANT
BONE CANC CHIPS 20CC PCAN1/4 (Bone Implant) ×3 IMPLANT
BUR MATCHSTICK NEURO 3.0 LAGG (BURR) ×3 IMPLANT
CAGE COROENT LRG 8X9X28M SPINE (Cage) ×6 IMPLANT
CAGE COROENT MP 8X23 (Cage) ×6 IMPLANT
CANISTER SUCT 3000ML PPV (MISCELLANEOUS) ×3 IMPLANT
CHIPS CANC BONE 20CC PCAN1/4 (Bone Implant) ×1 IMPLANT
CONT SPEC 4OZ CLIKSEAL STRL BL (MISCELLANEOUS) ×3 IMPLANT
COVER BACK TABLE 60X90IN (DRAPES) ×3 IMPLANT
DECANTER SPIKE VIAL GLASS SM (MISCELLANEOUS) ×3 IMPLANT
DERMABOND ADVANCED (GAUZE/BANDAGES/DRESSINGS) ×2
DERMABOND ADVANCED .7 DNX12 (GAUZE/BANDAGES/DRESSINGS) ×1 IMPLANT
DRAPE C-ARM 42X72 X-RAY (DRAPES) ×6 IMPLANT
DRAPE LAPAROTOMY 100X72X124 (DRAPES) ×3 IMPLANT
DRAPE POUCH INSTRU U-SHP 10X18 (DRAPES) ×3 IMPLANT
DRAPE PROXIMA HALF (DRAPES) IMPLANT
DURAPREP 26ML APPLICATOR (WOUND CARE) ×3 IMPLANT
ELECT REM PT RETURN 9FT ADLT (ELECTROSURGICAL) ×3
ELECTRODE REM PT RTRN 9FT ADLT (ELECTROSURGICAL) ×1 IMPLANT
GAUZE SPONGE 4X4 12PLY STRL (GAUZE/BANDAGES/DRESSINGS) ×3 IMPLANT
GAUZE SPONGE 4X4 16PLY XRAY LF (GAUZE/BANDAGES/DRESSINGS) ×3 IMPLANT
GLOVE BIO SURGEON STRL SZ 6.5 (GLOVE) ×6 IMPLANT
GLOVE BIO SURGEON STRL SZ7 (GLOVE) ×6 IMPLANT
GLOVE BIO SURGEONS STRL SZ 6.5 (GLOVE) ×3
GLOVE BIOGEL PI IND STRL 8.5 (GLOVE) ×3 IMPLANT
GLOVE BIOGEL PI INDICATOR 8.5 (GLOVE) ×6
GLOVE ECLIPSE 8.5 STRL (GLOVE) ×12 IMPLANT
GLOVE EXAM NITRILE LRG STRL (GLOVE) IMPLANT
GLOVE EXAM NITRILE MD LF STRL (GLOVE) IMPLANT
GLOVE EXAM NITRILE XL STR (GLOVE) IMPLANT
GLOVE EXAM NITRILE XS STR PU (GLOVE) IMPLANT
GLOVE INDICATOR 6.5 STRL GRN (GLOVE) ×6 IMPLANT
GLOVE INDICATOR 7.5 STRL GRN (GLOVE) ×3 IMPLANT
GOWN STRL REUS W/ TWL LRG LVL3 (GOWN DISPOSABLE) ×4 IMPLANT
GOWN STRL REUS W/ TWL XL LVL3 (GOWN DISPOSABLE) IMPLANT
GOWN STRL REUS W/TWL 2XL LVL3 (GOWN DISPOSABLE) ×9 IMPLANT
GOWN STRL REUS W/TWL LRG LVL3 (GOWN DISPOSABLE) ×8
GOWN STRL REUS W/TWL XL LVL3 (GOWN DISPOSABLE)
HEMOSTAT POWDER KIT SURGIFOAM (HEMOSTASIS) IMPLANT
KIT BASIN OR (CUSTOM PROCEDURE TRAY) ×3 IMPLANT
KIT ROOM TURNOVER OR (KITS) ×3 IMPLANT
NEEDLE HYPO 22GX1.5 SAFETY (NEEDLE) ×3 IMPLANT
NEEDLE SPNL 18GX3.5 QUINCKE PK (NEEDLE) IMPLANT
NS IRRIG 1000ML POUR BTL (IV SOLUTION) ×3 IMPLANT
PACK LAMINECTOMY NEURO (CUSTOM PROCEDURE TRAY) ×3 IMPLANT
PAD ARMBOARD 7.5X6 YLW CONV (MISCELLANEOUS) ×9 IMPLANT
PATTIES SURGICAL .5 X1 (DISPOSABLE) ×3 IMPLANT
ROD RELIN-O LORD 5.5X65MM (Rod) ×6 IMPLANT
SCREW LOCK RELINE 5.5 TULIP (Screw) ×18 IMPLANT
SCREW RELINE-O POLY 6.5X45 (Screw) ×18 IMPLANT
SPONGE LAP 4X18 X RAY DECT (DISPOSABLE) ×3 IMPLANT
SPONGE SURGIFOAM ABS GEL 100 (HEMOSTASIS) ×3 IMPLANT
SUT PROLENE 6 0 BV (SUTURE) ×3 IMPLANT
SUT VIC AB 1 CT1 18XBRD ANBCTR (SUTURE) ×2 IMPLANT
SUT VIC AB 1 CT1 8-18 (SUTURE) ×4
SUT VIC AB 2-0 CP2 18 (SUTURE) ×6 IMPLANT
SUT VIC AB 3-0 SH 8-18 (SUTURE) ×6 IMPLANT
SYR 3ML LL SCALE MARK (SYRINGE) ×12 IMPLANT
SYR 5ML LL (SYRINGE) IMPLANT
TOWEL OR 17X24 6PK STRL BLUE (TOWEL DISPOSABLE) ×3 IMPLANT
TOWEL OR 17X26 10 PK STRL BLUE (TOWEL DISPOSABLE) ×3 IMPLANT
TRAP SPECIMEN MUCOUS 40CC (MISCELLANEOUS) ×3 IMPLANT
TRAY FOLEY W/METER SILVER 14FR (SET/KITS/TRAYS/PACK) ×3 IMPLANT
WATER STERILE IRR 1000ML POUR (IV SOLUTION) ×3 IMPLANT

## 2015-05-11 NOTE — Anesthesia Procedure Notes (Addendum)
Procedure Name: Intubation Date/Time: 05/11/2015 6:04 PM Performed by: Layla Maw Pre-anesthesia Checklist: Patient identified, Timeout performed, Emergency Drugs available, Suction available and Patient being monitored Patient Re-evaluated:Patient Re-evaluated prior to inductionOxygen Delivery Method: Circle system utilized Preoxygenation: Pre-oxygenation with 100% oxygen Intubation Type: IV induction Ventilation: Two handed mask ventilation required and Oral airway inserted - appropriate to patient size Laryngoscope Size: Miller and 3 Grade View: Grade III Tube size: 8.0 mm Number of attempts: 2 Airway Equipment and Method: Bougie stylet and Stylet Placement Confirmation: ETT inserted through vocal cords under direct vision and breath sounds checked- equal and bilateral Secured at: 22 cm Tube secured with: Tape Dental Injury: Teeth and Oropharynx as per pre-operative assessment  Comments: First DL with Miller 3 resulted in esophageal intubation. Immediately recognized and removed. Second DL grade 3 view, bougie inserted without difficulty and ETT gently advanced over bougie and bougie removed. Recommend considering VLG on future GETA.

## 2015-05-11 NOTE — Progress Notes (Addendum)
PATIENT ID: [id number] with CAD s/p PCI RCA in 5397, chronic diastolic heart failure, hypertension, hyperlipidemia, DM, and OSA awaiting back surgery.  INTERVAL HISTORY: No events.    SUBJECTIVE:  He denies any chest pain or shortness of breath.  Edema improved.  He has been walking without symptoms.  He denies lightheadedness or dizziness.   PHYSICAL EXAM Filed Vitals:   05/10/15 1748 05/10/15 2145 05/11/15 0219 05/11/15 0537  BP: 160/60 152/60 141/45 144/57  Pulse: 46 42 44 42  Temp: 97.9 F (36.6 C) 97.7 F (36.5 C) 97.5 F (36.4 C) 98.2 F (36.8 C)  TempSrc: Oral Oral Oral Oral  Resp: 18 20 18 18   Height:      Weight:      SpO2: 99% 97% 97% 98%   General:  Well-appearing.  NAD. Lungs:  CTAB. No crackles, wheezes or rhonchi. Heart:  Bradycardic.  Regular rhythm.  No m/r/g.  JVP 1 cm above clavicle sitting upright. Abdomen:  Soft, NT, ND.  +BS Extremities:  WWP.  No edema.   LABS: Lab Results  Component Value Date   TROPONINI 0.02        NO INDICATION OF MYOCARDIAL INJURY. 03/29/2007   Results for orders placed or performed during the hospital encounter of 05/07/15 (from the past 24 hour(s))  Glucose, capillary     Status: Abnormal   Collection Time: 05/10/15 11:26 AM  Result Value Ref Range   Glucose-Capillary 150 (H) 65 - 99 mg/dL  Urinalysis, Routine w reflex microscopic (not at [facility])     Status: Abnormal   Collection Time: 05/10/15  2:09 PM  Result Value Ref Range   Color, Urine YELLOW YELLOW   APPearance CLEAR CLEAR   Specific Gravity, Urine 1.015 1.005 - 1.030   pH 5.5 5.0 - 8.0   Glucose, UA 100 (A) NEGATIVE mg/dL   Hgb urine dipstick NEGATIVE NEGATIVE   Bilirubin Urine NEGATIVE NEGATIVE   Ketones, ur NEGATIVE NEGATIVE mg/dL   Protein, ur NEGATIVE NEGATIVE mg/dL   Urobilinogen, UA 1.0 0.0 - 1.0 mg/dL   Nitrite NEGATIVE NEGATIVE   Leukocytes, UA NEGATIVE NEGATIVE  Glucose, capillary     Status: Abnormal   Collection Time: 05/10/15  4:43 PM  Result Value  Ref Range   Glucose-Capillary 261 (H) 65 - 99 mg/dL  Glucose, capillary     Status: Abnormal   Collection Time: 05/10/15  9:45 PM  Result Value Ref Range   Glucose-Capillary 272 (H) 65 - 99 mg/dL   Comment 1 Notify RN    Comment 2 Document in Chart   Basic metabolic panel     Status: Abnormal   Collection Time: 05/11/15  2:25 AM  Result Value Ref Range   Sodium 134 (L) 135 - 145 mmol/L   Potassium 4.6 3.5 - 5.1 mmol/L   Chloride 99 (L) 101 - 111 mmol/L   CO2 27 22 - 32 mmol/L   Glucose, Bld 202 (H) 65 - 99 mg/dL   BUN 19 6 - 20 mg/dL   Creatinine, Ser 0.72 0.61 - 1.24 mg/dL   Calcium 9.7 8.9 - 10.3 mg/dL   GFR calc non Af Amer >60 >60 mL/min   GFR calc Af Amer >60 >60 mL/min   Anion gap 8 5 - 15  Glucose, capillary     Status: Abnormal   Collection Time: 05/11/15  6:48 AM  Result Value Ref Range   Glucose-Capillary 196 (H) 65 - 99 mg/dL   Comment 1 Notify RN  Comment 2 Document in Chart     Intake/Output Summary (Last 24 hours) at 05/11/15 1007 Last data filed at 05/10/15 2100  Gross per 24 hour  Intake    960 ml  Output      0 ml  Net    960 ml    EKG: sinus bradycardia rate 51 bpm.  First degree heart block.  Echo 05/10/15: Study Conclusions  - Left ventricle: The cavity size was normal. Systolic function was normal. The estimated ejection fraction was in the range of 55% to 60%. Wall motion was normal; there were no regional wall motion abnormalities. Features are consistent with a pseudonormal left ventricular filling pattern, with concomitant abnormal relaxation and increased filling pressure (grade 2 diastolic dysfunction). Doppler parameters are consistent with high ventricular filling pressure. - Aortic valve: Moderate focal calcification involving the noncoronary cusp. - Mitral valve: There was trivial regurgitation. - Atrial septum: There was increased thickness of the septum, consistent with lipomatous hypertrophy. Echo contrast  study showed no right-to-left atrial level shunt, at baseline or with provocation.  ASSESSMENT AND PLAN:  Principal Problem:   Back pain Active Problems:   CAD (coronary artery disease), native coronary artery   Essential hypertension   Hyperlipidemia with target LDL less than 70   Type 2 diabetes mellitus not at goal ([place])   OSA on CPAP   # Acute on chronic diastolic heart failure:  Edema is significantly improved.  Echo showed grade 2 diastolic dysfunction.  Assuming there aren't large fluid shifts with surgery, he can likely switch to oral lasix tomorrow.  # CAD s/p PCI: Clinically stable. Continue aspirin, nebivolol and rosuvastatin.  # Hypertension: BP well-controlled.  - Continue amlodipine, lisinopril, and nebivolol.  # Bradycardia: Asymptomatic with heart rates in the 40s.  Will continue to monitor.  Sharol Harness, MD 05/11/2015 10:07 AM

## 2015-05-11 NOTE — Progress Notes (Signed)
PATIENT DETAILS Name: Kenneth Lee Age: 69 y.o. Sex: male Date of Birth: 07/12/1945 Admit Date: 05/07/2015 Admitting Physician Lavina Hamman, MD WER:[id number] D, MD  Brief Narrative:   69 y.o. male with PMHx of CAD s/p PCI with BMS in 2008, chronic back pain from spinal stenosis admitted with worsening back pain. MRI LS Spine shows severe spinal stenosis with last disc herniation at L2-L3. Seen by neurosurgery, plans are for surgery early this week. Cardiology consulted for preoperative evaluation, echocardiogram demonstrates preserved ejection fraction. Back pain significantly improved with IV steroids.   Subjective: Sitting in chair, denies any headache, no chest or abdominal pain, no shortness of breath. Continues to have low back pain. No focal weakness.  Assessment/Plan:  Intractable back pain: Secondary to severe spinal stenosis and a large disc herniation at L3-L4 level. Although some concern for possible discitis on MRI-clinically no suggestion of discitis. CRP and ESR only minimally elevated, afebrile with negative blood cultures. Neurosurgery will go to OR on 05/11/2015 4 PM. In the meantime, continue steroids, supportive care with as needed narcotics, mobilize with PT. Echocardiogram demonstrates EF around 55-60%-per last cardiology note (Dr. Candyce Churn for surgery. He will be a moderate risk for adverse cardiopulmonary outcome during the perioperative period, this was explained to patient and family bedside who agree with the risks and benefits and wanted to proceed with surgery.   Acute on chronic diastolic heart failure EF 55-60%: Likely causing worsening lower extremity edema, improved with IV Lasix. His salt and fluid restriction, lower extremity venous duplex negative for DVT.    Hyponatremia: Suspect secondary to CHF-continue Lasix, sodium stable. Since hyponatremia mild-doubt any need for further workup.  History of CAD-status post bare  metal stent 2008: Continue aspirin, statin and beta blocker. Without chest pain or shortness of breath-but very sedentary lifestyle for the past few months/years given slowly progressively worsening back pain. Reduced Bystolic dose as resting heart rate in mid 40s.  Hypertension: Moderately controlled- dicontinue amlodipine has could be contributing to fluid retention or edema, reduced Bystolic dose continue lisinopril and monitor. If needed will add hydralazine.  Dyslipidemia: Continue with Crestor  Obesity: Counseled regarding importance of weight loss  OSA on CPAP: OSA likely secondary to obesity  History of peripheral vascular disease status post aortofemoral bypass.  Incidental enlarged lymph nodes in the right leg. Outpatient follow-up with PCP.  Type 2 diabetes: Continue to hold metformin-CBGs stable with SSI. Monitor closely as on steroids.  No results found for: HGBA1C  CBG (last 3)   Recent Labs  05/10/15 1643 05/10/15 2145 05/11/15 0648  GLUCAP 261* 272* 196*      Disposition: Remain inpatient-suspect will require several more days of hospitalization and back surgery prior to discharge  Antimicrobial agents  See below  Anti-infectives    None      DVT Prophylaxis: Prophylactic Lovenox   Code Status: Full code   Family Communication Friend at bedside  Procedures:  TTE  - Left ventricle: The cavity size was normal. Systolic function wasnormal. The estimated ejection fraction was in the range of 55%to 60%. Wall motion was normal; there were no regional wallmotion abnormalities. Features are consistent with a pseudonormalleft ventricular filling pattern, with concomitant abnormalrelaxation and increased filling pressure (grade 2 diastolicdysfunction). Doppler parameters are consistent with highventricular filling pressure. - Aortic valve: Moderate focal calcification involving thenoncoronary cusp. - Mitral valve: There was trivial  regurgitation. - Atrial septum:  There was increased thickness of the septum,consistent with lipomatous hypertrophy. Echo contrast studyshowed no right-to-left atrial level shunt, at baseline or withprovocation.   Leg [place]  No evidence of deep vein thrombosis involving the visualized veins of the right lower extremity and left lower extremity. Incidental findings are consistent with: enlarged lymph node on the right. - No evidence of Baker&'s cyst on the right or left.   CONSULTS:  cardiology and Neurosurgery  Time spent 20 minutes-Greater than 50% of this time was spent in counseling, explanation of diagnosis, planning of further management, and coordination of care.  MEDICATIONS: Scheduled Meds: . acetaminophen  1,000 mg Oral Q8H  . amLODipine  5 mg Oral Daily  . aspirin EC  325 mg Oral Daily  . dexamethasone  4 mg Intravenous Q8H  . docusate sodium  100 mg Oral BID  . enoxaparin (LOVENOX) injection  40 mg Subcutaneous Q24H  . furosemide  40 mg Intravenous Daily  . insulin aspart  0-15 Units Subcutaneous TID WC  . lisinopril  20 mg Oral Daily  . magnesium hydroxide  15 mL Oral Once  . nebivolol  10 mg Oral Daily  . pantoprazole  40 mg Oral Q1200  . polyethylene glycol  17 g Oral BID  . potassium chloride  20 mEq Oral Daily  . rosuvastatin  10 mg Oral Daily  . sodium chloride  3 mL Intravenous Q12H   Continuous Infusions: . lactated ringers 75 mL/hr at 05/11/15 0902   PRN Meds:.cyclobenzaprine, diazepam, morphine injection, ondansetron **OR** ondansetron (ZOFRAN) IV, oxyCODONE, sodium phosphate    PHYSICAL EXAM: Vital signs in last 24 hours: Filed Vitals:   05/10/15 2145 05/11/15 0219 05/11/15 0537 05/11/15 1020  BP: 152/60 141/45 144/57 137/66  Pulse: 42 44 42 45  Temp: 97.7 F (36.5 C) 97.5 F (36.4 C) 98.2 F (36.8 C) 98.1 F (36.7 C)  TempSrc: Oral Oral Oral Oral  Resp: _0 Height:      Weight:      SpO2: 97% 97% 98% 98%    Weight change:   Filed Weights   05/08/15 0000  Weight: 127.4 kg (280 lb 13.9 oz)   Body mass index is 38.08 kg/(m^2).   Gen Exam: Awake and alert with clear speech.   Neck: Supple, No JVD.   Chest: B/L Clear.   CVS: S1 S2 Regular, no murmurs.  Abdomen: soft, BS +, non tender, non distended.  Extremities: + edema, lower extremities warm to touch-TED hose in place Neurologic: Nonfocal exam Skin: No Rash.   Wounds: N/A.   Intake/Output from previous day:  Intake/Output Summary (Last 24 hours) at 05/11/15 1136 Last data filed at 05/10/15 2100  Gross per 24 hour  Intake    960 ml  Output      0 ml  Net    960 ml     LAB RESULTS: CBC  Recent Labs Lab 05/07/15 2156 05/08/15 0630  WBC 11.2* 11.1*  HGB 15.3 14.5  HCT 44.0 44.2  PLT 170 164  MCV 92.2 94.4  MCH 32.1 31.0  MCHC 34.8 32.8  RDW 12.8 13.2  LYMPHSABS 2.4 2.6  MONOABS 1.0 0.7  EOSABS 0.1 0.1  BASOSABS 0.0 0.0    Chemistries   Recent Labs Lab 05/08/15 0017 05/08/15 0630 05/09/15 0525 05/10/15 0640 05/11/15 0225  NA 134* 131* 129* 130* 134*  K 4.3 4.5 4.8 4.8 4.6  CL 98* 95* 96* 95* 99*  CO2 _1 27  GLUCOSE 135* 129* 167* 143* 202*  BUN _0 CREATININE 0.72 0.86 0.75 0.78 0.72  CALCIUM 9.8 9.6 9.2 9.4 9.7    CBG:  Recent Labs Lab 05/10/15 0640 05/10/15 1126 05/10/15 1643 05/10/15 2145 05/11/15 0648  GLUCAP 152* 150* 261* 272* 196*    GFR Estimated Creatinine Clearance: 118.5 mL/min (by C-G formula based on Cr of 0.72).  Coagulation profile  Recent Labs Lab 05/08/15 0017  INR 1.17    Cardiac Enzymes No results for input(s): CKMB, TROPONINI, MYOGLOBIN in the last 168 hours.  Invalid input(s): CK  Invalid input(s): POCBNP No results for input(s): DDIMER in the last 72 hours. No results for input(s): HGBA1C in the last 72 hours. No results for input(s): CHOL, HDL, LDLCALC, TRIG, CHOLHDL, LDLDIRECT in the last 72 hours. No results for input(s): TSH, T4TOTAL, T3FREE,  THYROIDAB in the last 72 hours.  Invalid input(s): FREET3 No results for input(s): VITAMINB12, FOLATE, FERRITIN, TIBC, IRON, RETICCTPCT in the last 72 hours. No results for input(s): LIPASE, AMYLASE in the last 72 hours.  Urine Studies No results for input(s): UHGB, CRYS in the last 72 hours.  Invalid input(s): UACOL, UAPR, USPG, UPH, UTP, UGL, UKET, UBIL, UNIT, UROB, ULEU, UEPI, UWBC, URBC, UBAC, CAST, UCOM, BILUA  MICROBIOLOGY: Recent Results (from the past 240 hour(s))  Culture, blood (routine x 2)     Status: None (Preliminary result)   Collection Time: 05/07/15  9:50 PM  Result Value Ref Range Status   Specimen Description BLOOD RIGHT WRIST  Final   Special Requests BOTTLES DRAWN AEROBIC AND ANAEROBIC 5CC  Final   Culture NO GROWTH 4 DAYS  Final   Report Status PENDING  Incomplete  Culture, blood (routine x 2)     Status: None (Preliminary result)   Collection Time: 05/07/15  9:56 PM  Result Value Ref Range Status   Specimen Description BLOOD RIGHT HAND  Final   Special Requests BOTTLES DRAWN AEROBIC AND ANAEROBIC 5CC  Final   Culture NO GROWTH 4 DAYS  Final   Report Status PENDING  Incomplete    RADIOLOGY STUDIES/RESULTS: Mr Lumbar Spine Wo Contrast  05/07/2015  CLINICAL DATA:  69 year old male with lumbar back pain, chronic but progressive x3 weeks. Severe pain, unable to have a outpatient lumbar MRI. Subsequent encounter. EXAM: MRI LUMBAR SPINE WITHOUT CONTRAST TECHNIQUE: Multiplanar, multisequence MR imaging of the lumbar spine was performed. No intravenous contrast was administered. COMPARISON:  [facility] lumbar radiographs 04/29/2015. [facility] lumbar MRI 02/26/2012. [facility] lumbar MRI 07/14/2009. FINDINGS: The examination had to be discontinued prior to completion due to pain. Levoconvex lumbar scoliosis demonstrated on the recent radiographs. Normal lumbar segmentation, and this corresponds to the numbering system used on the 2011  comparison. Since 2011 anterolisthesis at L3-L4 and retrolisthesis at L4-L5 have progressed. Severe disc space loss at both levels has progressed. Trace vacuum disc anteriorly at L3-L4 on the recent radiographs. Progressed endplate spurring and sclerosis, maximal at L4-L5. On the sagittal STIR imaging today there is confluent STIR hyperintensity in the L3, L4, and L5 vertebral bodies mostly along the endplates. No suspicious increased T2 or STIR signal identified within the intervening disc spaces. No definite prevertebral/paraspinal STIR hyperintensity. Visualized lower thoracic spinal cord is normal with conus medularis at L1-L2. 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 T12-L1:  Negative. L1-L2: Circumferential disc bulge. Borderline to mild spinal stenosis. L2-L3: Circumferential disc osteophyte complex and mild to moderate facet and ligament flavum hypertrophy. Mild to  moderate spinal stenosis, not significantly changed since 2011. L3-L4: Bulky circumferential disc osteophyte complex and progressed severe facet hypertrophy. Severe spinal stenosis here appears increased since 2011. The sagittal images suggest a disc extrusion, sequestered fragment contributing to the appearance. See series 3, image 7 and series 8, image 20. The fragment measures up to 13 mm largest dimension. L4-L5: Severe disc space loss and circumferential disc osteophyte complex. Chronic moderate facet hypertrophy. Still, right lateral recess patency appears improved since 2011. Chronic moderate right greater than left foraminal stenosis appears stable and mostly related to endplate spurring. L5-S1: Left eccentric chronic circumferential disc osteophyte complex appears stable since 2011 along with moderate facet hypertrophy. Moderate left greater than right L5 foraminal stenosis appears stable. IMPRESSION: 1. Prematurely discontinued and motion degraded study despite maximal patient medication and multiple imaging attempts. 2. Severe spinal  stenosis at L3-L4 appears progressed since 2011 and in part related to a sequestered disc fragment best seen on series 3, image 7. Grade 1 anterolisthesis also has progressed at this level along with severe facet hypertrophy. 3. Confluent marrow edema in the L3, L4, and L5 vertebral bodies is favored to be degenerative in nature owing to the progressed and now severe disc degeneration at these levels. Infection is felt less likely but in light of the patient's diabetes, blood cultures and correlation with serologic markers of infection are recommended. Electronically Signed   By: Genevie Ann M.D.   On: 05/07/2015 19:52    Thurnell Lose, MD  [facility] Pager:[phone]  If 7PM-7AM, please contact night-coverage [email or web address] Password [email or web address] 05/11/2015, 11:36 AM   LOS: 3 days

## 2015-05-11 NOTE — Anesthesia Preprocedure Evaluation (Addendum)
Anesthesia Evaluation  Patient identified by MRN, date of birth, ID band Patient awake    Reviewed: Allergy & Precautions, NPO status , Patient's Chart, lab work & pertinent test results, reviewed documented beta blocker date and time   Airway Mallampati: II  TM Distance: >3 FB Neck ROM: Full    Dental  (+) Dental Advisory Given, Teeth Intact   Pulmonary sleep apnea and Continuous Positive Airway Pressure Ventilation , former smoker,    breath sounds clear to auscultation       Cardiovascular hypertension, Pt. on medications and Pt. on home beta blockers + CAD, + Cardiac Stents and + Peripheral Vascular Disease   Rhythm:Regular Rate:Normal     Neuro/Psych negative neurological ROS     GI/Hepatic negative GI ROS, Neg liver ROS,   Endo/Other  diabetes, Type 2, Oral Hypoglycemic AgentsMorbid obesity  Renal/GU negative Renal ROS     Musculoskeletal negative musculoskeletal ROS (+)   Abdominal   Peds  Hematology negative hematology ROS (+)   Anesthesia Other Findings   Reproductive/Obstetrics                            Lab Results  Component Value Date   WBC 11.1* 05/08/2015   HGB 14.5 05/08/2015   HCT 44.2 05/08/2015   MCV 94.4 05/08/2015   PLT 164 05/08/2015   Lab Results  Component Value Date   CREATININE 0.72 05/11/2015   BUN 19 05/11/2015   NA 134* 05/11/2015   K 4.6 05/11/2015   CL 99* 05/11/2015   CO2 27 05/11/2015    Anesthesia Physical Anesthesia Plan  ASA: III  Anesthesia Plan: General   Post-op Pain Management:    Induction: Intravenous  Airway Management Planned: Oral ETT  Additional Equipment:   Intra-op Plan:   Post-operative Plan: Extubation in OR  Informed Consent: I have reviewed the patients History and Physical, chart, labs and discussed the procedure including the risks, benefits and alternatives for the proposed anesthesia with the patient or  authorized representative who has indicated his/her understanding and acceptance.   Dental advisory given  Plan Discussed with: CRNA  Anesthesia Plan Comments:         Anesthesia Quick Evaluation

## 2015-05-11 NOTE — Progress Notes (Signed)
Inpatient Diabetes Program Recommendations  AACE/ADA: New Consensus Statement on Inpatient Glycemic Control (2015)  Target Ranges:  Prepandial:   less than 140 mg/dL      Peak postprandial:   less than 180 mg/dL (1-2 hours)      Critically ill patients:  140 - 180 mg/dL   Review of Glycemic Control  Diabetes history: DM 2 Outpatient Diabetes medications: Metformin 500 mg Daily Current orders for Inpatient glycemic control: Novolog Moderate TID  Inpatient Diabetes Program Recommendations: Correction (SSI): Patient on IV decadron 4mg  Q8hrs. Patient occassionally in the 200's. May want to consider increasing correction to Resistant and add HS scale since HS glucose was 272 mg/dl last pm.    Thanks,  Tama Headings RN, MSN, [facility] Inpatient Diabetes Coordinator Team Pager [phone] (8a-5p)

## 2015-05-11 NOTE — Progress Notes (Signed)
Patient ID: REIN POPOV, male   DOB: 1946-06-29, 69 y.o.   MRN: [id number] I have reviewed the events of this patient's hospitalization. I've seen him about 2 weeks ago in the office for progressive difficulty with his walking and suggested the need for an MRI. He was not able to lie comfortably for the MRI and I then suggested that he would need a myelogram and post myelogram CAT scan. In the meantime he had decompensated clinically to the point where he was having difficulty with any mobilization and pain was intolerable. I appreciate the help of Dr. Vertell Limber and particularly the hospitalist service in admitting this patient stabilizing him medically and obtaining a reasonable study.  Review of his MRI demonstrates that he has what appears to be an extruded disc at L3-L4 in addition to worsening spondylolisthesis and stenosis at both L3-4 and L4-5. Though the study is an incomplete scan there is enough information there to advise surgery on the basis of the pathology that is present.  I have discussed with Mr. Casanova doing a two-level decompression and stabilization from L3-L5. It is also appreciated on the plain x-rays that he is developing a degenerative scoliosis. We will be able to stabilize this across the apex of the curve which is just starting to form at this time.  The patient is ready to proceed with surgery. He will be nothing by mouth as of now. We'll plan on doing the surgery this afternoon.

## 2015-05-11 NOTE — Transfer of Care (Signed)
Immediate Anesthesia Transfer of Care Note  Patient: Kenneth Lee  Procedure(s) Performed: Procedure(s): Lumbar three-four, four-five decompression, posterior lumbar interbody arthrodesis, Lumbar three-four, four-five pedicle screws (Bilateral)  Patient Location: PACU  Anesthesia Type:General  Level of Consciousness: awake, alert  and oriented  Airway & Oxygen Therapy: Patient Spontanous Breathing and Patient connected to face mask oxygen  Post-op Assessment: Report given to RN and Post -op Vital signs reviewed and stable  Post vital signs: Reviewed and stable  Last Vitals:  Filed Vitals:   05/11/15 2319  BP: 157/62  Pulse: 60  Temp: 36.5 C  Resp: 25    Complications: No apparent anesthesia complications

## 2015-05-11 NOTE — Care Management Important Message (Signed)
Important Message  Patient Details  Name: Kenneth Lee MRN: [id number] Date of Birth: February 21, 1946   Medicare Important Message Given:  Yes-second notification given    [name] May 05/11/2015, 5:38 PM

## 2015-05-11 NOTE — Op Note (Signed)
Date of surgery: 05/11/2015 Preoperative diagnosis: Spondylolisthesis and stenosis L3-4 and L4-5 with herniated nucleus pulposus L3-4. Severe radiculopathy. Neurogenic claudication. Postoperative diagnosis: Spondylolisthesis and stenosis L3-4 and L4-5 with herniated nucleus pulposus L3-4 severe radiculopathy, neurogenic claudication. Procedure: Laminotomies bilaterally at L3-4 and L4-5 with complete discectomy L3-4 and L4-5 decompression of the L3-L4 and L5 nerve roots individually with more work than require for simple interbody technique posterior lumbar interbody arthrodesis using peek spacers local autograft and allograft pedicle screw fixation L3-L4 and L5 with posterior lateral arthrodesis L3-L5  Surgeon: Kristeen Miss First assistant: Deri Fuelling M.D. Anesthesia: Gen. endotracheal Indications: Kenneth Lee is a 69 year old individual who's had aggressive anterior and the level of function with his back and his legs most recently causing such severe pain that he could not ambulate and the pain was intractable despite strong narcotic pain medication. He is admitted to the hospital emergently this past Friday. After stabilization of his pain he was able to undergo an MRI of lumbar spine though this was somewhat poor quality study did demonstrate that there was severe stenosis at L3-4 L4-5 with a disc herniation at L3-L4. Been advised regarding the need for surgery.  Procedure: The patient was brought to the operating room supine on a stretcher. After the smooth induction of general endotracheal anesthesia, he was turned prone and the bony prominences were appropriately padded and protected. An elliptical incision was made around his previous scar in the lower lumbar spine and the dissection was carried down to the lumbar dorsal fascia. The fascia was opened on either side of midline in the first spinous process was noted to be that of L4. Then by localizing L3-4 and L4-5-1 subperiosteal dissection  was performed in each of these regions to expose the spinous processes and the laminar arches of L3-L4 and L5. The dissection was carried out over the facet joints. Self-retaining retractors were placed in the wound. At this point it was noted that there was very little motion in the patient's spine and therefore carefully laminotomies were created first at L3-L4 bilaterally and then at L4-L5 bilaterally. This identified the path of the common dural tube at L3-4 particularly on the left side was noted be some substantial elevation of the common dural tube on further dissection superiorly behind the vertebral body of L3 is found several fragments of disc. These were removed in a piecemeal fashion and this space itself was noted then to be rather soft and bulging. The disc space was incised with a 15 blade and from the right side than the left side a total discectomy was performed. Interspace was spread with a disc space spreader to allow further cleaning of the interspace both medially and laterally once this was accomplished the interspace was sized for appropriate size spacer and is felt that 8 8 mm tall 20 mm long spacer would fit best into this interspace and this was packed into the interspace along with 9 mL of the patient's autologous bone mixed with allograft. Once this fusion was completed then laminotomy sites were created at L4-L5 and the common dural tube and the L5 nerve root inferiorly the L4 nerve root superiorly were decompressed and her travel at the foramen. On the right sided been previous evidence of a laminotomy and decompression and the dissection here was slow because there was substantial adherence of the dura to the surrounding tissues at one point there is a small dural tear this was closed with a singular horizontal mattress of 60 proline suture. Minimal  spinal fluid was lost through this dural tear. Procedure continued in the disc space at L4-L5 was noted to be severely sclerotic however  was entered and discectomy was performed dissectors were used then to spread the interspace and ultimately this could be raised to an 8 mm height and 8 mm tall 23 mm long 4 lordotic spacer was then packed and placed into this interspace along with a total of 6 mL of the patient's own bone. Once this was accomplished the dissection over the facet joints was undertaken to expose the lateral gutters. Fluoroscopic guidance was then used to place pedicle screws in L3 L4 L5 area these were 6.5 x 45 mm screws each placed individually by viewing using fluoroscopic guidance sounding a hole tapping it then placing the screw each hole was checked for any cutout none was noted. Then a 70 mm precontoured rod was used to connect the screws together. Once this was accomplished and the screw heads were mounted the system was torqued in a mutual construct. The lateral gutters which had previously then been decorticated were packed with her patient's remainder of the autologous bone graft and allograft mixture. Once this was accomplished hemostasis in the surgical bed was achieved meticulously the nerve roots were checked to make sure that there were well decompressed as was the common dural tube and then the patient's lumbar dorsal fascia was closed with #1 Vicryls in interrupted fashion 2 overt was used in subcutaneous tissues and 30 Vicryls subcuticularly. Patient tolerated procedure well was returned to recovery room stable condition.

## 2015-05-12 DIAGNOSIS — G4733 Obstructive sleep apnea (adult) (pediatric): Secondary | ICD-10-CM

## 2015-05-12 DIAGNOSIS — E119 Type 2 diabetes mellitus without complications: Secondary | ICD-10-CM

## 2015-05-12 DIAGNOSIS — I25118 Atherosclerotic heart disease of native coronary artery with other forms of angina pectoris: Secondary | ICD-10-CM

## 2015-05-12 LAB — GLUCOSE, CAPILLARY
Glucose-Capillary: 157 mg/dL — ABNORMAL HIGH (ref 65–99)
Glucose-Capillary: 175 mg/dL — ABNORMAL HIGH (ref 65–99)
Glucose-Capillary: 183 mg/dL — ABNORMAL HIGH (ref 65–99)
Glucose-Capillary: 188 mg/dL — ABNORMAL HIGH (ref 65–99)
Glucose-Capillary: 189 mg/dL — ABNORMAL HIGH (ref 65–99)

## 2015-05-12 LAB — BASIC METABOLIC PANEL WITH GFR
Anion gap: 7 (ref 5–15)
BUN: 22 mg/dL — ABNORMAL HIGH (ref 6–20)
CO2: 29 mmol/L (ref 22–32)
Calcium: 9 mg/dL (ref 8.9–10.3)
Chloride: 98 mmol/L — ABNORMAL LOW (ref 101–111)
Creatinine, Ser: 0.93 mg/dL (ref 0.61–1.24)
GFR calc Af Amer: >60 mL/min (ref >60)
GFR calc non Af Amer: >60 mL/min (ref >60)
Glucose, Bld: 198 mg/dL — ABNORMAL HIGH (ref 65–99)
Potassium: 5.3 mmol/L — ABNORMAL HIGH (ref 3.5–5.1)
Sodium: 134 mmol/L — ABNORMAL LOW (ref 135–145)

## 2015-05-12 LAB — CBC
HCT: 38.1 % — ABNORMAL LOW (ref 39.0–52.0)
Hemoglobin: 12.6 g/dL — ABNORMAL LOW (ref 13.0–17.0)
MCH: 31.6 pg (ref 26.0–34.0)
MCHC: 33.1 g/dL (ref 30.0–36.0)
MCV: 95.5 fL (ref 78.0–100.0)
Platelets: 175 10*3/uL (ref 150–400)
RBC: 3.99 MIL/uL — ABNORMAL LOW (ref 4.22–5.81)
RDW: 12.8 % (ref 11.5–15.5)
WBC: 19.5 10*3/uL — ABNORMAL HIGH (ref 4.0–10.5)

## 2015-05-12 LAB — CULTURE, BLOOD (ROUTINE X 2)
Culture: NO GROWTH
Culture: NO GROWTH

## 2015-05-12 MED ORDER — METHOCARBAMOL 1000 MG/10ML IJ SOLN
500.0000 mg | Freq: Four times a day (QID) | INTRAVENOUS | Status: DC | PRN
  Filled 2015-05-12: qty 5

## 2015-05-12 MED ORDER — ACETAMINOPHEN 650 MG RE SUPP: 650.0000 mg | RECTAL | Status: DC | PRN

## 2015-05-12 MED ORDER — VANCOMYCIN HCL 10 G IV SOLR
1500.0000 mg | Freq: Once | INTRAVENOUS | Status: AC
  Administered 2015-05-12: 1500 mg via INTRAVENOUS
  Filled 2015-05-12: qty 1500

## 2015-05-12 MED ORDER — SODIUM CHLORIDE 0.9 % IV SOLN: 250.0000 mL | INTRAVENOUS | Status: DC

## 2015-05-12 MED ORDER — ACETAMINOPHEN 325 MG PO TABS: 650.0000 mg | ORAL_TABLET | ORAL | Status: DC | PRN

## 2015-05-12 MED ORDER — HYDROCODONE-ACETAMINOPHEN 5-325 MG PO TABS: 1.0000 | ORAL_TABLET | ORAL | Status: DC | PRN

## 2015-05-12 MED ORDER — BISACODYL 10 MG RE SUPP: 10.0000 mg | Freq: Every day | RECTAL | Status: DC | PRN

## 2015-05-12 MED ORDER — MENTHOL 3 MG MT LOZG: 1.0000 | LOZENGE | OROMUCOSAL | Status: DC | PRN

## 2015-05-12 MED ORDER — ALUM & MAG HYDROXIDE-SIMETH 200-200-20 MG/5ML PO SUSP: 30.0000 mL | Freq: Four times a day (QID) | ORAL | Status: DC | PRN

## 2015-05-12 MED ORDER — HYDROMORPHONE HCL 1 MG/ML IJ SOLN
0.5000 mg | INTRAMUSCULAR | Status: DC | PRN
  Administered 2015-05-12: 1 mg via INTRAVENOUS
  Filled 2015-05-12: qty 1

## 2015-05-12 MED ORDER — OXYCODONE-ACETAMINOPHEN 5-325 MG PO TABS
1.0000 | ORAL_TABLET | ORAL | Status: DC | PRN
  Administered 2015-05-12 – 2015-05-14 (×10): 2 via ORAL
  Filled 2015-05-12 (×10): qty 2

## 2015-05-12 MED ORDER — SODIUM POLYSTYRENE SULFONATE 15 GM/60ML PO SUSP
30.0000 g | Freq: Once | ORAL | Status: AC
  Administered 2015-05-12: 30 g via ORAL
  Filled 2015-05-12: qty 120

## 2015-05-12 MED ORDER — ONDANSETRON HCL 4 MG/2ML IJ SOLN: 4.0000 mg | INTRAMUSCULAR | Status: DC | PRN

## 2015-05-12 MED ORDER — METHOCARBAMOL 500 MG PO TABS
500.0000 mg | ORAL_TABLET | Freq: Four times a day (QID) | ORAL | Status: DC | PRN
  Administered 2015-05-12: 500 mg via ORAL
  Filled 2015-05-12: qty 1

## 2015-05-12 MED ORDER — SODIUM CHLORIDE 0.9 % IJ SOLN: 3.0000 mL | INTRAMUSCULAR | Status: DC | PRN

## 2015-05-12 MED ORDER — SENNA 8.6 MG PO TABS
1.0000 | ORAL_TABLET | Freq: Two times a day (BID) | ORAL | Status: DC
  Administered 2015-05-12 – 2015-05-14 (×6): 8.6 mg via ORAL
  Filled 2015-05-12 (×6): qty 1

## 2015-05-12 MED ORDER — FUROSEMIDE 40 MG PO TABS
40.0000 mg | ORAL_TABLET | Freq: Every day | ORAL | Status: DC
  Administered 2015-05-13 – 2015-05-14 (×2): 40 mg via ORAL
  Filled 2015-05-12 (×2): qty 1

## 2015-05-12 MED ORDER — POLYETHYLENE GLYCOL 3350 17 G PO PACK: 17.0000 g | PACK | Freq: Every day | ORAL | Status: DC | PRN

## 2015-05-12 MED ORDER — FLEET ENEMA 7-19 GM/118ML RE ENEM: 1.0000 | ENEMA | Freq: Once | RECTAL | Status: DC | PRN

## 2015-05-12 MED ORDER — SODIUM CHLORIDE 0.9 % IJ SOLN
3.0000 mL | Freq: Two times a day (BID) | INTRAMUSCULAR | Status: DC
  Administered 2015-05-12: 3 mL via INTRAVENOUS

## 2015-05-12 MED ORDER — PHENOL 1.4 % MT LIQD: 1.0000 | OROMUCOSAL | Status: DC | PRN

## 2015-05-12 MED ORDER — LISINOPRIL 20 MG PO TABS
20.0000 mg | ORAL_TABLET | Freq: Every day | ORAL | Status: DC
  Administered 2015-05-13 – 2015-05-14 (×2): 20 mg via ORAL
  Filled 2015-05-12 (×2): qty 1

## 2015-05-12 MED ORDER — DOCUSATE SODIUM 100 MG PO CAPS
100.0000 mg | ORAL_CAPSULE | Freq: Two times a day (BID) | ORAL | Status: DC
  Administered 2015-05-12 – 2015-05-14 (×6): 100 mg via ORAL
  Filled 2015-05-12 (×6): qty 1

## 2015-05-12 MED FILL — Sodium Chloride IV Soln 0.9%: INTRAVENOUS | Qty: 2000 | Status: AC

## 2015-05-12 MED FILL — Heparin Sodium (Porcine) Inj 1000 Unit/ML: INTRAMUSCULAR | Qty: 30 | Status: AC

## 2015-05-12 NOTE — Progress Notes (Signed)
Patient Name: Kenneth Lee Date of Encounter: 05/12/2015  Primary Cardiologist: Dr. Tamala Julian   Principal Problem:   Back pain Active Problems:   CAD (coronary artery disease), native coronary artery   Essential hypertension   Hyperlipidemia with target LDL less than 70   Type 2 diabetes mellitus not at goal (Kenneth Lee)   OSA on CPAP   Acute on chronic diastolic heart failure (Kenneth Lee)    SUBJECTIVE  Did not sleep since 3AM as people keep going into his room. Denies any discomfort. Back pain has significantly improved. No SOB, states swelling started 5 days ago.   CURRENT MEDS . acetaminophen  1,000 mg Oral Q8H  . aspirin EC  325 mg Oral Daily  . docusate sodium  100 mg Oral BID  . enoxaparin (LOVENOX) injection  40 mg Subcutaneous Q24H  . furosemide  40 mg Intravenous Daily  . HYDROmorphone      . insulin aspart  0-15 Units Subcutaneous TID WC  . ketorolac  15 mg Intravenous 4 times per day  . [START ON 05/13/2015] lisinopril  20 mg Oral Daily  . magnesium hydroxide  15 mL Oral Once  . nebivolol  5 mg Oral Daily  . pantoprazole  40 mg Oral Q1200  . polyethylene glycol  17 g Oral BID  . rosuvastatin  10 mg Oral Daily  . senna  1 tablet Oral BID  . sodium chloride  3 mL Intravenous Q12H    OBJECTIVE  Filed Vitals:   05/12/15 0238 05/12/15 0500 05/12/15 0524 05/12/15 1038  BP: 121/55  134/55 170/66  Pulse: 50  50 54  Temp:   97.8 F (36.6 C) 98.1 F (36.7 C)  TempSrc:   Oral Oral  Resp:   20 20  Height:      Weight:  270 lb 11.6 oz (122.8 kg)    SpO2: 100%  99% 99%    Intake/Output Summary (Last 24 hours) at 05/12/15 1152 Last data filed at 05/12/15 1128  Gross per 24 hour  Intake 2469.17 ml  Output   1540 ml  Net 929.17 ml   Filed Weights   05/08/15 0000 05/12/15 0500  Weight: 280 lb 13.9 oz (127.4 kg) 270 lb 11.6 oz (122.8 kg)    PHYSICAL EXAM  General: Pleasant, NAD. Neuro: Alert and oriented X 3. Moves all extremities spontaneously. Psych: Normal  affect. HEENT:  Normal  Neck: Supple without bruits or JVD. Lungs:  Resp regular and unlabored, CTA. Heart: RRR no s3, s4, or murmurs. Abdomen: Soft, non-tender, non-distended, BS + x 4.  Extremities: No clubbing, cyanosis. DP/PT/Radials 2+ and equal bilaterally. 1+ pitting edema in the RLE, no edema in the LLE  Accessory Clinical Findings  CBC  Recent Labs  05/12/15 0540  WBC 19.5*  HGB 12.6*  HCT 38.1*  MCV 95.5  PLT 950   Basic Metabolic Panel  Recent Labs  05/11/15 0225 05/12/15 0540  NA 134* 134*  K 4.6 5.3*  CL 99* 98*  CO2 27 29  GLUCOSE 202* 198*  BUN 19 22*  CREATININE 0.72 0.93  CALCIUM 9.7 9.0    TELE NSR HR 40-50s     ECG  No new EKG  Echocardiogram 05/10/2015  LV EF: 55% -  60%  ------------------------------------------------------------------- Indications:   V728.1 Pre-op evaluation. Obesity, morbid 278.01.  ------------------------------------------------------------------- History:  PMH:  Coronary artery disease. Risk factors: Diabetes mellitus. Dyslipidemia.  ------------------------------------------------------------------- Study Conclusions  - Left ventricle: The cavity size was normal. Systolic function was  normal. The estimated ejection fraction was in the range of 55% to 60%. Wall motion was normal; there were no regional wall motion abnormalities. Features are consistent with a pseudonormal left ventricular filling pattern, with concomitant abnormal relaxation and increased filling pressure (grade 2 diastolic dysfunction). Doppler parameters are consistent with high ventricular filling pressure. - Aortic valve: Moderate focal calcification involving the noncoronary cusp. - Mitral valve: There was trivial regurgitation. - Atrial septum: There was increased thickness of the septum, consistent with lipomatous hypertrophy. Echo contrast study showed no right-to-left atrial level shunt, at  baseline or with provocation.     Radiology/Studies  Dg Lumbar Spine 2-3 Views  05/11/2015  CLINICAL DATA:  Lumbar decompression with posterior fusion EXAM: DG C-ARM 61-120 MIN; LUMBAR SPINE - 2-3 VIEW COMPARISON:  None. FLUOROSCOPY TIME:  Radiation Exposure Index (as provided by the fluoroscopic device): Not available If the device does not provide the exposure index: Fluoroscopy Time:  15 seconds Number of Acquired Images:  2 FINDINGS: Pedicle screws are noted at L3, L4 and L5 with posterior fixation. Interbody fusion is noted at L3-4 and L4-5. IMPRESSION: Lumbar decompression with posterior fusion Electronically Signed   By: Inez Catalina M.D.   On: 05/11/2015 22:58   Mr Lumbar Spine Wo Contrast  05/07/2015  CLINICAL DATA:  69 year old male with lumbar back pain, chronic but progressive x3 weeks. Severe pain, unable to have a outpatient lumbar MRI. Subsequent encounter. EXAM: MRI LUMBAR SPINE WITHOUT CONTRAST TECHNIQUE: Multiplanar, multisequence MR imaging of the lumbar spine was performed. No intravenous contrast was administered. COMPARISON:  [facility] lumbar radiographs 04/29/2015. [facility] 02/26/2012. [facility] lumbar MRI 07/14/2009. FINDINGS: The examination had to be discontinued prior to completion due to pain. Levoconvex lumbar scoliosis demonstrated on the recent radiographs. Normal lumbar segmentation, and this corresponds to the numbering system used on the 2011 comparison. Since 2011 anterolisthesis at L3-L4 and retrolisthesis at L4-L5 have progressed. Severe disc space loss at both levels has progressed. Trace vacuum disc anteriorly at L3-L4 on the recent radiographs. Progressed endplate spurring and sclerosis, maximal at L4-L5. On the sagittal STIR imaging today there is confluent STIR hyperintensity in the L3, L4, and L5 vertebral bodies mostly along the endplates. No suspicious increased T2 or STIR signal identified within the intervening  disc spaces. No definite prevertebral/paraspinal STIR hyperintensity. Visualized lower thoracic spinal cord is normal with conus medularis at L1-L2. 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 T12-L1:  Negative. L1-L2: Circumferential disc bulge. Borderline to mild spinal stenosis. L2-L3: Circumferential disc osteophyte complex and mild to moderate facet and ligament flavum hypertrophy. Mild to moderate spinal stenosis, not significantly changed since 2011. L3-L4: Bulky circumferential disc osteophyte complex and progressed severe facet hypertrophy. Severe spinal stenosis here appears increased since 2011. The sagittal images suggest a disc extrusion, sequestered fragment contributing to the appearance. See series 3, image 7 and series 8, image 20. The fragment measures up to 13 mm largest dimension. L4-L5: Severe disc space loss and circumferential disc osteophyte complex. Chronic moderate facet hypertrophy. Still, right lateral recess patency appears improved since 2011. Chronic moderate right greater than left foraminal stenosis appears stable and mostly related to endplate spurring. L5-S1: Left eccentric chronic circumferential disc osteophyte complex appears stable since 2011 along with moderate facet hypertrophy. Moderate left greater than right L5 foraminal stenosis appears stable. IMPRESSION: 1. Prematurely discontinued and motion degraded study despite maximal patient medication and multiple imaging attempts. 2. Severe spinal stenosis at L3-L4 appears progressed since 2011  and in part related to a sequestered disc fragment best seen on series 3, image 7. Grade 1 anterolisthesis also has progressed at this level along with severe facet hypertrophy. 3. Confluent marrow edema in the L3, L4, and L5 vertebral bodies is favored to be degenerative in nature owing to the progressed and now severe disc degeneration at these levels. Infection is felt less likely but in light of the patient's diabetes, blood cultures  and correlation with serologic markers of infection are recommended. Electronically Signed   By: Genevie Ann M.D.   On: 05/07/2015 19:52   Dg Lumbar Spine 1 View  05/11/2015  CLINICAL DATA:  L3-4 no L4-5 posterior laminectomy and fusion. EXAM: LUMBAR SPINE - 1 VIEW COMPARISON:  Previous examinations, including the lumbar spine MR dated 05/07/2015 and abdomen and pelvis CT dated 02/24/2010. FINDINGS: A portable cross-table lateral view of the lumbar spine is limited by motion blurring. Based on the labeling of the previous MR and in correlation with the previous CT, the last visible open disc space is labeled the L5-S1 level. There is stable mild retrolisthesis at the L4-5 level. A metallic localizer is demonstrated its tip overlying the L4 spinous process and a second metallic localizer is demonstrated with its tip overlying the space between the L3 and L4 spinous processes. IMPRESSION: Localizers, as described above. Electronically Signed   By: Claudie Revering M.D.   On: 05/11/2015 19:04   Dg C-arm 1-60 Min  05/11/2015  CLINICAL DATA:  Lumbar decompression with posterior fusion EXAM: DG C-ARM 61-120 MIN; LUMBAR SPINE - 2-3 VIEW COMPARISON:  None. FLUOROSCOPY TIME:  Radiation Exposure Index (as provided by the fluoroscopic device): Not available If the device does not provide the exposure index: Fluoroscopy Time:  15 seconds Number of Acquired Images:  2 FINDINGS: Pedicle screws are noted at L3, L4 and L5 with posterior fixation. Interbody fusion is noted at L3-4 and L4-5. IMPRESSION: Lumbar decompression with posterior fusion Electronically Signed   By: Inez Catalina M.D.   On: 05/11/2015 22:58    ASSESSMENT AND PLAN  5M with CAD s/p PCI RCA in 2446, chronic diastolic heart failure, hypertension, hyperlipidemia, DM, and OSA underwent spinal surgery on 05/11/2015 for severe L3-L4 radiculopathy, stenosis and spondylolisthesis. Cardiology consulted preop for preop clearance  1. Acute on chronic diastolic HF:  improved  - Echo 05/10/2015 EF 28-63%, grade 2 diastolic HF, no RWMA.  - still has 1+ pitting edema on RLE, but no edema on LLE, lung essentially CTA. Weight dropped 10 lbs from 280 to 270. BUN to Cr ratio >20. Will continue compression stocking. Likely can transition to PO lasix tomorrow, will order 40mg  daily.   2. CAD s/p PCI: stable.   3. Severe L3-L4 radiculopathy, stenosis and spondylolisthesis s/p spinal surgery 05/11/2015  4. Bradycardia: asymptomatic. On 5mg  Bystolic, may need to cut back.   5. OSA on CPAP  Signed, Almyra Deforest PA-C Pager: [phone]

## 2015-05-12 NOTE — Care Management Note (Addendum)
Case Management Note  Patient Details  Name: Kenneth Lee MRN: [id number] Date of Birth: 05-29-1946  Subjective/Objective:                    Action/Plan: Patient admitted with back pain and went to the OR on 05/11/15. Patient lives at home alone. PT recommending HHPT and DME. CM will continue to follow for discharge needs.   Expected Discharge Date:                  Expected Discharge Plan:  [facility]  In-House Referral:     Discharge planning Services     Post Acute Care Choice:    Choice offered to:     DME Arranged:    DME Agency:     HH Arranged:    [place] Agency:     Status of Service:  In process, will continue to follow  Medicare Important Message Given:  Yes-second notification given Date Medicare IM Given:    Medicare IM give by:    Date Additional Medicare IM Given:    Additional Medicare Important Message give by:     If discussed at [place] of Stay Meetings, dates discussed:    Additional Comments:  Pollie Friar, RN 05/12/2015, 3:59 PM

## 2015-05-12 NOTE — Evaluation (Signed)
Physical Therapy Evaluation Patient Details Name: Kenneth Lee MRN: [id number] DOB: 10-29-1945 Today's Date: 05/12/2015   History of Present Illness  Pt is a 69 y.o. male admitted for severe back pain. MRI reveals herniated disc at L3-4.05/11/15 L3-5 fusion  Clinical Impression  Patient is s/p above surgery resulting in the deficits listed below (see PT Problem List). Pt with limited mobility (due to pain) for many days prior to surgery. Patient will benefit from skilled PT to increase their independence and safety with mobility (while adhering to their precautions) to allow discharge to the venue listed below.     Follow Up Recommendations Home health PT;Supervision for mobility/OOB    Equipment Recommendations  Rolling walker with 5" wheels;3in1 (PT) (pt thinks he'll need hospital bed-can't tolerate lying flat)    Recommendations for Other Services       Precautions / Restrictions Precautions Precautions: Back Precaution Booklet Issued: No Precaution Comments: Pt educated on back precautions (could not recall any from prior education) Required Braces or Orthoses: Spinal Brace Spinal Brace: Lumbar corset;Applied in sitting position      Mobility  Bed Mobility Overal bed mobility: Needs Assistance Bed Mobility: Rolling;Sidelying to Sit Rolling: Supervision Sidelying to sit: Supervision;HOB elevated       General bed mobility comments: with rail; pt could not tolerate lowering HOB <30; vc for technique to maintain back precautions  Transfers Overall transfer level: Needs assistance Equipment used: Rolling walker (2 wheeled) Transfers: Sit to/from Stand Sit to Stand: Min assist;From elevated surface (simulate bed height at home)         General transfer comment: vc for safe use of RW  Ambulation/Gait Ambulation/Gait assistance: Min guard Ambulation Distance (Feet): 35 Feet Assistive device: Rolling walker (2 wheeled) Gait Pattern/deviations: Step-through  pattern;Decreased stride length Gait velocity: decreased   General Gait Details: becoming light-headed and limited ambulation  Stairs            Wheelchair Mobility    Modified Rankin (Stroke Patients Only)       Balance                                             Pertinent Vitals/Pain Pain Assessment: 0-10 Pain Score: 4  Pain Location: back Pain Descriptors / Indicators: Aching;Guarding Pain Intervention(s): Limited activity within patient's tolerance;Monitored during session;Premedicated before session;Repositioned    Home Living Family/patient expects to be discharged to:: Private residence Living Arrangements: Alone Available Help at Discharge: Family;Available 24 hours/day (daughter to stay with him) Type of Home: House Home Access: Stairs to enter Entrance Stairs-Rails: Psychiatric nurse of Steps: 6 Home Layout: One level Home Equipment: Cane - single point      Prior Function Level of Independence: Independent               Hand Dominance        Extremity/Trunk Assessment   Upper Extremity Assessment: Overall WFL for tasks assessed           Lower Extremity Assessment: Overall WFL for tasks assessed      Cervical / Trunk Assessment: Other exceptions  Communication   Communication: No difficulties  Cognition Arousal/Alertness: Awake/alert Behavior During Therapy: WFL for tasks assessed/performed Overall Cognitive Status: Within Functional Limits for tasks assessed                      General  Comments General comments (skin integrity, edema, etc.): Daughter present at end of session    Exercises        Assessment/Plan    PT Assessment Patient needs continued PT services  PT Diagnosis Difficulty walking;Acute pain   PT Problem List Decreased activity tolerance;Decreased balance;Decreased mobility;Decreased knowledge of use of DME;Decreased knowledge of precautions;Obesity;Pain  PT  Treatment Interventions DME instruction;Gait training;Stair training;Functional mobility training;Therapeutic activities;Patient/family education   PT Goals (Current goals can be found in the Care Plan section) Acute Rehab PT Goals Patient Stated Goal: learn to safely mobilize PT Goal Formulation: With patient Time For Goal Achievement: 05/18/15 Potential to Achieve Goals: Good    Frequency Min 5X/week   Barriers to discharge        Co-evaluation               End of Session Equipment Utilized During Treatment: Back brace Activity Tolerance: Patient tolerated treatment well Patient left: in chair;with call bell/phone within reach;with chair alarm set;with family/visitor present Nurse Communication: Mobility status         Time: 3646-8032 PT Time Calculation (min) (ACUTE ONLY): 33 min   Charges:   PT Evaluation $Initial PT Evaluation Tier I: 1 Procedure PT Treatments $Gait Training: 8-22 mins   PT G Codes:        [address] May 16, 2015, 12:06 PM Pager [phone]

## 2015-05-12 NOTE — Progress Notes (Signed)
Ortho Tech paged regarding patient's brace order. Will call RN back. Patient request to have foley remove once brace is available. Report to oncoming [employer].   Ave Filter, RN

## 2015-05-12 NOTE — Clinical Social Work Note (Signed)
CSW Consult Acknowledged:   CSW received a consult for SNF placement. Per PT/OT's evaluation the appropriate level of care is home. CSW will sign off.      Kenneth Lee, MSW, LCSWA [phone]  

## 2015-05-12 NOTE — Progress Notes (Signed)
PATIENT DETAILS Name: Kenneth Lee Age: 69 y.o. Sex: male Date of Birth: 11-03-1945 Admit Date: 05/07/2015 Admitting Physician Lavina Hamman, MD [facility]:XNATFT,DDUKGU D, MD  Brief Narrative:   69 y.o. male with PMHx of CAD s/p PCI with BMS in 2008, chronic back pain from spinal stenosis admitted with worsening back pain. MRI LS Spine shows severe spinal stenosis with last disc herniation at L2-L3. Seen by neurosurgery, plans are for surgery early this week. Cardiology consulted for preoperative evaluation, echocardiogram demonstrates preserved ejection fraction. Back pain significantly improved with IV steroids.   Subjective: Sitting in chair, denies any headache, no chest or abdominal pain, no shortness of breath. Continues to have low back pain. No focal weakness.  Assessment/Plan:  Intractable back pain: Secondary to severe spinal stenosis and a large disc herniation at L3-L4 level. Although some concern for possible discitis on MRI-clinically no suggestion of discitis. CRP and ESR only minimally elevated, afebrile with negative blood cultures. Underwent L-spine discectomy as below by [name] on 05/11/2015, to wear lumbar brace when out of bed, await PT evaluation. Continue supportive care and likely discharge in the morning.    Acute on chronic diastolic heart failure EF 55-60%: Likely causing worsening lower extremity edema, improved with IV Lasix. His salt and fluid restriction, lower extremity venous duplex negative for DVT.    Hyponatremia: Suspect secondary to CHF-continue Lasix, sodium stable. Since hyponatremia mild-doubt any need for further workup.  History of CAD-status post bare metal stent 2008: Continue aspirin, statin and beta blocker. Without chest pain or shortness of breath-but very sedentary lifestyle for the past few months/years given slowly progressively worsening back pain. Reduced Bystolic dose as resting heart rate in mid  40s.  Hypertension: Moderately controlled- dicontinued amlodipine has could be contributing to fluid retention or edema, reduced Bystolic dose continue lisinopril and monitor. If needed will add hydralazine.  Dyslipidemia: Continue with Crestor  Obesity: Counseled regarding importance of weight loss  OSA on CPAP: OSA likely secondary to obesity  History of peripheral vascular disease status post aortofemoral bypass.  Incidental enlarged lymph nodes in the right leg. Outpatient follow-up with PCP.  Hyperkalemia. Kayexalate, repeat potassium in the morning. Continue Lasix.    Type 2 diabetes: Continue to hold metformin-CBGs stable with SSI. Monitor closely as on steroids.  No results found for: HGBA1C  CBG (last 3)   Recent Labs  05/11/15 2328 05/12/15 0036 05/12/15 0641  GLUCAP 145* 157* 175*      Disposition: Remain inpatient-suspect will require several more days of hospitalization and back surgery prior to discharge  Antimicrobial agents  See below  Anti-infectives    Start     Dose/Rate Route Frequency Ordered Stop   05/12/15 0600  vancomycin (VANCOCIN) 1,500 mg in sodium chloride 0.9 % 500 mL IVPB     1,500 mg 250 mL/hr over 120 Minutes Intravenous  Once 05/12/15 0000 05/12/15 0751   05/11/15 1857  bacitracin 50,000 Units in sodium chloride irrigation 0.9 % 500 mL irrigation  Status:  Discontinued       As needed 05/11/15 1857 05/11/15 2317   05/11/15 1822  vancomycin (VANCOCIN) 1 GM/200ML IVPB    Comments:  Trixie Deis   : cabinet override      05/11/15 1822 05/11/15 1906      DVT Prophylaxis: Prophylactic Lovenox   Code Status: Full code   Family Communication Friend at bedside  Procedures:  L-spine surgery by [name] on 05/11/2015 - Laminotomies bilaterally at L3-4 and L4-5 with complete discectomy L3-4 and L4-5 decompression of the L3-L4 and L5 nerve roots individually   TTE  - Left ventricle: The cavity size was normal. Systolic function  wasnormal. The estimated ejection fraction was in the range of 55%to 60%. Wall motion was normal; there were no regional wallmotion abnormalities. Features are consistent with a pseudonormalleft ventricular filling pattern, with concomitant abnormalrelaxation and increased filling pressure (grade 2 diastolicdysfunction). Doppler parameters are consistent with highventricular filling pressure. - Aortic valve: Moderate focal calcification involving thenoncoronary cusp. - Mitral valve: There was trivial regurgitation. - Atrial septum: There was increased thickness of the septum,consistent with lipomatous hypertrophy. Echo contrast studyshowed no right-to-left atrial level shunt, at baseline or withprovocation.   Leg [place]  No evidence of deep vein thrombosis involving the visualized veins of the right lower extremity and left lower extremity. Incidental findings are consistent with: enlarged lymph node on the right. - No evidence of Baker&'s cyst on the right or left.   CONSULTS:  cardiology and Neurosurgery  Time spent 20 minutes-Greater than 50% of this time was spent in counseling, explanation of diagnosis, planning of further management, and coordination of care.  MEDICATIONS: Scheduled Meds: . acetaminophen  1,000 mg Oral Q8H  . aspirin EC  325 mg Oral Daily  . docusate sodium  100 mg Oral BID  . enoxaparin (LOVENOX) injection  40 mg Subcutaneous Q24H  . furosemide  40 mg Intravenous Daily  . HYDROmorphone      . HYDROmorphone      . insulin aspart  0-15 Units Subcutaneous TID WC  . ketorolac  15 mg Intravenous 4 times per day  . [START ON 05/13/2015] lisinopril  20 mg Oral Daily  . magnesium hydroxide  15 mL Oral Once  . nebivolol  5 mg Oral Daily  . pantoprazole  40 mg Oral Q1200  . polyethylene glycol  17 g Oral BID  . rosuvastatin  10 mg Oral Daily  . senna  1 tablet Oral BID  . sodium chloride  3 mL Intravenous Q12H   Continuous Infusions:   PRN Meds:.alum & mag  hydroxide-simeth, bisacodyl, cyclobenzaprine, diazepam, HYDROcodone-acetaminophen, HYDROmorphone (DILAUDID) injection, [DISCONTINUED] ondansetron **OR** ondansetron (ZOFRAN) IV, oxyCODONE-acetaminophen, sodium phosphate    PHYSICAL EXAM: Vital signs in last 24 hours: Filed Vitals:   05/12/15 0220 05/12/15 0238 05/12/15 0500 05/12/15 0524  BP:  121/55  134/55  Pulse:  50  50  Temp:    97.8 F (36.6 C)  TempSrc:    Oral  Resp: 18   20  Height:      Weight:   122.8 kg (270 lb 11.6 oz)   SpO2:  100%  99%    Weight change:  Filed Weights   05/08/15 0000 05/12/15 0500  Weight: 127.4 kg (280 lb 13.9 oz) 122.8 kg (270 lb 11.6 oz)   Body mass index is 36.71 kg/(m^2).   Gen Exam: Awake and alert with clear speech.   Neck: Supple, No JVD.   Chest: B/L Clear.   CVS: S1 S2 Regular, no murmurs.  Abdomen: soft, BS +, non tender, non distended.  Extremities: + edema, lower extremities warm to touch-TED hose in place Neurologic: Nonfocal exam Skin: No Rash.   Wounds: N/A.   Intake/Output from previous day:  Intake/Output Summary (Last 24 hours) at 05/12/15 1125 Last data filed at 05/12/15 0830  Gross per 24 hour  Intake 2469.17 ml  Output  840 ml  Net 1629.17 ml     LAB RESULTS: CBC  Recent Labs Lab 05/07/15 2156 05/08/15 0630 05/12/15 0540  WBC 11.2* 11.1* 19.5*  HGB 15.3 14.5 12.6*  HCT 44.0 44.2 38.1*  PLT 170 164 175  MCV 92.2 94.4 95.5  MCH 32.1 31.0 31.6  MCHC 34.8 32.8 33.1  RDW 12.8 13.2 12.8  LYMPHSABS 2.4 2.6  --   MONOABS 1.0 0.7  --   EOSABS 0.1 0.1  --   BASOSABS 0.0 0.0  --     Chemistries   Recent Labs Lab 05/08/15 0630 05/09/15 0525 05/10/15 0640 05/11/15 0225 05/12/15 0540  NA 131* 129* 130* 134* 134*  K 4.5 4.8 4.8 4.6 5.3*  CL 95* 96* 95* 99* 98*  CO2 '27 25 28 27 29  ' GLUCOSE 129* 167* 143* 202* 198*  BUN '15 17 18 19 ' 22*  CREATININE 0.86 0.75 0.78 0.72 0.93  CALCIUM 9.6 9.2 9.4 9.7 9.0    CBG:  Recent Labs Lab  05/11/15 1147 05/11/15 1638 05/11/15 2328 05/12/15 0036 05/12/15 0641  GLUCAP 145* 130* 145* 157* 175*    GFR Estimated Creatinine Clearance: 100 mL/min (by C-G formula based on Cr of 0.93).  Coagulation profile  Recent Labs Lab 05/08/15 0017  INR 1.17    Cardiac Enzymes No results for input(s): CKMB, TROPONINI, MYOGLOBIN in the last 168 hours.  Invalid input(s): CK  Invalid input(s): POCBNP No results for input(s): DDIMER in the last 72 hours. No results for input(s): HGBA1C in the last 72 hours. No results for input(s): CHOL, HDL, LDLCALC, TRIG, CHOLHDL, LDLDIRECT in the last 72 hours. No results for input(s): TSH, T4TOTAL, T3FREE, THYROIDAB in the last 72 hours.  Invalid input(s): FREET3 No results for input(s): VITAMINB12, FOLATE, FERRITIN, TIBC, IRON, RETICCTPCT in the last 72 hours. No results for input(s): LIPASE, AMYLASE in the last 72 hours.  Urine Studies No results for input(s): UHGB, CRYS in the last 72 hours.  Invalid input(s): UACOL, UAPR, USPG, UPH, UTP, UGL, UKET, UBIL, UNIT, UROB, ULEU, UEPI, UWBC, URBC, UBAC, CAST, UCOM, BILUA  MICROBIOLOGY: Recent Results (from the past 240 hour(s))  Culture, blood (routine x 2)     Status: None (Preliminary result)   Collection Time: 05/07/15  9:50 PM  Result Value Ref Range Status   Specimen Description BLOOD RIGHT WRIST  Final   Special Requests BOTTLES DRAWN AEROBIC AND ANAEROBIC 5CC  Final   Culture NO GROWTH 4 DAYS  Final   Report Status PENDING  Incomplete  Culture, blood (routine x 2)     Status: None (Preliminary result)   Collection Time: 05/07/15  9:56 PM  Result Value Ref Range Status   Specimen Description BLOOD RIGHT HAND  Final   Special Requests BOTTLES DRAWN AEROBIC AND ANAEROBIC 5CC  Final   Culture NO GROWTH 4 DAYS  Final   Report Status PENDING  Incomplete    RADIOLOGY STUDIES/RESULTS: Dg Lumbar Spine 2-3 Views  05/11/2015  CLINICAL DATA:  Lumbar decompression with posterior fusion  EXAM: DG C-ARM 61-120 MIN; LUMBAR SPINE - 2-3 VIEW COMPARISON:  None. FLUOROSCOPY TIME:  Radiation Exposure Index (as provided by the fluoroscopic device): Not available If the device does not provide the exposure index: Fluoroscopy Time:  15 seconds Number of Acquired Images:  2 FINDINGS: Pedicle screws are noted at L3, L4 and L5 with posterior fixation. Interbody fusion is noted at L3-4 and L4-5. IMPRESSION: Lumbar decompression with posterior fusion Electronically Signed   By: Elta Guadeloupe  Lukens M.D.   On: 05/11/2015 22:58   Mr Lumbar Spine Wo Contrast  05/07/2015  CLINICAL DATA:  69 year old male with lumbar back pain, chronic but progressive x3 weeks. Severe pain, unable to have a outpatient lumbar MRI. Subsequent encounter. EXAM: MRI LUMBAR SPINE WITHOUT CONTRAST TECHNIQUE: Multiplanar, multisequence MR imaging of the lumbar spine was performed. No intravenous contrast was administered. COMPARISON:  [facility] lumbar radiographs 04/29/2015. [facility] 02/26/2012. [facility] lumbar MRI 07/14/2009. FINDINGS: The examination had to be discontinued prior to completion due to pain. Levoconvex lumbar scoliosis demonstrated on the recent radiographs. Normal lumbar segmentation, and this corresponds to the numbering system used on the 2011 comparison. Since 2011 anterolisthesis at L3-L4 and retrolisthesis at L4-L5 have progressed. Severe disc space loss at both levels has progressed. Trace vacuum disc anteriorly at L3-L4 on the recent radiographs. Progressed endplate spurring and sclerosis, maximal at L4-L5. On the sagittal STIR imaging today there is confluent STIR hyperintensity in the L3, L4, and L5 vertebral bodies mostly along the endplates. No suspicious increased T2 or STIR signal identified within the intervening disc spaces. No definite prevertebral/paraspinal STIR hyperintensity. Visualized lower thoracic spinal cord is normal with conus medularis at L1-L2. Evidence of  a chronic distal aortic bypass. T12-L1:  Negative. L1-L2: Circumferential disc bulge. Borderline to mild spinal stenosis. L2-L3: Circumferential disc osteophyte complex and mild to moderate facet and ligament flavum hypertrophy. Mild to moderate spinal stenosis, not significantly changed since 2011. L3-L4: Bulky circumferential disc osteophyte complex and progressed severe facet hypertrophy. Severe spinal stenosis here appears increased since 2011. The sagittal images suggest a disc extrusion, sequestered fragment contributing to the appearance. See series 3, image 7 and series 8, image 20. The fragment measures up to 13 mm largest dimension. L4-L5: Severe disc space loss and circumferential disc osteophyte complex. Chronic moderate facet hypertrophy. Still, right lateral recess patency appears improved since 2011. Chronic moderate right greater than left foraminal stenosis appears stable and mostly related to endplate spurring. L5-S1: Left eccentric chronic circumferential disc osteophyte complex appears stable since 2011 along with moderate facet hypertrophy. Moderate left greater than right L5 foraminal stenosis appears stable. IMPRESSION: 1. Prematurely discontinued and motion degraded study despite maximal patient medication and multiple imaging attempts. 2. Severe spinal stenosis at L3-L4 appears progressed since 2011 and in part related to a sequestered disc fragment best seen on series 3, image 7. Grade 1 anterolisthesis also has progressed at this level along with severe facet hypertrophy. 3. Confluent marrow edema in the L3, L4, and L5 vertebral bodies is favored to be degenerative in nature owing to the progressed and now severe disc degeneration at these levels. Infection is felt less likely but in light of the patient's diabetes, blood cultures and correlation with serologic markers of infection are recommended. Electronically Signed   By: Genevie Ann M.D.   On: 05/07/2015 19:52   Dg Lumbar Spine 1  View  05/11/2015  CLINICAL DATA:  L3-4 no L4-5 posterior laminectomy and fusion. EXAM: LUMBAR SPINE - 1 VIEW COMPARISON:  Previous examinations, including the lumbar spine MR dated 05/07/2015 and abdomen and pelvis CT dated 02/24/2010. FINDINGS: A portable cross-table lateral view of the lumbar spine is limited by motion blurring. Based on the labeling of the previous MR and in correlation with the previous CT, the last visible open disc space is labeled the L5-S1 level. There is stable mild retrolisthesis at the L4-5 level. A metallic localizer is demonstrated its tip overlying the L4 spinous  process and a second metallic localizer is demonstrated with its tip overlying the space between the L3 and L4 spinous processes. IMPRESSION: Localizers, as described above. Electronically Signed   By: Claudie Revering M.D.   On: 05/11/2015 19:04   Dg C-arm 1-60 Min  05/11/2015  CLINICAL DATA:  Lumbar decompression with posterior fusion EXAM: DG C-ARM 61-120 MIN; LUMBAR SPINE - 2-3 VIEW COMPARISON:  None. FLUOROSCOPY TIME:  Radiation Exposure Index (as provided by the fluoroscopic device): Not available If the device does not provide the exposure index: Fluoroscopy Time:  15 seconds Number of Acquired Images:  2 FINDINGS: Pedicle screws are noted at L3, L4 and L5 with posterior fixation. Interbody fusion is noted at L3-4 and L4-5. IMPRESSION: Lumbar decompression with posterior fusion Electronically Signed   By: Inez Catalina M.D.   On: 05/11/2015 22:58    Thurnell Lose, MD  [facility] Pager:[phone]  If 7PM-7AM, please contact night-coverage [email or web address] Password [email or web address] 05/12/2015, 11:25 AM   LOS: 4 days

## 2015-05-12 NOTE — Progress Notes (Signed)
Patient ID: Kenneth Lee, male   DOB: 1945-09-19, 69 y.o.   MRN: [id number] Vital signs are stable Patient is feeling much better His leg pain is completely gone Has soreness from surgery His incision is clean and dry We'll continue to monitor status Does not need to be on stairs anymore

## 2015-05-12 NOTE — Progress Notes (Signed)
Occupational Therapy Evaluation Patient Details Name: Kenneth Lee MRN: [id number] DOB: August 07, 1945 Today's Date: 05/12/2015    History of Present Illness Pt is a 69 y.o. male admitted for severe back pain. MRI reveals herniated disc at L3-4.05/11/15 L3-5 fusion   Clinical Impression   Pt admitted with the above diagnoses and presents with below problem list. Pt will benefit from continued acute OT to address the below listed deficits and maximize independence with BADLs prior to d/c home with family assisting. PTA pt was independent with ADLs. Pt is currently min guard for toilet transfers and functional mobility; min to mod A with :B ADLs and mod to max A with tub transfer. OT to continue to follow acutely.      Follow Up Recommendations  No OT follow up;Supervision - Intermittent;Other (comment) (OOB/mobility)    Equipment Recommendations  3 in 1 bedside comode    Recommendations for Other Services       Precautions / Restrictions Precautions Precautions: Back Precaution Booklet Issued: No Precaution Comments: able to state 3/3 back precautions Required Braces or Orthoses: Spinal Brace Spinal Brace: Lumbar corset;Applied in sitting position Restrictions Weight Bearing Restrictions: No      Mobility Bed Mobility Overal bed mobility: Needs Assistance Bed Mobility: Rolling;Sidelying to Sit Rolling: Supervision Sidelying to sit: Supervision;HOB elevated       General bed mobility comments: in recliner  Transfers Overall transfer level: Needs assistance Equipment used: Rolling walker (2 wheeled) Transfers: Sit to/from Stand Sit to Stand: Min guard         General transfer comment: cues for technique with rw    Balance Overall balance assessment: Needs assistance Sitting-balance support: No upper extremity supported;Feet supported Sitting balance-Leahy Scale: Good     Standing balance support: Bilateral upper extremity supported;During functional  activity Standing balance-Leahy Scale: Fair                              ADL Overall ADL's : Needs assistance/impaired Eating/Feeding: Set up;Sitting   Grooming: Set up;Min guard;Sitting;Standing   Upper Body Bathing: Set up;Sitting   Lower Body Bathing: Sit to/from stand;Minimal assistance   Upper Body Dressing : Set up;Sitting   Lower Body Dressing: Minimal assistance;Sit to/from stand   Toilet Transfer: Min guard;Ambulation;BSC;RW   Toileting- Clothing Manipulation and Hygiene: Minimal assistance;Sit to/from stand   Tub/ Banker: Moderate assistance;Tub transfer;Maximal assistance;Ambulation;Rolling walker;Shower seat   Functional mobility during ADLs: Min guard;Rolling walker General ADL Comments: Pt min guard with toilet transfers and functional mobility as detailed above. Min A for LB ADLs with pt unable to access feet in seated position. Discussed AE for LB ADLs and recommendation for something to sit down on to shower. Pt likely mod-max assist for tub transfer.      Vision     Perception     Praxis      Pertinent Vitals/Pain Pain Assessment: 0-10 Pain Score:  ("it's not bad at all" no numerical value given) Pain Location: incision site, tightness in BLE Pain Descriptors / Indicators: Aching;Tightness Pain Intervention(s): Monitored during session;Repositioned     Hand Dominance     Extremity/Trunk Assessment Upper Extremity Assessment Upper Extremity Assessment: Overall WFL for tasks assessed   Lower Extremity Assessment Lower Extremity Assessment: Defer to PT evaluation   Cervical / Trunk Assessment Cervical / Trunk Assessment: Other exceptions Cervical / Trunk Exceptions: good upright posture with pain/guarding noted   Communication Communication Communication: No difficulties   Cognition  Arousal/Alertness: Awake/alert Behavior During Therapy: WFL for tasks assessed/performed Overall Cognitive Status: Within Functional Limits  for tasks assessed                     General Comments       Exercises       Shoulder Instructions      Home Living Family/patient expects to be discharged to:: Private residence Living Arrangements: Alone Available Help at Discharge: Family;Available 24 hours/day (daughter to stay with him) Type of Home: House Home Access: Stairs to enter [employer] of Steps: 6 Entrance Stairs-Rails: Right;Left Home Layout: One level     Bathroom Shower/Tub: Teacher, early years/pre: Standard     Home Equipment: Cane - single point   Additional Comments: old house, narrow bathtub with doors and a bar overhead that is in the way at times per pt      Prior Functioning/Environment Level of Independence: Independent             OT Diagnosis: Acute pain   OT Problem List: Impaired balance (sitting and/or standing);Decreased knowledge of use of DME or AE;Decreased knowledge of precautions;Pain   OT Treatment/Interventions: Self-care/ADL training;DME and/or AE instruction;Therapeutic activities;Patient/family education;Balance training    OT Goals(Current goals can be found in the care plan section) Acute Rehab OT Goals Patient Stated Goal: learn to safely mobilize OT Goal Formulation: With patient Time For Goal Achievement: 05/19/15 Potential to Achieve Goals: Good ADL Goals Pt Will Perform Grooming: with modified independence;standing;sitting Pt Will Perform Lower Body Bathing: with modified independence;with adaptive equipment;sit to/from stand Pt Will Perform Lower Body Dressing: with modified independence;with adaptive equipment;sit to/from stand Pt Will Perform Toileting - Clothing Manipulation and hygiene: with modified independence;with adaptive equipment;sit to/from stand;sitting/lateral leans Pt Will Perform Tub/Shower Transfer: Tub transfer;with modified independence;ambulating;3 in 1;rolling walker  OT Frequency: Min 2X/week   Barriers to  D/C:            Co-evaluation              End of Session Equipment Utilized During Treatment: Rolling walker;Back brace  Activity Tolerance: Patient tolerated treatment well Patient left: in chair;with call bell/phone within reach;with chair alarm set   Time: 1655-3748 OT Time Calculation (min): 22 min Charges:  OT General Charges $OT Visit: 1 Procedure OT Evaluation $Initial OT Evaluation Tier I: 1 Procedure G-Codes:    Hortencia Pilar 01-Jun-2015, 1:47 PM

## 2015-05-12 NOTE — Anesthesia Postprocedure Evaluation (Signed)
Anesthesia Post Note  Patient: Kenneth Lee  Procedure(s) Performed: Procedure(s) (LRB): Lumbar three-four, four-five decompression, posterior lumbar interbody arthrodesis, Lumbar three-four, four-five pedicle screws (Bilateral)  Anesthesia type: general  Patient location: PACU  Post pain: Pain level controlled  Post assessment: Patient's Cardiovascular Status Stable  Last Vitals:  Filed Vitals:   05/12/15 1420  BP: 139/76  Pulse: 60  Temp: 37.1 C  Resp: 20    Post vital signs: Reviewed and stable  Level of consciousness: sedated  Complications: No apparent anesthesia complications

## 2015-05-12 NOTE — Progress Notes (Signed)
Orthopedic Tech Progress Note Patient Details:  Kenneth Lee 04-24-1946 [id number]  Patient ID: Kenneth Lee, male   DOB: 1946-03-28, 69 y.o.   MRN: [id number]   Kenneth Lee 05/12/2015, 9:57 AMCalled [employer] for Lumbar brace.

## 2015-05-12 NOTE — Clinical Documentation Improvement (Signed)
Neuro Surgery  (please document your query response in the progress notes and discharge summary, not on the query form itself.)  Please document if the "small dural tear" was:  - a complication of the procedure and should be coded as such  - not a complication and was incidental to the procedure itself.   Clinical Information: Dictated op note dated 05/11/15 -  "On the right sided been previous evidence of a laminotomy and decompression and the dissection here was slow because there was substantial adherence of the dura to the surrounding tissues at one point there is a small dural tear this was closed with a singular horizontal mattress of 60 proline suture".  Please exercise your independent, professional judgment when responding. A specific answer is not anticipated or expected.   Thank You,  Erling Conte  RN BSN CCDS [phone] Health Information Management [place]

## 2015-05-12 NOTE — Progress Notes (Signed)
ANTIBIOTIC CONSULT NOTE - INITIAL  Pharmacy Consult for vancomycin Indication: post-op prophylaxis  Allergies  Allergen Reactions  . Penicillins Swelling   Patient Measurements: Height: 6' (182.9 cm) Weight: 280 lb 13.9 oz (127.4 kg) IBW/kg (Calculated) : 77.6 Adjusted Body Weight:   Vital Signs: Temp: 97.7 F (36.5 C) (10/31 2319) Temp Source: Oral (10/31 1416) BP: 164/65 mmHg (10/31 2330) Pulse Rate: 58 (10/31 2345) Intake/Output from previous day: 10/31 0701 - 11/01 0700 In: 2229.2 [I.V.:2229.2] Out: 690 [Urine:390; Blood:300] Intake/Output from this shift: Total I/O In: 1750 [I.V.:1750] Out: 690 [Urine:390; Blood:300]  Labs:  Recent Labs  05/09/15 0525 05/10/15 0640 05/11/15 0225  CREATININE 0.75 0.78 0.72   Estimated Creatinine Clearance: 118.5 mL/min (by C-G formula based on Cr of 0.72). No results for input(s): VANCOTROUGH, VANCOPEAK, VANCORANDOM, GENTTROUGH, GENTPEAK, GENTRANDOM, TOBRATROUGH, TOBRAPEAK, TOBRARND, AMIKACINPEAK, AMIKACINTROU, AMIKACIN in the last 72 hours.   Microbiology: Recent Results (from the past 720 hour(s))  Culture, blood (routine x 2)     Status: None (Preliminary result)   Collection Time: 05/07/15  9:50 PM  Result Value Ref Range Status   Specimen Description BLOOD RIGHT WRIST  Final   Special Requests BOTTLES DRAWN AEROBIC AND ANAEROBIC 5CC  Final   Culture NO GROWTH 4 DAYS  Final   Report Status PENDING  Incomplete  Culture, blood (routine x 2)     Status: None (Preliminary result)   Collection Time: 05/07/15  9:56 PM  Result Value Ref Range Status   Specimen Description BLOOD RIGHT HAND  Final   Special Requests BOTTLES DRAWN AEROBIC AND ANAEROBIC 5CC  Final   Culture NO GROWTH 4 DAYS  Final   Report Status PENDING  Incomplete    Medical History: Past Medical History  Diagnosis Date  . Erectile dysfunction   . Hypertension   . Sleep apnea   . Hyperlipidemia     mixed hyperlidemia  . Goiter   . Other specified  cardiac dysrhythmias(427.89)   . Sinoatrial node dysfunction (HCC)   . Carotid disease, bilateral ([place])   . Coronary artery disease     with bare mental stent 2008 and RCA is Card   Dr.Smith  . History of aortoiliofemoral vascular bypass     Dr [name]  . Diabetes mellitus without complication ([place])     Medications:  Prescriptions prior to admission  Medication Sig Dispense Refill Last Dose  . amLODipine (NORVASC) 5 MG tablet Take 1 tablet (5 mg total) by mouth daily. 30 tablet 0 05/07/2015 at Unknown time  . aspirin EC 325 MG tablet Take 325 mg by mouth daily.    05/07/2015 at Unknown time  . BYSTOLIC 10 MG tablet TAKE 1 TABLET BY MOUTH EVERY DAY 30 tablet 11 05/07/2015 at 0800  . CRESTOR 10 MG tablet TAKE 1 TABLET BY MOUTH EVERY DAY 30 tablet 11 05/06/2015 at Unknown time  . diazepam (VALIUM) 10 MG tablet Take 10 mg by mouth every 6 (six) hours as needed for anxiety.   05/06/2015 at Unknown time  . furosemide (LASIX) 40 MG tablet Take 1 tablet (40 mg total) by mouth daily. 30 tablet 3 05/07/2015 at Unknown time  . HYDROcodone-acetaminophen (NORCO) 10-325 MG tablet Take 1 tablet by mouth every 6 (six) hours as needed for moderate pain.   05/07/2015 at Unknown time  . lisinopril (PRINIVIL,ZESTRIL) 20 MG tablet Take 1 tablet (20 mg total) by mouth daily. 90 tablet 3 05/07/2015 at Unknown time  . metFORMIN (GLUCOPHAGE) 500 MG tablet Take 500  mg by mouth daily.    05/07/2015 at Unknown time  . nitroGLYCERIN (NITROSTAT) 0.4 MG SL tablet Place 0.4 mg under the tongue every 5 (five) minutes as needed for chest pain.   unknown at unknown  . amLODipine (NORVASC) 5 MG tablet Take 1 tablet (5 mg total) by mouth daily. (Patient not taking: Reported on 05/07/2015) 90 tablet 0   . furosemide (LASIX) 40 MG tablet TAKE 1 TABLET BY MOUTH ONCE DAILY (Patient not taking: Reported on 05/07/2015) 30 tablet 11    Assessment: 69 yo male s/p discectomy and decompression. No drain. No current signs of  infection.  Goal of Therapy:  Vancomycin trough level 10-15 mcg/ml  Plan:  -Vancomycin 1500 mg IV x1 -Monitor for s/sx infection   Harvel Quale  05/12/2015 12:02 AM

## 2015-05-13 DIAGNOSIS — Z419 Encounter for procedure for purposes other than remedying health state, unspecified: Secondary | ICD-10-CM | POA: Insufficient documentation

## 2015-05-13 LAB — CBC
HCT: 32.9 % — ABNORMAL LOW (ref 39.0–52.0)
Hemoglobin: 11.3 g/dL — ABNORMAL LOW (ref 13.0–17.0)
MCH: 32.3 pg (ref 26.0–34.0)
MCHC: 34.3 g/dL (ref 30.0–36.0)
MCV: 94 fL (ref 78.0–100.0)
Platelets: 126 10*3/uL — ABNORMAL LOW (ref 150–400)
RBC: 3.5 MIL/uL — ABNORMAL LOW (ref 4.22–5.81)
RDW: 12.9 % (ref 11.5–15.5)
WBC: 11.4 10*3/uL — ABNORMAL HIGH (ref 4.0–10.5)

## 2015-05-13 LAB — BASIC METABOLIC PANEL
Anion gap: 9 (ref 5–15)
BUN: 26 mg/dL — ABNORMAL HIGH (ref 6–20)
CO2: 29 mmol/L (ref 22–32)
Calcium: 8.7 mg/dL — ABNORMAL LOW (ref 8.9–10.3)
Chloride: 97 mmol/L — ABNORMAL LOW (ref 101–111)
Creatinine, Ser: 0.89 mg/dL (ref 0.61–1.24)
GFR calc Af Amer: >60 mL/min (ref >60)
GFR calc non Af Amer: >60 mL/min (ref >60)
Glucose, Bld: 151 mg/dL — ABNORMAL HIGH (ref 65–99)
Potassium: 3.7 mmol/L (ref 3.5–5.1)
Sodium: 135 mmol/L (ref 135–145)

## 2015-05-13 LAB — GLUCOSE, CAPILLARY
Glucose-Capillary: 156 mg/dL — ABNORMAL HIGH (ref 65–99)
Glucose-Capillary: 126 mg/dL — ABNORMAL HIGH (ref 65–99)
Glucose-Capillary: 139 mg/dL — ABNORMAL HIGH (ref 65–99)
Glucose-Capillary: 133 mg/dL — ABNORMAL HIGH (ref 65–99)

## 2015-05-13 MED ORDER — HYDROCODONE-ACETAMINOPHEN 10-325 MG PO TABS: 1.0000 | ORAL_TABLET | Freq: Four times a day (QID) | ORAL | Status: DC | PRN

## 2015-05-13 MED ORDER — HYDRALAZINE HCL 25 MG PO TABS: 25.0000 mg | ORAL_TABLET | Freq: Three times a day (TID) | ORAL | Status: DC

## 2015-05-13 MED ORDER — NEBIVOLOL HCL 5 MG PO TABS: 5.0000 mg | ORAL_TABLET | Freq: Every day | ORAL | Status: DC

## 2015-05-13 NOTE — Discharge Summary (Addendum)
Kenneth Lee, is a 69 y.o. male  DOB [birth year]  MRN [id number].  Admission date:  05/07/2015  Admitting Physician  Lavina Hamman, MD  Discharge Date:  05/14/2015   Primary MD  Kandice Hams, MD  Recommendations for primary care physician for things to follow:   Check CBC, BMP next visit. Monitor blood pressure and heart rate closely.  He has incidental finding of enlarged lymph nodes on lower extremity venous duplex. Outpatient follow-up and workup by PCP.  Admission Diagnosis  Low back pain [M54.5]   Discharge Diagnosis  Low back pain [M54.5]    Principal Problem:   Back pain Active Problems:   CAD (coronary artery disease), native coronary artery   Essential hypertension   Hyperlipidemia with target LDL less than 70   Type 2 diabetes mellitus not at goal (HCC)   OSA on CPAP   Acute on chronic diastolic heart failure [facility])   Surgery, other elective      Past Medical History  Diagnosis Date  . Erectile dysfunction   . Hypertension   . Sleep apnea   . Hyperlipidemia     mixed hyperlidemia  . Goiter   . Other specified cardiac dysrhythmias(427.89)   . Sinoatrial node dysfunction (HCC)   . Carotid disease, bilateral ([place])   . Coronary artery disease     with bare mental stent 2008 and RCA is Card   Dr.Smith  . History of aortoiliofemoral vascular bypass     Dr [name]  . Diabetes mellitus without complication [facility])     Past Surgical History  Procedure Laterality Date  . Cholecystectomy    . Aortic bifemural bypass    . Discectomy         HPI  from the history and physical done on the day of admission:     69 y.o. male with PMHx of CAD s/p PCI with BMS in 2008, chronic back pain from spinal stenosis admitted with worsening back pain. MRI LS Spine shows severe spinal stenosis with  last disc herniation at L2-L3. Seen by neurosurgery, plans are for surgery early this week. Cardiology consulted for preoperative evaluation, echocardiogram demonstrates preserved ejection fraction. Back pain significantly improved with IV steroids.     Hospital Course:     Intractable back pain: Secondary to severe spinal stenosis and a large disc herniation at L3-L4 level. Although some concern for possible discitis on MRI-clinically no suggestion of discitis. CRP and ESR only minimally elevated, afebrile with negative blood cultures. Underwent L-spine discectomy by [name] on 05/11/2015, to wear lumbar brace when out of bed, seen by neurosurgery and PTT. Will be discharged home with home PT. We'll follow with PCP and neurosurgery post discharge.  Patient was discharged on 05/13/2015, he wished to stay 1 more night. No acute issues cleared by neurosurgery will go today.   Acute on chronic diastolic heart failure EF 55-60%: Likely causing worsening lower extremity edema, improved with IV Lasix. Lower extremity venous duplex unremarkable. Continue diuretics at home. Salt and fluid  restriction, patient counseled.   Hyponatremia: Resolved with diuresis was due to CHF.   History of CAD-status post bare metal stent 2008: Continue aspirin, statin and beta blocker. Without chest pain or shortness of breath-but very sedentary lifestyle for the past few months/years given slowly progressively worsening back pain. Reduced Bystolic dose as resting heart rate in mid 40s.  Hypertension: Due to low heart rate and edema Norvasc has been stopped, Bystolic dose has been reduced. Have added hydralazine for blood pressure control.  Dyslipidemia: Continue with Crestor  Obesity: Counseled regarding importance of weight loss  OSA on CPAP: OSA likely secondary to obesity  History of peripheral vascular disease status post aortofemoral bypass.  Incidental enlarged lymph nodes in the right leg. Outpatient  follow-up with PCP.   Type 2 diabetes: Tinea home regimen PCP to monitor glycemic control and A1c.       Discharge Condition:Stable  Follow UP  Follow-up Information    Follow up with POLITE,RONALD D, MD. Schedule an appointment as soon as possible for a visit in 1 week.   Specialty:  Internal Medicine   Why:  Appointment is Tuesday, 05/19/2015 at 11:30am   Contact information:   [address] [place] [address] [place] [address] [phone]       Follow up with Earleen Newport, MD. Schedule an appointment as soon as possible for a visit in 1 week.   Specialty:  Neurosurgery   Why:  Appointment is Wednesday, 05/20/2015 at 11:15am   Contact information:   [address] [place] [address] [place] [address] [phone]       Follow up with [facility].   Why:  Home with HHPT/OT/HH Aide   Contact information:   [address] [place] [address] [phone]       Follow up with [facility].   Why:  Hospital Bed, RW   Contact information:   [address] [place] [address] [phone]        Consults obtained - N.Surg, Cards  Diet and Activity recommendation: See Discharge Instructions below  Discharge Instructions           Discharge Instructions    Discharge instructions    Complete by:  As directed   Follow with Primary MD Kandice Hams, MD in 7 days   Get CBC, CMP, 2 view Chest X ray checked  by Primary MD next visit.    Activity: Her activity instructions as given by neurosurgery, where you're back brace as instructed.   Disposition Home     Diet: Heart Healthy    For Heart failure patients - Check your Weight same time everyday, if you gain over 2 pounds, or you develop in leg swelling, experience more shortness of breath or chest pain, call your Primary MD immediately. Follow Cardiac Low Salt Diet and 1.5 lit/day fluid restriction.   On your next visit with your primary care  physician please Get Medicines reviewed and adjusted.   Please request your Prim.MD to go over all Hospital Tests and Procedure/Radiological results at the follow up, please get all Hospital records sent to your Prim MD by signing hospital release before you go home.   If you experience worsening of your admission symptoms, develop shortness of breath, life threatening emergency, suicidal or homicidal thoughts you must seek medical attention immediately by calling 911 or calling your MD immediately  if symptoms less severe.  You Must read complete instructions/literature along with all  the possible adverse reactions/side effects for all the Medicines you take and that have been prescribed to you. Take any new Medicines after you have completely understood and accpet all the possible adverse reactions/side effects.   Do not drive, operating heavy machinery, perform activities at heights, swimming or participation in water activities or provide baby sitting services if your were admitted for syncope or siezures until you have seen by Primary MD or a Neurologist and advised to do so again.  Do not drive when taking Pain medications.    Do not take more than prescribed Pain, Sleep and Anxiety Medications  Special Instructions: If you have smoked or chewed Tobacco  in the last 2 yrs please stop smoking, stop any regular Alcohol  and or any Recreational drug use.  Wear Seat belts while driving.   Please note  You were cared for by a hospitalist during your hospital stay. If you have any questions about your discharge medications or the care you received while you were in the hospital after you are discharged, you can call the unit and asked to speak with the hospitalist on call if the hospitalist that took care of you is not available. Once you are discharged, your primary care physician will handle any further medical issues. Please note that NO REFILLS for any discharge medications will be  authorized once you are discharged, as it is imperative that you return to your primary care physician (or establish a relationship with a primary care physician if you do not have one) for your aftercare needs so that they can reassess your need for medications and monitor your lab values.     Increase activity slowly    Complete by:  As directed              Discharge Medications       Medication List    STOP taking these medications        amLODipine 5 MG tablet  Commonly known as:  NORVASC      TAKE these medications        aspirin EC 325 MG tablet  Take 325 mg by mouth daily.     bisacodyl 5 MG EC tablet  Commonly known as:  DULCOLAX  Take 2 tablets (10 mg total) by mouth daily as needed for moderate constipation.     CRESTOR 10 MG tablet  Generic drug:  rosuvastatin  TAKE 1 TABLET BY MOUTH EVERY DAY     diazepam 10 MG tablet  Commonly known as:  VALIUM  Take 10 mg by mouth every 6 (six) hours as needed for anxiety.     furosemide 40 MG tablet  Commonly known as:  LASIX  Take 1 tablet (40 mg total) by mouth daily.     hydrALAZINE 25 MG tablet  Commonly known as:  APRESOLINE  Take 1 tablet (25 mg total) by mouth 3 (three) times daily.     HYDROcodone-acetaminophen 10-325 MG tablet  Commonly known as:  NORCO  Take 1 tablet by mouth every 6 (six) hours as needed for moderate pain.     lisinopril 20 MG tablet  Commonly known as:  PRINIVIL,ZESTRIL  Take 1 tablet (20 mg total) by mouth daily.     metFORMIN 500 MG tablet  Commonly known as:  GLUCOPHAGE  Take 500 mg by mouth daily.     nebivolol 5 MG tablet  Commonly known as:  BYSTOLIC  Take 1 tablet (5 mg total) by mouth daily.  nitroGLYCERIN 0.4 MG SL tablet  Commonly known as:  NITROSTAT  Place 0.4 mg under the tongue every 5 (five) minutes as needed for chest pain.        Major procedures and Radiology Reports - PLEASE review detailed and final reports for all details, in brief -    L-spine  surgery by [name] on 05/11/2015 - Laminotomies bilaterally at L3-4 and L4-5 with complete discectomy L3-4 and L4-5 decompression of the L3-L4 and L5 nerve roots individually   TTE  - Left ventricle: The cavity size was normal. Systolic function wasnormal. The estimated ejection fraction was in the range of 55%to 60%. Wall motion was normal; there were no regional wallmotion abnormalities. Features are consistent with a pseudonormalleft ventricular filling pattern, with concomitant abnormalrelaxation and increased filling pressure (grade 2 diastolicdysfunction). Doppler parameters are consistent with highventricular filling pressure. - Aortic valve: Moderate focal calcification involving thenoncoronary cusp. - Mitral valve: There was trivial regurgitation. - Atrial septum: There was increased thickness of the septum,consistent with lipomatous hypertrophy. Echo contrast studyshowed no right-to-left atrial level shunt, at baseline or withprovocation.   Leg [place]  No evidence of deep vein thrombosis involving the visualized veins of the right lower extremity and left lower extremity. Incidental findings are consistent with: enlarged lymph node on the right. - No evidence of Baker&'s cyst on the right or left.   Dg Lumbar Spine 2-3 Views  05/11/2015  CLINICAL DATA:  Lumbar decompression with posterior fusion EXAM: DG C-ARM 61-120 MIN; LUMBAR SPINE - 2-3 VIEW COMPARISON:  None. FLUOROSCOPY TIME:  Radiation Exposure Index (as provided by the fluoroscopic device): Not available If the device does not provide the exposure index: Fluoroscopy Time:  15 seconds Number of Acquired Images:  2 FINDINGS: Pedicle screws are noted at L3, L4 and L5 with posterior fixation. Interbody fusion is noted at L3-4 and L4-5. IMPRESSION: Lumbar decompression with posterior fusion Electronically Signed   By: Inez Catalina M.D.   On: 05/11/2015 22:58   Mr Lumbar Spine Wo Contrast  05/07/2015  CLINICAL DATA:   68 year old male with lumbar back pain, chronic but progressive x3 weeks. Severe pain, unable to have a outpatient lumbar MRI. Subsequent encounter. EXAM: MRI LUMBAR SPINE WITHOUT CONTRAST TECHNIQUE: Multiplanar, multisequence MR imaging of the lumbar spine was performed. No intravenous contrast was administered. COMPARISON:  [facility] lumbar radiographs 04/29/2015. [facility] 02/26/2012. [facility] lumbar MRI 07/14/2009. FINDINGS: The examination had to be discontinued prior to completion due to pain. Levoconvex lumbar scoliosis demonstrated on the recent radiographs. Normal lumbar segmentation, and this corresponds to the numbering system used on the 2011 comparison. Since 2011 anterolisthesis at L3-L4 and retrolisthesis at L4-L5 have progressed. Severe disc space loss at both levels has progressed. Trace vacuum disc anteriorly at L3-L4 on the recent radiographs. Progressed endplate spurring and sclerosis, maximal at L4-L5. On the sagittal STIR imaging today there is confluent STIR hyperintensity in the L3, L4, and L5 vertebral bodies mostly along the endplates. No suspicious increased T2 or STIR signal identified within the intervening disc spaces. No definite prevertebral/paraspinal STIR hyperintensity. Visualized lower thoracic spinal cord is normal with conus medularis at L1-L2. 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 T12-L1:  Negative. L1-L2: Circumferential disc bulge. Borderline to mild spinal stenosis. L2-L3: Circumferential disc osteophyte complex and mild to moderate facet and ligament flavum hypertrophy. Mild to moderate spinal stenosis, not significantly changed since 2011. L3-L4: Bulky circumferential disc osteophyte complex and progressed severe facet hypertrophy. Severe spinal  stenosis here appears increased since 2011. The sagittal images suggest a disc extrusion, sequestered fragment contributing to the appearance. See series 3, image 7 and series 8,  image 20. The fragment measures up to 13 mm largest dimension. L4-L5: Severe disc space loss and circumferential disc osteophyte complex. Chronic moderate facet hypertrophy. Still, right lateral recess patency appears improved since 2011. Chronic moderate right greater than left foraminal stenosis appears stable and mostly related to endplate spurring. L5-S1: Left eccentric chronic circumferential disc osteophyte complex appears stable since 2011 along with moderate facet hypertrophy. Moderate left greater than right L5 foraminal stenosis appears stable. IMPRESSION: 1. Prematurely discontinued and motion degraded study despite maximal patient medication and multiple imaging attempts. 2. Severe spinal stenosis at L3-L4 appears progressed since 2011 and in part related to a sequestered disc fragment best seen on series 3, image 7. Grade 1 anterolisthesis also has progressed at this level along with severe facet hypertrophy. 3. Confluent marrow edema in the L3, L4, and L5 vertebral bodies is favored to be degenerative in nature owing to the progressed and now severe disc degeneration at these levels. Infection is felt less likely but in light of the patient's diabetes, blood cultures and correlation with serologic markers of infection are recommended. Electronically Signed   By: Genevie Ann M.D.   On: 05/07/2015 19:52   Dg Lumbar Spine 1 View  05/11/2015  CLINICAL DATA:  L3-4 no L4-5 posterior laminectomy and fusion. EXAM: LUMBAR SPINE - 1 VIEW COMPARISON:  Previous examinations, including the lumbar spine MR dated 05/07/2015 and abdomen and pelvis CT dated 02/24/2010. FINDINGS: A portable cross-table lateral view of the lumbar spine is limited by motion blurring. Based on the labeling of the previous MR and in correlation with the previous CT, the last visible open disc space is labeled the L5-S1 level. There is stable mild retrolisthesis at the L4-5 level. A metallic localizer is demonstrated its tip overlying the L4  spinous process and a second metallic localizer is demonstrated with its tip overlying the space between the L3 and L4 spinous processes. IMPRESSION: Localizers, as described above. Electronically Signed   By: Claudie Revering M.D.   On: 05/11/2015 19:04   Dg C-arm 1-60 Min  05/11/2015  CLINICAL DATA:  Lumbar decompression with posterior fusion EXAM: DG C-ARM 61-120 MIN; LUMBAR SPINE - 2-3 VIEW COMPARISON:  None. FLUOROSCOPY TIME:  Radiation Exposure Index (as provided by the fluoroscopic device): Not available If the device does not provide the exposure index: Fluoroscopy Time:  15 seconds Number of Acquired Images:  2 FINDINGS: Pedicle screws are noted at L3, L4 and L5 with posterior fixation. Interbody fusion is noted at L3-4 and L4-5. IMPRESSION: Lumbar decompression with posterior fusion Electronically Signed   By: Inez Catalina M.D.   On: 05/11/2015 22:58    Micro Results      Recent Results (from the past 240 hour(s))  Culture, blood (routine x 2)     Status: None   Collection Time: 05/07/15  9:50 PM  Result Value Ref Range Status   Specimen Description BLOOD RIGHT WRIST  Final   Special Requests BOTTLES DRAWN AEROBIC AND ANAEROBIC 5CC  Final   Culture NO GROWTH 5 DAYS  Final   Report Status 05/12/2015 FINAL  Final  Culture, blood (routine x 2)     Status: None   Collection Time: 05/07/15  9:56 PM  Result Value Ref Range Status   Specimen Description BLOOD RIGHT HAND  Final   Special Requests  BOTTLES DRAWN AEROBIC AND ANAEROBIC 5CC  Final   Culture NO GROWTH 5 DAYS  Final   Report Status 05/12/2015 FINAL  Final       Today   Subjective    Kenneth Lee today has no headache,no chest abdominal pain,no new weakness tingling or numbness, feels much better wants to go home today.     Objective   Blood pressure 122/60, pulse 56, temperature 97.8 F (36.6 C), temperature source Oral, resp. rate 20, height 6' (1.829 m), weight 119.5 kg (263 lb 7.2 oz), SpO2 94  %.   Intake/Output Summary (Last 24 hours) at 05/14/15 0936 Last data filed at 05/14/15 0924  Gross per 24 hour  Intake      3 ml  Output      0 ml  Net      3 ml    Exam Awake Alert, Oriented x 3, No new F.N deficits, Normal affect [place].AT,PERRAL Supple Neck,No JVD, No cervical lymphadenopathy appriciated.  Symmetrical Chest wall movement, Good air movement bilaterally, CTAB RRR,No Gallops,Rubs or new Murmurs, No Parasternal Heave +ve B.Sounds, Abd Soft, Non tender, No organomegaly appriciated, No rebound -guarding or rigidity. No Cyanosis, Clubbing or edema, No new Rash or bruise   Data Review   CBC w Diff:  Lab Results  Component Value Date   WBC 11.4* 05/13/2015   HGB 11.3* 05/13/2015   HCT 32.9* 05/13/2015   PLT 126* 05/13/2015   LYMPHOPCT 24 05/08/2015   MONOPCT 7 05/08/2015   EOSPCT 1 05/08/2015   BASOPCT 0 05/08/2015    CMP:  Lab Results  Component Value Date   NA 135 05/13/2015   K 3.7 05/13/2015   CL 97* 05/13/2015   CO2 29 05/13/2015   BUN 26* 05/13/2015   CREATININE 0.89 05/13/2015   PROT 6.5 05/09/2015   ALBUMIN 3.5 05/09/2015   BILITOT 0.7 05/09/2015   ALKPHOS 71 05/09/2015   AST 29 05/09/2015   ALT 25 05/09/2015  .   Total Time in preparing paper work, data evaluation and todays exam - 35 minutes  Thurnell Lose M.D on 05/14/2015 at 9:36 AM  [facility]   Office  [phone]

## 2015-05-13 NOTE — Discharge Instructions (Signed)
Follow with Primary MD Kandice Hams, MD in 7 days   Get CBC, CMP, 2 view Chest X ray checked  by Primary MD next visit.    Activity: Her activity instructions as given by neurosurgery, where you're back brace as instructed.   Disposition Home     Diet: Heart Healthy    For Heart failure patients - Check your Weight same time everyday, if you gain over 2 pounds, or you develop in leg swelling, experience more shortness of breath or chest pain, call your Primary MD immediately. Follow Cardiac Low Salt Diet and 1.5 lit/day fluid restriction.   On your next visit with your primary care physician please Get Medicines reviewed and adjusted.   Please request your Prim.MD to go over all Hospital Tests and Procedure/Radiological results at the follow up, please get all Hospital records sent to your Prim MD by signing hospital release before you go home.   If you experience worsening of your admission symptoms, develop shortness of breath, life threatening emergency, suicidal or homicidal thoughts you must seek medical attention immediately by calling 911 or calling your MD immediately  if symptoms less severe.  You Must read complete instructions/literature along with all the possible adverse reactions/side effects for all the Medicines you take and that have been prescribed to you. Take any new Medicines after you have completely understood and accpet all the possible adverse reactions/side effects.   Do not drive, operating heavy machinery, perform activities at heights, swimming or participation in water activities or provide baby sitting services if your were admitted for syncope or siezures until you have seen by Primary MD or a Neurologist and advised to do so again.  Do not drive when taking Pain medications.    Do not take more than prescribed Pain, Sleep and Anxiety Medications  Special Instructions: If you have smoked or chewed Tobacco  in the last 2 yrs please stop smoking, stop  any regular Alcohol  and or any Recreational drug use.  Wear Seat belts while driving.   Please note  You were cared for by a hospitalist during your hospital stay. If you have any questions about your discharge medications or the care you received while you were in the hospital after you are discharged, you can call the unit and asked to speak with the hospitalist on call if the hospitalist that took care of you is not available. Once you are discharged, your primary care physician will handle any further medical issues. Please note that NO REFILLS for any discharge medications will be authorized once you are discharged, as it is imperative that you return to your primary care physician (or establish a relationship with a primary care physician if you do not have one) for your aftercare needs so that they can reassess your need for medications and monitor your lab values.

## 2015-05-13 NOTE — Progress Notes (Signed)
Patient ID: Kenneth Lee, male   DOB: [birth year], 69 y.o.   MRN: [id number] Vital signs are stable Mobilizing slowly Motor function is improving Continue supportive care May need SNIF placement

## 2015-05-13 NOTE — Progress Notes (Signed)
Physical Therapy Treatment Patient Details Name: Kenneth Lee MRN: [id number] DOB: [birth year] Today's Date: 05/13/2015    History of Present Illness Pt is a 69 y.o. male admitted for severe back pain. MRI reveals herniated disc at L3-4.  05/11/15 L3-5 fusion    PT Comments    Patient progressing well. Patient able to recall precautions, however requires cues to maintain precautions with functional mobility. Patient required min assist (or use of rails) to perform bed mobility. He feels he will need hospital bed at home. Discussed discharge options and continue to recommend home with HHPT as his daughter plans to stay with him as long as he needs her.    Follow Up Recommendations  Home health PT;Supervision for mobility/OOB     Equipment Recommendations  Rolling walker with 5" wheels;3in1 (PT);Hospital bed (pt thinks he'll need hospital bed)    Recommendations for Other Services       Precautions / Restrictions Precautions Precautions: Back Precaution Booklet Issued: Yes (comment) Precaution Comments: able to state 3/3 back precautions Required Braces or Orthoses: Spinal Brace Spinal Brace: Lumbar corset;Applied in sitting position Restrictions Weight Bearing Restrictions: No    Mobility  Bed Mobility Overal bed mobility: Needs Assistance Bed Mobility: Rolling;Sit to Sidelying Rolling: Min assist       Sit to sidelying: Min assist General bed mobility comments: bed flat, no rail; vc for technique and to assist raising his 2nd leg onto bed; assist to roll side to back (pt fearful as he states he could not tolerate lying supine prior to surgery--pleased that he tolerated without incr pain)  Transfers Overall transfer level: Needs assistance Equipment used: Rolling walker (2 wheeled) Transfers: Sit to/from Stand Sit to Stand: From elevated surface;Min guard (simulate bed height at home)         General transfer comment: vc for safe use of  RW  Ambulation/Gait Ambulation/Gait assistance: Min guard Ambulation Distance (Feet): 170 Feet Assistive device: Rolling walker (2 wheeled) Gait Pattern/deviations: Step-through pattern;Decreased stride length;Trunk flexed Gait velocity: decreased   General Gait Details: slight forward flexion (mostly via hips) and able to correct posture with cues (required repeated cues to maintain)   Stairs            Wheelchair Mobility    Modified Rankin (Stroke Patients Only)       Balance           Standing balance support: No upper extremity supported Standing balance-Leahy Scale: Poor                      Cognition Arousal/Alertness: Awake/alert Behavior During Therapy: WFL for tasks assessed/performed Overall Cognitive Status: Within Functional Limits for tasks assessed                      Exercises      General Comments General comments (skin integrity, edema, etc.): Daughter not present      Pertinent Vitals/Pain Pain Assessment: 0-10 Pain Score: 5  Pain Location: back Pain Descriptors / Indicators: Aching;Operative site guarding Pain Intervention(s): Limited activity within patient's tolerance;Monitored during session;Premedicated before session;Repositioned    Home Living                      Prior Function            PT Goals (current goals can now be found in the care plan section) Acute Rehab PT Goals Patient Stated Goal: learn to safely mobilize PT Goal  Formulation: With patient Time For Goal Achievement: 05/18/15 Potential to Achieve Goals: Good Progress towards PT goals: Progressing toward goals    Frequency  Min 5X/week    PT Plan Current plan remains appropriate;Equipment recommendations need to be updated    Co-evaluation             End of Session Equipment Utilized During Treatment: Back brace Activity Tolerance: Patient tolerated treatment well Patient left: with call bell/phone within reach;in  bed     Time: 1025-1053 PT Time Calculation (min) (ACUTE ONLY): 28 min  Charges:  $Gait Training: 23-37 mins                    G Codes:      Hallelujah Wysong 05/31/2015, 11:03 AM Pager [phone]

## 2015-05-13 NOTE — Progress Notes (Signed)
RT note: Patient stated that he will place himself on CPAP tonight. Patient has his own nasal pillows and tubing.

## 2015-05-14 DIAGNOSIS — M5441 Lumbago with sciatica, right side: Secondary | ICD-10-CM

## 2015-05-14 LAB — GLUCOSE, CAPILLARY
Glucose-Capillary: 139 mg/dL — ABNORMAL HIGH (ref 65–99)
Glucose-Capillary: 147 mg/dL — ABNORMAL HIGH (ref 65–99)

## 2015-05-14 MED ORDER — NEBIVOLOL HCL 5 MG PO TABS
5.0000 mg | ORAL_TABLET | Freq: Every day | ORAL | Status: DC
  Administered 2015-05-14: 5 mg via ORAL
  Filled 2015-05-14: qty 1

## 2015-05-14 MED ORDER — NEBIVOLOL HCL 10 MG PO TABS: 10.0000 mg | ORAL_TABLET | Freq: Every day | ORAL | Status: DC

## 2015-05-14 NOTE — Progress Notes (Signed)
Occupational Therapy Treatment Patient Details Name: Kenneth Lee MRN: [id number] DOB: 01-28-1946 Today's Date: 05/14/2015    History of present illness Pt is a 69 y.o. male admitted for severe back pain. MRI reveals herniated disc at L3-4.  05/11/15 L3-5 fusion   OT comments  Pt progressing towards acute OT goals. Focus of session was LB ADLs and home setup. Daughter present for session and assisting at d/c. Session details below. Pt awaiting d/c.   Follow Up Recommendations  No OT follow up;Supervision - Intermittent;Other (comment)    Equipment Recommendations  3 in 1 bedside comode    Recommendations for Other Services      Precautions / Restrictions Precautions Precautions: Back Precaution Comments: able to state 3/3 back precautions Required Braces or Orthoses: Spinal Brace Spinal Brace: Lumbar corset;Applied in sitting position Restrictions Weight Bearing Restrictions: No       Mobility Bed Mobility               General bed mobility comments: in recliner, reports he will have hospital bed  Transfers Overall transfer level: Needs assistance Equipment used: Rolling walker (2 wheeled) Transfers: Sit to/from Stand Sit to Stand: Min assist;Min guard         General transfer comment: with and without pillow to boost height of recliner. min guard from higher seat surface    Balance Overall balance assessment: Needs assistance Sitting-balance support: Feet supported Sitting balance-Leahy Scale: Good     Standing balance support: Bilateral upper extremity supported;During functional activity Standing balance-Leahy Scale: Poor                     ADL Overall ADL's : Needs assistance/impaired                 Upper Body Dressing : Minimal assistance;Sitting Upper Body Dressing Details (indicate cue type and reason): assist to pull shirt over head Lower Body Dressing: Minimal assistance;Sit to/from stand Lower Body Dressing Details  (indicate cue type and reason): Assist to don pants onto feet and to adjust in standing position. Pt removing brace in standing position despite therapist telling pt he is suppose to don in sitting position. Educated on managing brace during UB/LB dressing               General ADL Comments: Reveiwed ADL education with daughter present including AE and DME needs.       Vision                     Perception     Praxis      Cognition   Behavior During Therapy: WFL for tasks assessed/performed Overall Cognitive Status: Within Functional Limits for tasks assessed                       Extremity/Trunk Assessment               Exercises     Shoulder Instructions       General Comments      Pertinent Vitals/ Pain       Pain Assessment: 0-10 Pain Score: 5  Pain Location: back, back of legs Pain Descriptors / Indicators: Aching;Cramping Pain Intervention(s): Limited activity within patient's tolerance;Monitored during session;Repositioned;Premedicated before session  Home Living  Prior Functioning/Environment              Frequency Min 2X/week     Progress Toward Goals  OT Goals(current goals can now be found in the care plan section)  Progress towards OT goals: Progressing toward goals  Acute Rehab OT Goals Patient Stated Goal: learn to safely mobilize OT Goal Formulation: With patient Time For Goal Achievement: 05/19/15 Potential to Achieve Goals: Good ADL Goals Pt Will Perform Grooming: with modified independence;standing;sitting Pt Will Perform Lower Body Bathing: with modified independence;with adaptive equipment;sit to/from stand Pt Will Perform Lower Body Dressing: with modified independence;with adaptive equipment;sit to/from stand Pt Will Perform Toileting - Clothing Manipulation and hygiene: with modified independence;with adaptive equipment;sit to/from  stand;sitting/lateral leans Pt Will Perform Tub/Shower Transfer: Tub transfer;with modified independence;ambulating;3 in 1;rolling walker  Plan Discharge plan remains appropriate    Co-evaluation                 End of Session Equipment Utilized During Treatment: Rolling walker;Back brace   Activity Tolerance Patient tolerated treatment well   Patient Left in chair;with call bell/phone within reach;with family/visitor present;Other (comment) (awaiting d/c)   Nurse Communication          Time: 9735-3299 OT Time Calculation (min): 14 min  Charges: OT General Charges $OT Visit: 1 Procedure OT Treatments $Self Care/Home Management : 8-22 mins  Hortencia Pilar 05/14/2015, 11:18 AM

## 2015-05-14 NOTE — Progress Notes (Signed)
Patient discharged home with daughter, summary reviewed, prescriptions given IV removed.

## 2015-05-14 NOTE — Care Management Note (Signed)
Case Management Note  Patient Details  Name: JAZE RODINO MRN: [id number] Date of Birth: 1945/11/20  Subjective/Objective:                    Action/Plan: Patient is discharging home with [facility] services today.  Arrangements were made on 05/13/15 with [facility] for [facility] services and a hospital bed.  This was confirmed with Jeannetta Nap and [place] with [facility].  Expected Discharge Date:                  Expected Discharge Plan:  [place]  In-House Referral:     Discharge planning Services  CM Consult  Post Acute Care Choice:    Choice offered to:  Patient  DME Arranged:  Walker rolling, Hospital bed (Denied need for 3-n-1) DME Agency:  [place]:  PT, OT, Nurse's Aide [place] Agency:  [place]  Status of Service:  Completed, signed off  Medicare Important Message Given:  Yes-second notification given Date Medicare IM Given:    Medicare IM give by:    Date Additional Medicare IM Given:    Additional Medicare Important Message give by:     If discussed at [place] of Stay Meetings, dates discussed:    Additional Comments:  Rolm Baptise, RN 05/14/2015, 10:00 AM [phone]

## 2015-05-14 NOTE — Progress Notes (Signed)
Physical Therapy Treatment Patient Details Name: Kenneth Lee MRN: [id number] DOB: 1946-04-16 Today's Date: 05/14/2015    History of Present Illness Pt is a 69 y.o. male admitted for severe back pain. MRI reveals herniated disc at L3-4.  05/11/15 L3-5 fusion    PT Comments    On arrival pt immediately verbalizing his dissatisfaction with planned discharge home today. (Discussed pt's concerns with pt's RN Annie Main) and he is aware and attempting to address these with pt. Encouraged stair training prior to discharge and pt refused demonstration/hands-on training. Requested PT verbally educate him due to "too tired to do that." Pt did agree to perform transfers and gait training. Patient mobilizing with minguard-supervision with RW (due to need for cues for safe use of RW and dizziness with gait). Daughter not present, but previously stated she will stay with patient 24/7 on discharge.    Follow Up Recommendations  Home health PT;Supervision for mobility/OOB     Equipment Recommendations  Rolling walker with 5" wheels;3in1 (PT);Hospital bed (pt thinks he'll need hospital bed)    Recommendations for Other Services       Precautions / Restrictions Precautions Precautions: Back Precaution Comments: able to state 3/3 back precautions Required Braces or Orthoses: Spinal Brace Spinal Brace: Lumbar corset;Applied in sitting position Restrictions Weight Bearing Restrictions: No    Mobility  Bed Mobility               General bed mobility comments: pt up in chair; reports he will have hospital bed at home; pt did not want to physically demonstrate proper technique, however was able to verbalize  Transfers Overall transfer level: Needs assistance Equipment used: Rolling walker (2 wheeled) Transfers: Sit to/from Stand Sit to Stand: From elevated surface;Min guard (simulate bed height at home)         General transfer comment: vc for safe use of RW (pt continues to put one  hand on RW despite cues); minguard from recliner due to recliner rolled as coming to stand  Ambulation/Gait Ambulation/Gait assistance: Supervision Ambulation Distance (Feet): 170 Feet Assistive device: Rolling walker (2 wheeled) Gait Pattern/deviations: Step-through pattern Gait velocity: decreased   General Gait Details: slight forward flexion (mostly via hips) and able to correct posture with cues (required repeated cues to maintain)   Stairs Stairs:  (pt refused; agreed to verbal education and verbalized back)          Wheelchair Mobility    Modified Rankin (Stroke Patients Only)       Balance                                    Cognition Arousal/Alertness: Awake/alert Behavior During Therapy: WFL for tasks assessed/performed Overall Cognitive Status: Within Functional Limits for tasks assessed                      Exercises      General Comments General comments (skin integrity, edema, etc.): daughter not present; pt feels he is not ready to go home and states he was told he must be out by 10:00. Spoke with RN and pt got this impression from hospitalist MD and RN has tried to re-educate pt on d/c process.      Pertinent Vitals/Pain Pain Assessment: 0-10 Pain Score: 7  Pain Location: back; back of legs Pain Descriptors / Indicators: Aching;Cramping Pain Intervention(s): Limited activity within patient's tolerance;Monitored during session;Repositioned;Patient requesting pain meds-RN  notified    Home Living                      Prior Function            PT Goals (current goals can now be found in the care plan section) Acute Rehab PT Goals Patient Stated Goal: learn to safely mobilize Time For Goal Achievement: 05/18/15 Progress towards PT goals: Not progressing toward goals - comment (refused steps; dizziness today)    Frequency  Min 5X/week    PT Plan Current plan remains appropriate;Equipment recommendations need to  be updated    Co-evaluation             End of Session Equipment Utilized During Treatment: Back brace Activity Tolerance: Patient limited by fatigue;Treatment limited secondary to medical complications (Comment) (reported lightheadedness during last 80 ft) Patient left: with call bell/phone within reach;in chair     Time: 3785-8850 PT Time Calculation (min) (ACUTE ONLY): 18 min  Charges:  $Gait Training: 8-22 mins                    G Codes:      Humphrey Guerreiro 05-28-15, 10:23 AM Pager [phone]

## 2015-05-15 DIAGNOSIS — I10 Essential (primary) hypertension: Secondary | ICD-10-CM | POA: Diagnosis not present

## 2015-05-15 DIAGNOSIS — G4733 Obstructive sleep apnea (adult) (pediatric): Secondary | ICD-10-CM | POA: Diagnosis not present

## 2015-05-15 DIAGNOSIS — E785 Hyperlipidemia, unspecified: Secondary | ICD-10-CM | POA: Diagnosis not present

## 2015-05-15 DIAGNOSIS — E669 Obesity, unspecified: Secondary | ICD-10-CM | POA: Diagnosis not present

## 2015-05-15 DIAGNOSIS — Z7984 Long term (current) use of oral hypoglycemic drugs: Secondary | ICD-10-CM | POA: Diagnosis not present

## 2015-05-15 DIAGNOSIS — Z7982 Long term (current) use of aspirin: Secondary | ICD-10-CM | POA: Diagnosis not present

## 2015-05-15 DIAGNOSIS — Z955 Presence of coronary angioplasty implant and graft: Secondary | ICD-10-CM | POA: Diagnosis not present

## 2015-05-15 DIAGNOSIS — Z4789 Encounter for other orthopedic aftercare: Secondary | ICD-10-CM | POA: Diagnosis not present

## 2015-05-15 DIAGNOSIS — Z87891 Personal history of nicotine dependence: Secondary | ICD-10-CM | POA: Diagnosis not present

## 2015-05-15 DIAGNOSIS — Z981 Arthrodesis status: Secondary | ICD-10-CM | POA: Diagnosis not present

## 2015-05-15 DIAGNOSIS — I2510 Atherosclerotic heart disease of native coronary artery without angina pectoris: Secondary | ICD-10-CM | POA: Diagnosis not present

## 2015-05-15 DIAGNOSIS — E119 Type 2 diabetes mellitus without complications: Secondary | ICD-10-CM | POA: Diagnosis not present

## 2015-05-16 DIAGNOSIS — I10 Essential (primary) hypertension: Secondary | ICD-10-CM | POA: Diagnosis not present

## 2015-05-16 DIAGNOSIS — I2510 Atherosclerotic heart disease of native coronary artery without angina pectoris: Secondary | ICD-10-CM | POA: Diagnosis not present

## 2015-05-16 DIAGNOSIS — Z4789 Encounter for other orthopedic aftercare: Secondary | ICD-10-CM | POA: Diagnosis not present

## 2015-05-16 DIAGNOSIS — E785 Hyperlipidemia, unspecified: Secondary | ICD-10-CM | POA: Diagnosis not present

## 2015-05-16 DIAGNOSIS — G4733 Obstructive sleep apnea (adult) (pediatric): Secondary | ICD-10-CM | POA: Diagnosis not present

## 2015-05-16 DIAGNOSIS — E119 Type 2 diabetes mellitus without complications: Secondary | ICD-10-CM | POA: Diagnosis not present

## 2015-05-18 DIAGNOSIS — Z4789 Encounter for other orthopedic aftercare: Secondary | ICD-10-CM | POA: Diagnosis not present

## 2015-05-18 DIAGNOSIS — I2510 Atherosclerotic heart disease of native coronary artery without angina pectoris: Secondary | ICD-10-CM | POA: Diagnosis not present

## 2015-05-18 DIAGNOSIS — E785 Hyperlipidemia, unspecified: Secondary | ICD-10-CM | POA: Diagnosis not present

## 2015-05-18 DIAGNOSIS — G4733 Obstructive sleep apnea (adult) (pediatric): Secondary | ICD-10-CM | POA: Diagnosis not present

## 2015-05-18 DIAGNOSIS — E119 Type 2 diabetes mellitus without complications: Secondary | ICD-10-CM | POA: Diagnosis not present

## 2015-05-18 DIAGNOSIS — I10 Essential (primary) hypertension: Secondary | ICD-10-CM | POA: Diagnosis not present

## 2015-05-19 DIAGNOSIS — Z4789 Encounter for other orthopedic aftercare: Secondary | ICD-10-CM | POA: Diagnosis not present

## 2015-05-19 DIAGNOSIS — E119 Type 2 diabetes mellitus without complications: Secondary | ICD-10-CM | POA: Diagnosis not present

## 2015-05-19 DIAGNOSIS — I10 Essential (primary) hypertension: Secondary | ICD-10-CM | POA: Diagnosis not present

## 2015-05-19 DIAGNOSIS — M545 Low back pain: Secondary | ICD-10-CM | POA: Diagnosis not present

## 2015-05-19 DIAGNOSIS — E785 Hyperlipidemia, unspecified: Secondary | ICD-10-CM | POA: Diagnosis not present

## 2015-05-19 DIAGNOSIS — I2510 Atherosclerotic heart disease of native coronary artery without angina pectoris: Secondary | ICD-10-CM | POA: Diagnosis not present

## 2015-05-19 DIAGNOSIS — R748 Abnormal levels of other serum enzymes: Secondary | ICD-10-CM | POA: Diagnosis not present

## 2015-05-19 DIAGNOSIS — G4733 Obstructive sleep apnea (adult) (pediatric): Secondary | ICD-10-CM | POA: Diagnosis not present

## 2015-05-19 DIAGNOSIS — E1165 Type 2 diabetes mellitus with hyperglycemia: Secondary | ICD-10-CM | POA: Diagnosis not present

## 2015-05-19 DIAGNOSIS — R600 Localized edema: Secondary | ICD-10-CM | POA: Diagnosis not present

## 2015-05-19 DIAGNOSIS — D649 Anemia, unspecified: Secondary | ICD-10-CM | POA: Diagnosis not present

## 2015-05-19 DIAGNOSIS — G4730 Sleep apnea, unspecified: Secondary | ICD-10-CM | POA: Diagnosis not present

## 2015-05-20 DIAGNOSIS — M4806 Spinal stenosis, lumbar region: Secondary | ICD-10-CM | POA: Diagnosis not present

## 2015-05-21 DIAGNOSIS — Z4789 Encounter for other orthopedic aftercare: Secondary | ICD-10-CM | POA: Diagnosis not present

## 2015-05-21 DIAGNOSIS — I10 Essential (primary) hypertension: Secondary | ICD-10-CM | POA: Diagnosis not present

## 2015-05-21 DIAGNOSIS — G4733 Obstructive sleep apnea (adult) (pediatric): Secondary | ICD-10-CM | POA: Diagnosis not present

## 2015-05-21 DIAGNOSIS — E119 Type 2 diabetes mellitus without complications: Secondary | ICD-10-CM | POA: Diagnosis not present

## 2015-05-21 DIAGNOSIS — I2510 Atherosclerotic heart disease of native coronary artery without angina pectoris: Secondary | ICD-10-CM | POA: Diagnosis not present

## 2015-05-21 DIAGNOSIS — E785 Hyperlipidemia, unspecified: Secondary | ICD-10-CM | POA: Diagnosis not present

## 2015-05-22 DIAGNOSIS — E785 Hyperlipidemia, unspecified: Secondary | ICD-10-CM | POA: Diagnosis not present

## 2015-05-22 DIAGNOSIS — I2510 Atherosclerotic heart disease of native coronary artery without angina pectoris: Secondary | ICD-10-CM | POA: Diagnosis not present

## 2015-05-22 DIAGNOSIS — E119 Type 2 diabetes mellitus without complications: Secondary | ICD-10-CM | POA: Diagnosis not present

## 2015-05-22 DIAGNOSIS — Z4789 Encounter for other orthopedic aftercare: Secondary | ICD-10-CM | POA: Diagnosis not present

## 2015-05-22 DIAGNOSIS — G4733 Obstructive sleep apnea (adult) (pediatric): Secondary | ICD-10-CM | POA: Diagnosis not present

## 2015-05-22 DIAGNOSIS — I10 Essential (primary) hypertension: Secondary | ICD-10-CM | POA: Diagnosis not present

## 2015-05-25 DIAGNOSIS — E785 Hyperlipidemia, unspecified: Secondary | ICD-10-CM | POA: Diagnosis not present

## 2015-05-25 DIAGNOSIS — E119 Type 2 diabetes mellitus without complications: Secondary | ICD-10-CM | POA: Diagnosis not present

## 2015-05-25 DIAGNOSIS — G4733 Obstructive sleep apnea (adult) (pediatric): Secondary | ICD-10-CM | POA: Diagnosis not present

## 2015-05-25 DIAGNOSIS — I10 Essential (primary) hypertension: Secondary | ICD-10-CM | POA: Diagnosis not present

## 2015-05-25 DIAGNOSIS — I2510 Atherosclerotic heart disease of native coronary artery without angina pectoris: Secondary | ICD-10-CM | POA: Diagnosis not present

## 2015-05-25 DIAGNOSIS — Z4789 Encounter for other orthopedic aftercare: Secondary | ICD-10-CM | POA: Diagnosis not present

## 2015-05-27 DIAGNOSIS — E119 Type 2 diabetes mellitus without complications: Secondary | ICD-10-CM | POA: Diagnosis not present

## 2015-05-27 DIAGNOSIS — I2510 Atherosclerotic heart disease of native coronary artery without angina pectoris: Secondary | ICD-10-CM | POA: Diagnosis not present

## 2015-05-27 DIAGNOSIS — E785 Hyperlipidemia, unspecified: Secondary | ICD-10-CM | POA: Diagnosis not present

## 2015-05-27 DIAGNOSIS — Z4789 Encounter for other orthopedic aftercare: Secondary | ICD-10-CM | POA: Diagnosis not present

## 2015-05-27 DIAGNOSIS — I10 Essential (primary) hypertension: Secondary | ICD-10-CM | POA: Diagnosis not present

## 2015-05-27 DIAGNOSIS — G4733 Obstructive sleep apnea (adult) (pediatric): Secondary | ICD-10-CM | POA: Diagnosis not present

## 2015-05-29 DIAGNOSIS — E119 Type 2 diabetes mellitus without complications: Secondary | ICD-10-CM | POA: Diagnosis not present

## 2015-05-29 DIAGNOSIS — I2510 Atherosclerotic heart disease of native coronary artery without angina pectoris: Secondary | ICD-10-CM | POA: Diagnosis not present

## 2015-05-29 DIAGNOSIS — I10 Essential (primary) hypertension: Secondary | ICD-10-CM | POA: Diagnosis not present

## 2015-05-29 DIAGNOSIS — G4733 Obstructive sleep apnea (adult) (pediatric): Secondary | ICD-10-CM | POA: Diagnosis not present

## 2015-05-29 DIAGNOSIS — E785 Hyperlipidemia, unspecified: Secondary | ICD-10-CM | POA: Diagnosis not present

## 2015-05-29 DIAGNOSIS — Z4789 Encounter for other orthopedic aftercare: Secondary | ICD-10-CM | POA: Diagnosis not present

## 2015-06-01 DIAGNOSIS — E119 Type 2 diabetes mellitus without complications: Secondary | ICD-10-CM | POA: Diagnosis not present

## 2015-06-01 DIAGNOSIS — G4733 Obstructive sleep apnea (adult) (pediatric): Secondary | ICD-10-CM | POA: Diagnosis not present

## 2015-06-01 DIAGNOSIS — I10 Essential (primary) hypertension: Secondary | ICD-10-CM | POA: Diagnosis not present

## 2015-06-01 DIAGNOSIS — Z4789 Encounter for other orthopedic aftercare: Secondary | ICD-10-CM | POA: Diagnosis not present

## 2015-06-01 DIAGNOSIS — E785 Hyperlipidemia, unspecified: Secondary | ICD-10-CM | POA: Diagnosis not present

## 2015-06-01 DIAGNOSIS — I2510 Atherosclerotic heart disease of native coronary artery without angina pectoris: Secondary | ICD-10-CM | POA: Diagnosis not present

## 2015-06-03 ENCOUNTER — Other Ambulatory Visit: Payer: Self-pay | Admitting: *Deleted

## 2015-06-03 DIAGNOSIS — I2510 Atherosclerotic heart disease of native coronary artery without angina pectoris: Secondary | ICD-10-CM | POA: Diagnosis not present

## 2015-06-03 DIAGNOSIS — Z4789 Encounter for other orthopedic aftercare: Secondary | ICD-10-CM | POA: Diagnosis not present

## 2015-06-03 DIAGNOSIS — E119 Type 2 diabetes mellitus without complications: Secondary | ICD-10-CM | POA: Diagnosis not present

## 2015-06-03 DIAGNOSIS — I10 Essential (primary) hypertension: Secondary | ICD-10-CM | POA: Diagnosis not present

## 2015-06-03 DIAGNOSIS — E785 Hyperlipidemia, unspecified: Secondary | ICD-10-CM | POA: Diagnosis not present

## 2015-06-03 DIAGNOSIS — G4733 Obstructive sleep apnea (adult) (pediatric): Secondary | ICD-10-CM | POA: Diagnosis not present

## 2015-06-03 MED ORDER — HYDRALAZINE HCL 25 MG PO TABS: 25.0000 mg | ORAL_TABLET | Freq: Three times a day (TID) | ORAL | Status: DC

## 2015-06-08 DIAGNOSIS — I10 Essential (primary) hypertension: Secondary | ICD-10-CM | POA: Diagnosis not present

## 2015-06-08 DIAGNOSIS — Z4789 Encounter for other orthopedic aftercare: Secondary | ICD-10-CM | POA: Diagnosis not present

## 2015-06-08 DIAGNOSIS — G4733 Obstructive sleep apnea (adult) (pediatric): Secondary | ICD-10-CM | POA: Diagnosis not present

## 2015-06-08 DIAGNOSIS — E785 Hyperlipidemia, unspecified: Secondary | ICD-10-CM | POA: Diagnosis not present

## 2015-06-08 DIAGNOSIS — E119 Type 2 diabetes mellitus without complications: Secondary | ICD-10-CM | POA: Diagnosis not present

## 2015-06-08 DIAGNOSIS — I2510 Atherosclerotic heart disease of native coronary artery without angina pectoris: Secondary | ICD-10-CM | POA: Diagnosis not present

## 2015-06-12 ENCOUNTER — Other Ambulatory Visit: Payer: Self-pay

## 2015-06-12 DIAGNOSIS — I2510 Atherosclerotic heart disease of native coronary artery without angina pectoris: Secondary | ICD-10-CM | POA: Diagnosis not present

## 2015-06-12 DIAGNOSIS — E119 Type 2 diabetes mellitus without complications: Secondary | ICD-10-CM | POA: Diagnosis not present

## 2015-06-12 DIAGNOSIS — Z4789 Encounter for other orthopedic aftercare: Secondary | ICD-10-CM | POA: Diagnosis not present

## 2015-06-12 DIAGNOSIS — E785 Hyperlipidemia, unspecified: Secondary | ICD-10-CM | POA: Diagnosis not present

## 2015-06-12 DIAGNOSIS — G4733 Obstructive sleep apnea (adult) (pediatric): Secondary | ICD-10-CM | POA: Diagnosis not present

## 2015-06-12 DIAGNOSIS — I10 Essential (primary) hypertension: Secondary | ICD-10-CM | POA: Diagnosis not present

## 2015-06-12 MED ORDER — NEBIVOLOL HCL 5 MG PO TABS: 5.0000 mg | ORAL_TABLET | Freq: Every day | ORAL | Status: DC

## 2015-06-16 ENCOUNTER — Other Ambulatory Visit: Payer: Self-pay

## 2015-06-16 ENCOUNTER — Telehealth: Payer: Self-pay | Admitting: *Deleted

## 2015-06-16 DIAGNOSIS — I10 Essential (primary) hypertension: Secondary | ICD-10-CM | POA: Diagnosis not present

## 2015-06-16 DIAGNOSIS — E785 Hyperlipidemia, unspecified: Secondary | ICD-10-CM | POA: Diagnosis not present

## 2015-06-16 DIAGNOSIS — E119 Type 2 diabetes mellitus without complications: Secondary | ICD-10-CM | POA: Diagnosis not present

## 2015-06-16 DIAGNOSIS — G4733 Obstructive sleep apnea (adult) (pediatric): Secondary | ICD-10-CM | POA: Diagnosis not present

## 2015-06-16 DIAGNOSIS — Z4789 Encounter for other orthopedic aftercare: Secondary | ICD-10-CM | POA: Diagnosis not present

## 2015-06-16 DIAGNOSIS — I2510 Atherosclerotic heart disease of native coronary artery without angina pectoris: Secondary | ICD-10-CM | POA: Diagnosis not present

## 2015-06-16 MED ORDER — NEBIVOLOL HCL 5 MG PO TABS: 5.0000 mg | ORAL_TABLET | Freq: Every day | ORAL | Status: DC

## 2015-06-16 NOTE — Telephone Encounter (Signed)
Patient left a voicemail on the refill line stating that while he was recently in the hospital his bystolic was reduced to 5mg . Looks like this was done by internal medicine. Ok to reorder under Dr Tamala Julian? Please advise. Thanks, MI

## 2015-06-17 NOTE — Telephone Encounter (Signed)
Rx sent in already

## 2015-06-18 DIAGNOSIS — G4733 Obstructive sleep apnea (adult) (pediatric): Secondary | ICD-10-CM | POA: Diagnosis not present

## 2015-06-18 DIAGNOSIS — I10 Essential (primary) hypertension: Secondary | ICD-10-CM | POA: Diagnosis not present

## 2015-06-18 DIAGNOSIS — E785 Hyperlipidemia, unspecified: Secondary | ICD-10-CM | POA: Diagnosis not present

## 2015-06-18 DIAGNOSIS — I2510 Atherosclerotic heart disease of native coronary artery without angina pectoris: Secondary | ICD-10-CM | POA: Diagnosis not present

## 2015-06-18 DIAGNOSIS — E119 Type 2 diabetes mellitus without complications: Secondary | ICD-10-CM | POA: Diagnosis not present

## 2015-06-18 DIAGNOSIS — Z4789 Encounter for other orthopedic aftercare: Secondary | ICD-10-CM | POA: Diagnosis not present

## 2015-06-23 DIAGNOSIS — I10 Essential (primary) hypertension: Secondary | ICD-10-CM | POA: Diagnosis not present

## 2015-06-23 DIAGNOSIS — Z4789 Encounter for other orthopedic aftercare: Secondary | ICD-10-CM | POA: Diagnosis not present

## 2015-06-23 DIAGNOSIS — G4733 Obstructive sleep apnea (adult) (pediatric): Secondary | ICD-10-CM | POA: Diagnosis not present

## 2015-06-23 DIAGNOSIS — E785 Hyperlipidemia, unspecified: Secondary | ICD-10-CM | POA: Diagnosis not present

## 2015-06-23 DIAGNOSIS — E119 Type 2 diabetes mellitus without complications: Secondary | ICD-10-CM | POA: Diagnosis not present

## 2015-06-23 DIAGNOSIS — I2510 Atherosclerotic heart disease of native coronary artery without angina pectoris: Secondary | ICD-10-CM | POA: Diagnosis not present

## 2015-06-29 ENCOUNTER — Other Ambulatory Visit: Payer: Self-pay | Admitting: Interventional Cardiology

## 2015-07-01 DIAGNOSIS — G4733 Obstructive sleep apnea (adult) (pediatric): Secondary | ICD-10-CM | POA: Diagnosis not present

## 2015-07-01 DIAGNOSIS — I10 Essential (primary) hypertension: Secondary | ICD-10-CM | POA: Diagnosis not present

## 2015-07-01 DIAGNOSIS — E785 Hyperlipidemia, unspecified: Secondary | ICD-10-CM | POA: Diagnosis not present

## 2015-07-01 DIAGNOSIS — Z4789 Encounter for other orthopedic aftercare: Secondary | ICD-10-CM | POA: Diagnosis not present

## 2015-07-01 DIAGNOSIS — E119 Type 2 diabetes mellitus without complications: Secondary | ICD-10-CM | POA: Diagnosis not present

## 2015-07-01 DIAGNOSIS — I2510 Atherosclerotic heart disease of native coronary artery without angina pectoris: Secondary | ICD-10-CM | POA: Diagnosis not present

## 2015-07-08 DIAGNOSIS — D649 Anemia, unspecified: Secondary | ICD-10-CM | POA: Diagnosis not present

## 2015-07-08 DIAGNOSIS — R748 Abnormal levels of other serum enzymes: Secondary | ICD-10-CM | POA: Diagnosis not present

## 2015-07-15 DIAGNOSIS — M4722 Other spondylosis with radiculopathy, cervical region: Secondary | ICD-10-CM | POA: Diagnosis not present

## 2015-07-15 DIAGNOSIS — Z6837 Body mass index (BMI) 37.0-37.9, adult: Secondary | ICD-10-CM | POA: Diagnosis not present

## 2015-07-15 DIAGNOSIS — M4806 Spinal stenosis, lumbar region: Secondary | ICD-10-CM | POA: Diagnosis not present

## 2015-07-15 DIAGNOSIS — I10 Essential (primary) hypertension: Secondary | ICD-10-CM | POA: Diagnosis not present

## 2015-07-27 ENCOUNTER — Other Ambulatory Visit: Payer: Self-pay | Admitting: *Deleted

## 2015-07-27 MED ORDER — HYDRALAZINE HCL 25 MG PO TABS: 25.0000 mg | ORAL_TABLET | Freq: Three times a day (TID) | ORAL | Status: DC

## 2015-08-06 DIAGNOSIS — D2262 Melanocytic nevi of left upper limb, including shoulder: Secondary | ICD-10-CM | POA: Diagnosis not present

## 2015-08-06 DIAGNOSIS — L821 Other seborrheic keratosis: Secondary | ICD-10-CM | POA: Diagnosis not present

## 2015-08-06 DIAGNOSIS — D225 Melanocytic nevi of trunk: Secondary | ICD-10-CM | POA: Diagnosis not present

## 2015-08-06 DIAGNOSIS — D2261 Melanocytic nevi of right upper limb, including shoulder: Secondary | ICD-10-CM | POA: Diagnosis not present

## 2015-08-09 ENCOUNTER — Other Ambulatory Visit: Payer: Self-pay | Admitting: Interventional Cardiology

## 2015-08-22 ENCOUNTER — Other Ambulatory Visit: Payer: Self-pay | Admitting: Interventional Cardiology

## 2015-08-27 ENCOUNTER — Other Ambulatory Visit: Payer: Self-pay | Admitting: Gastroenterology

## 2015-08-27 DIAGNOSIS — K644 Residual hemorrhoidal skin tags: Secondary | ICD-10-CM | POA: Diagnosis not present

## 2015-08-27 DIAGNOSIS — Z1211 Encounter for screening for malignant neoplasm of colon: Secondary | ICD-10-CM | POA: Diagnosis not present

## 2015-08-27 DIAGNOSIS — D125 Benign neoplasm of sigmoid colon: Secondary | ICD-10-CM | POA: Diagnosis not present

## 2015-08-27 DIAGNOSIS — K5730 Diverticulosis of large intestine without perforation or abscess without bleeding: Secondary | ICD-10-CM | POA: Diagnosis not present

## 2015-08-27 DIAGNOSIS — D126 Benign neoplasm of colon, unspecified: Secondary | ICD-10-CM | POA: Diagnosis not present

## 2015-08-27 DIAGNOSIS — D123 Benign neoplasm of transverse colon: Secondary | ICD-10-CM | POA: Diagnosis not present

## 2015-08-28 ENCOUNTER — Ambulatory Visit (INDEPENDENT_AMBULATORY_CARE_PROVIDER_SITE_OTHER): Payer: Medicare Other | Admitting: Interventional Cardiology

## 2015-08-28 ENCOUNTER — Encounter: Payer: Self-pay | Admitting: Interventional Cardiology

## 2015-08-28 VITALS — BP 158/68 | HR 60

## 2015-08-28 DIAGNOSIS — E119 Type 2 diabetes mellitus without complications: Secondary | ICD-10-CM | POA: Diagnosis not present

## 2015-08-28 DIAGNOSIS — E785 Hyperlipidemia, unspecified: Secondary | ICD-10-CM

## 2015-08-28 DIAGNOSIS — I10 Essential (primary) hypertension: Secondary | ICD-10-CM | POA: Diagnosis not present

## 2015-08-28 DIAGNOSIS — I2510 Atherosclerotic heart disease of native coronary artery without angina pectoris: Secondary | ICD-10-CM | POA: Diagnosis not present

## 2015-08-28 NOTE — Patient Instructions (Signed)
Medication Instructions:  Your physician recommends that you continue on your current medications as directed. Please refer to the Current Medication list given to you today.  Labwork: NONE  Testing/Procedures: NONE  Follow-Up: Your physician wants you to follow-up in: 1 year with Dr. Tamala Julian. You will receive a reminder letter in the mail two months in advance. If you don't receive a letter, please call our office to schedule the follow-up appointment.  If you need a refill on your cardiac medications before your next appointment, please call your pharmacy.

## 2015-08-28 NOTE — Progress Notes (Signed)
Cardiology Office Note   Date:  08/28/2015   ID:  Kenneth Lee, DOB May 31, 1946, MRN [id number]  PCP:  Kandice Hams, MD  Cardiologist:  Sinclair Grooms, MD   Chief Complaint  Patient presents with  . Coronary Artery Disease      History of Present Illness: Kenneth Lee is a 70 y.o. male who presents for CAD with her mental stent RCA 2008, bilateral carotid disease, sinus node dysfunction, hypertension, hyperlipidemia, and obstructive sleep apnea.  Kenneth Lee is doing well from a cardiac standpoint. He underwent extensive lumbar surgery by Dr. Kristeen Miss lasting greater than 6 hours. He is recuperating from that operation and doing quite well. Has been dramatic improvement in lower back discomfort. He had no cardiac complications during or post the procedure.  He specifically denies chest discomfort, orthopnea, PND, edema, palpitations, and transient neurological complaints.    Past Medical History  Diagnosis Date  . Erectile dysfunction   . Hypertension   . Sleep apnea   . Hyperlipidemia     mixed hyperlidemia  . Goiter   . Other specified cardiac dysrhythmias(427.89)   . Sinoatrial node dysfunction (HCC)   . Carotid disease, bilateral ([place])   . Coronary artery disease     with bare mental stent 2008 and RCA is Card   Dr.Elvina Bosch  . History of aortoiliofemoral vascular bypass     Dr [name]  . Diabetes mellitus without complication [facility])     Past Surgical History  Procedure Laterality Date  . Cholecystectomy    . Aortic bifemural bypass    . Discectomy       Current Outpatient Prescriptions  Medication Sig Dispense Refill  . amLODipine (NORVASC) 5 MG tablet Take 5 mg by mouth daily.  0  . aspirin EC 325 MG tablet Take 325 mg by mouth daily.     . bisacodyl (DULCOLAX) 5 MG EC tablet Take 2 tablets (10 mg total) by mouth daily as needed for moderate constipation. 10 tablet 0  . CRESTOR 10 MG tablet TAKE 1 TABLET BY MOUTH EVERY DAY 30 tablet 11  .  furosemide (LASIX) 40 MG tablet TAKE 1 TABLET BY MOUTH ONCE DAILY 30 tablet 0  . hydrALAZINE (APRESOLINE) 25 MG tablet Take 1 tablet (25 mg total) by mouth 3 (three) times daily. 270 tablet 0  . lisinopril (PRINIVIL,ZESTRIL) 20 MG tablet TAKE 1 TABLET (20 MG TOTAL) BY MOUTH DAILY. 90 tablet 0  . meloxicam (MOBIC) 15 MG tablet Take 15 mg by mouth daily.  7  . metFORMIN (GLUCOPHAGE) 500 MG tablet Take 500 mg by mouth 2 (two) times daily with a meal.     . nebivolol (BYSTOLIC) 5 MG tablet Take 1 tablet (5 mg total) by mouth daily. 30 tablet 1  . nitroGLYCERIN (NITROSTAT) 0.4 MG SL tablet Place 0.4 mg under the tongue every 5 (five) minutes as needed for chest pain.     No current facility-administered medications for this visit.    Allergies:   Penicillins    Social History:  The patient  reports that he quit smoking about 11 years ago. He does not have any smokeless tobacco history on file. He reports that he drinks alcohol. He reports that he does not use illicit drugs.   Family History:  The patient's family history includes Cancer in his mother; Heart attack in his mother; Heart disease in his mother; Hyperlipidemia in his other; Hypertension in his father, mother, and other. There is no history of  Stroke.    ROS:  Please see the history of present illness.   Otherwise, review of systems are positive for back pain, snoring, coated with balance and lower extremity weakness..   All other systems are reviewed and negative.    PHYSICAL EXAM: VS:  BP 158/68 mmHg  Pulse 60 , BMI There is no weight on file to calculate BMI. GEN: Well nourished, well developed, in no acute distress HEENT: normal Neck: no JVD, carotid bruits, or masses Cardiac: RRR.  There is no murmur, rub, or gallop. There is no edema. Respiratory:  clear to auscultation bilaterally, normal work of breathing. GI: soft, nontender, nondistended, + BS MS: no deformity or atrophy Skin: warm and dry, no rash Neuro:  Strength  and sensation are intact Psych: euthymic mood, full affect   EKG:  EKG is not ordered today. The ekg on 05/09/2015 reveals sinus bradycardia but otherwise normal.   Recent Labs: 05/08/2015: TSH 2.066 05/09/2015: ALT 25; B Natriuretic Peptide 24.9 05/13/2015: BUN 26*; Creatinine, Ser 0.89; Hemoglobin 11.3*; Platelets 126*; Potassium 3.7; Sodium 135    Lipid Panel    Component Value Date/Time   CHOL  03/29/2007 0015    194        ATP III CLASSIFICATION:  <200     mg/dL   Desirable  200-239  mg/dL   Borderline High  >=240    mg/dL   High   TRIG 140 03/29/2007 0015   HDL 39* 03/29/2007 0015   CHOLHDL 5.0 03/29/2007 0015   VLDL 28 03/29/2007 0015   LDLCALC * 03/29/2007 0015    127        Total Cholesterol/HDL:CHD Risk Coronary Heart Disease Risk Table                     Men   Women  1/2 Average Risk   3.4   3.3      Wt Readings from Last 3 Encounters:  05/14/15 263 lb 7.2 oz (119.5 kg)  07/23/14 281 lb (127.461 kg)  04/05/14 282 lb (127.914 kg)      Other studies Reviewed: Additional studies/ records that were reviewed today include: Hospital records during most recent lumbar spine surgery.. The findings include no cardiac complications..    ASSESSMENT AND PLAN:  1. Coronary artery disease involving native coronary artery of native heart without angina pectoris Asymptomatic  2. Hyperlipidemia with target LDL less than 70 Followed by primary care  3. Essential hypertension Mildly elevated today but according to the patient significantly higher than almost all recent evaluations both in the hospital and at rehabilitation. Therefore there is probably a situational component today.  4. Type 2 diabetes mellitus not at goal [facility]) Followed by primary care    Current medicines are reviewed at length with the patient today.  The patient has the following concerns regarding medicines: None although I do note that Bystolic is now 5 mg per day instead of 10 mg. We may  need to resume the higher dose. .  The following changes/actions have been instituted:    Low salt diet  Monitor blood pressure  Labs/ tests ordered today include:  No orders of the defined types were placed in this encounter.     Disposition:   FU with HS in 1 year  Signed, Sinclair Grooms, MD  08/28/2015 9:24 AM    [address], [place], [place]  [address] Phone: [phone]; Fax: [phone]

## 2015-08-30 ENCOUNTER — Other Ambulatory Visit: Payer: Self-pay | Admitting: Interventional Cardiology

## 2015-09-30 ENCOUNTER — Other Ambulatory Visit: Payer: Self-pay | Admitting: *Deleted

## 2015-09-30 MED ORDER — FUROSEMIDE 40 MG PO TABS: 40.0000 mg | ORAL_TABLET | Freq: Every day | ORAL | Status: DC

## 2015-10-03 ENCOUNTER — Other Ambulatory Visit: Payer: Self-pay | Admitting: Interventional Cardiology

## 2015-10-13 ENCOUNTER — Other Ambulatory Visit: Payer: Self-pay | Admitting: Interventional Cardiology

## 2015-10-22 DIAGNOSIS — Z6837 Body mass index (BMI) 37.0-37.9, adult: Secondary | ICD-10-CM | POA: Diagnosis not present

## 2015-10-22 DIAGNOSIS — I10 Essential (primary) hypertension: Secondary | ICD-10-CM | POA: Diagnosis not present

## 2015-10-22 DIAGNOSIS — M4722 Other spondylosis with radiculopathy, cervical region: Secondary | ICD-10-CM | POA: Diagnosis not present

## 2015-10-26 DIAGNOSIS — E785 Hyperlipidemia, unspecified: Secondary | ICD-10-CM | POA: Diagnosis not present

## 2015-10-26 DIAGNOSIS — E1165 Type 2 diabetes mellitus with hyperglycemia: Secondary | ICD-10-CM | POA: Diagnosis not present

## 2015-10-26 DIAGNOSIS — E7800 Pure hypercholesterolemia, unspecified: Secondary | ICD-10-CM | POA: Diagnosis not present

## 2015-10-26 DIAGNOSIS — G4730 Sleep apnea, unspecified: Secondary | ICD-10-CM | POA: Diagnosis not present

## 2015-10-26 DIAGNOSIS — I10 Essential (primary) hypertension: Secondary | ICD-10-CM | POA: Diagnosis not present

## 2015-12-03 ENCOUNTER — Other Ambulatory Visit: Payer: Self-pay | Admitting: Interventional Cardiology

## 2015-12-04 ENCOUNTER — Other Ambulatory Visit: Payer: Self-pay | Admitting: Interventional Cardiology

## 2015-12-09 DIAGNOSIS — Z6837 Body mass index (BMI) 37.0-37.9, adult: Secondary | ICD-10-CM | POA: Diagnosis not present

## 2015-12-09 DIAGNOSIS — I10 Essential (primary) hypertension: Secondary | ICD-10-CM | POA: Diagnosis not present

## 2015-12-09 DIAGNOSIS — M4806 Spinal stenosis, lumbar region: Secondary | ICD-10-CM | POA: Diagnosis not present

## 2015-12-10 ENCOUNTER — Ambulatory Visit
Admission: RE | Admit: 2015-12-10 | Discharge: 2015-12-10 | Disposition: A | Discharge status: Home / Self Care | Payer: Medicare Other | Source: Ambulatory Visit | Attending: Neurological Surgery | Admitting: Neurological Surgery

## 2015-12-10 ENCOUNTER — Other Ambulatory Visit: Payer: Self-pay | Admitting: Neurological Surgery

## 2015-12-10 DIAGNOSIS — M48061 Spinal stenosis, lumbar region without neurogenic claudication: Secondary | ICD-10-CM

## 2015-12-10 DIAGNOSIS — M545 Low back pain: Secondary | ICD-10-CM | POA: Diagnosis not present

## 2015-12-10 MED ORDER — GADOBENATE DIMEGLUMINE 529 MG/ML IV SOLN: 20.0000 mL | Freq: Once | INTRAVENOUS | Status: DC | PRN

## 2015-12-16 DIAGNOSIS — I10 Essential (primary) hypertension: Secondary | ICD-10-CM | POA: Diagnosis not present

## 2015-12-16 DIAGNOSIS — Z6837 Body mass index (BMI) 37.0-37.9, adult: Secondary | ICD-10-CM | POA: Diagnosis not present

## 2015-12-16 DIAGNOSIS — M4806 Spinal stenosis, lumbar region: Secondary | ICD-10-CM | POA: Diagnosis not present

## 2015-12-18 ENCOUNTER — Other Ambulatory Visit: Payer: Self-pay | Admitting: Neurological Surgery

## 2015-12-29 NOTE — Pre-Procedure Instructions (Signed)
Kenneth Lee  12/29/2015      [id number] - [place], [place] [address] AT [address] [place] [address] Phone: [phone] Fax: [phone]    Your procedure is scheduled on Tues, June 27 @ 11:00 AM  Report to [facility] Admitting at 8:00 AM  Call this number if you have problems the morning of surgery:  [phone]   Remember:  Do not eat food or drink liquids after midnight.  Take these medicines the morning of surgery with A SIP OF WATER Amlodipine(Norvasc),Bystolic(Nebivolol),Hydralazine(Apresoline),and Pain Pill(if needed)               Stop taking your Multivitamin,Fish Oil,Mobic,and Aspirin. No Goody's,BC's,Aleve,Advil,Motrin,Ibuprofen,or any Herbal Medications.     How to Manage Your Diabetes Before and After Surgery  Why is it important to control my blood sugar before and after surgery? . Improving blood sugar levels before and after surgery helps healing and can limit problems. . A way of improving blood sugar control is eating a healthy diet by: o  Eating less sugar and carbohydrates o  Increasing activity/exercise o  Talking with your doctor about reaching your blood sugar goals . High blood sugars (greater than 180 mg/dL) can raise your risk of infections and slow your recovery, so you will need to focus on controlling your diabetes during the weeks before surgery. . Make sure that the doctor who takes care of your diabetes knows about your planned surgery including the date and location.  How do I manage my blood sugar before surgery? . Check your blood sugar at least 4 times a day, starting 2 days before surgery, to make sure that the level is not too high or low. o Check your blood sugar the morning of your surgery when you wake up and every 2 hours until you get to the Short Stay unit. . If your blood sugar is less than 70 mg/dL, you will need to treat for low blood  sugar: o Do not take insulin. o Treat a low blood sugar (less than 70 mg/dL) with  cup of clear juice (cranberry or apple), 4 glucose tablets, OR glucose gel. o Recheck blood sugar in 15 minutes after treatment (to make sure it is greater than 70 mg/dL). If your blood sugar is not greater than 70 mg/dL on recheck, call [phone] for further instructions. . Report your blood sugar to the short stay nurse when you get to Short Stay.  . If you are admitted to the hospital after surgery: o Your blood sugar will be checked by the staff and you will probably be given insulin after surgery (instead of oral diabetes medicines) to make sure you have good blood sugar levels. o The goal for blood sugar control after surgery is 80-180 mg/dL.              WHAT DO I DO ABOUT MY DIABETES MEDICATION?   Marland Kitchen Do not take oral diabetes medicines (pills) the morning of surgery.  .       .   . The day of surgery, do not take other diabetes injectables, including Byetta (exenatide), Bydureon (exenatide ER), Victoza (liraglutide), or Trulicity (dulaglutide).  . If your CBG is greater than 220 mg/dL, you may take  of your sliding scale (correction) dose of insulin.  Other Instructions:          Patient Signature:  Date:   Nurse Signature:  Date:  Reviewed and Endorsed by [facility] Patient Education Committee, August 2015   Do not wear jewelry.  Do not wear lotions, powders, or colognes.               Men may shave face and neck.  Do not bring valuables to the hospital.  [facility] is not responsible for any belongings or valuables.  Contacts, dentures or bridgework may not be worn into surgery.  Leave your suitcase in the car.  After surgery it may be brought to your room.  For patients admitted to the hospital, discharge time will be determined by your treatment team.  Patients discharged the day of surgery will not be allowed to drive home.    Special instructions:  [facility] - Preparing for Surgery  Before surgery, you can play an important role.  Because skin is not sterile, your skin needs to be as free of germs as possible.  You can reduce the number of germs on you skin by washing with CHG (chlorahexidine gluconate) soap before surgery.  CHG is an antiseptic cleaner which kills germs and bonds with the skin to continue killing germs even after washing.  Please DO NOT use if you have an allergy to CHG or antibacterial soaps.  If your skin becomes reddened/irritated stop using the CHG and inform your nurse when you arrive at Short Stay.  Do not shave (including legs and underarms) for at least 48 hours prior to the first CHG shower.  You may shave your face.  Please follow these instructions carefully:   1.  Shower with CHG Soap the night before surgery and the                                morning of Surgery.  2.  If you choose to wash your hair, wash your hair first as usual with your       normal shampoo.  3.  After you shampoo, rinse your hair and body thoroughly to remove the                      Shampoo.  4.  Use CHG as you would any other liquid soap.  You can apply chg directly       to the skin and wash gently with scrungie or a clean washcloth.  5.  Apply the CHG Soap to your body ONLY FROM THE NECK DOWN.        Do not use on open wounds or open sores.  Avoid contact with your eyes,       ears, mouth and genitals (private parts).  Wash genitals (private parts)       with your normal soap.  6.  Wash thoroughly, paying special attention to the area where your surgery        will be performed.  7.  Thoroughly rinse your body with warm water from the neck down.  8.  DO NOT shower/wash with your normal soap after using and rinsing off       the CHG Soap.  9.  Pat yourself dry with a clean towel.            10.  Wear clean pajamas.            11.  Place clean sheets on your bed the night of your first shower and do not  sleep with pets.  Day of  Surgery  Do not apply any lotions/deoderants the morning of surgery.  Please wear clean clothes to the hospital/surgery center.    Please read over the following fact sheets that you were given. Pain Booklet, Coughing and Deep Breathing and MRSA Information

## 2015-12-30 ENCOUNTER — Encounter (HOSPITAL_COMMUNITY)
Admission: RE | Admit: 2015-12-30 | Discharge: 2015-12-30 | Disposition: A | Discharge status: Home / Self Care | Payer: Medicare Other | Source: Ambulatory Visit | Attending: Neurological Surgery | Admitting: Neurological Surgery

## 2015-12-30 ENCOUNTER — Encounter (HOSPITAL_COMMUNITY): Payer: Self-pay

## 2015-12-30 DIAGNOSIS — Z01812 Encounter for preprocedural laboratory examination: Secondary | ICD-10-CM | POA: Insufficient documentation

## 2015-12-30 DIAGNOSIS — M4806 Spinal stenosis, lumbar region: Secondary | ICD-10-CM | POA: Insufficient documentation

## 2015-12-30 DIAGNOSIS — Z0183 Encounter for blood typing: Secondary | ICD-10-CM | POA: Diagnosis not present

## 2015-12-30 HISTORY — DX: Personal history of colon polyps, unspecified: Z86.0100

## 2015-12-30 HISTORY — DX: Weakness: R53.1

## 2015-12-30 HISTORY — DX: Other chronic pain: G89.29

## 2015-12-30 HISTORY — DX: Headache: R51

## 2015-12-30 HISTORY — DX: Unspecified osteoarthritis, unspecified site: M19.90

## 2015-12-30 HISTORY — DX: Polyneuropathy, unspecified: G62.9

## 2015-12-30 HISTORY — DX: Dorsalgia, unspecified: M54.9

## 2015-12-30 HISTORY — DX: Personal history of colonic polyps: Z86.010

## 2015-12-30 HISTORY — DX: Pain in unspecified joint: M25.50

## 2015-12-30 HISTORY — DX: Localized edema: R60.0

## 2015-12-30 HISTORY — DX: Edema, unspecified: R60.9

## 2015-12-30 LAB — BASIC METABOLIC PANEL
Anion gap: 8 (ref 5–15)
BUN: 12 mg/dL (ref 6–20)
CO2: 23 mmol/L (ref 22–32)
Calcium: 9.4 mg/dL (ref 8.9–10.3)
Chloride: 107 mmol/L (ref 101–111)
Creatinine, Ser: 0.71 mg/dL (ref 0.61–1.24)
GFR calc Af Amer: >60 mL/min (ref >60)
GFR calc non Af Amer: >60 mL/min (ref >60)
Glucose, Bld: 146 mg/dL — ABNORMAL HIGH (ref 65–99)
Potassium: 4 mmol/L (ref 3.5–5.1)
Sodium: 138 mmol/L (ref 135–145)

## 2015-12-30 LAB — TYPE AND SCREEN
ABO/RH(D): A NEG
Antibody Screen: NEGATIVE

## 2015-12-30 LAB — CBC
HCT: 44.1 % (ref 39.0–52.0)
Hemoglobin: 14.7 g/dL (ref 13.0–17.0)
MCH: 31.9 pg (ref 26.0–34.0)
MCHC: 33.3 g/dL (ref 30.0–36.0)
MCV: 95.7 fL (ref 78.0–100.0)
Platelets: 164 10*3/uL (ref 150–400)
RBC: 4.61 MIL/uL (ref 4.22–5.81)
RDW: 13.1 % (ref 11.5–15.5)
WBC: 8.9 10*3/uL (ref 4.0–10.5)

## 2015-12-30 LAB — GLUCOSE, CAPILLARY: Glucose-Capillary: 136 mg/dL — ABNORMAL HIGH (ref 65–99)

## 2015-12-30 LAB — SURGICAL PCR SCREEN
MRSA, PCR: NEGATIVE
Staphylococcus aureus: NEGATIVE

## 2015-12-30 LAB — ABO/RH: ABO/RH(D): A NEG

## 2015-12-30 NOTE — Progress Notes (Addendum)
Cardiologist is Dr.Henry Smith,last visit in epic from 08-28-15  Medical Md is Dr.Ronald Polite  Echo report in epic from 2016  Stress test done 10+ yrs ago  Heart cath 2008  EKG in epic from 05-09-15  CXR denies in past yr

## 2015-12-31 LAB — HEMOGLOBIN A1C
Hgb A1c MFr Bld: 6.2 % — ABNORMAL HIGH (ref 4.8–5.6)
Mean Plasma Glucose: 131 mg/dL

## 2016-01-04 MED ORDER — VANCOMYCIN HCL 10 G IV SOLR
1500.0000 mg | INTRAVENOUS | Status: AC
  Administered 2016-01-05: 1500 mg via INTRAVENOUS
  Filled 2016-01-04: qty 1500

## 2016-01-05 ENCOUNTER — Inpatient Hospital Stay (HOSPITAL_COMMUNITY)
Admission: RE | Admit: 2016-01-05 | Discharge: 2016-01-09 | DRG: 460 | Disposition: A | Discharge status: Home Health | Payer: Medicare Other | Source: Ambulatory Visit | Attending: Neurological Surgery | Admitting: Neurological Surgery

## 2016-01-05 ENCOUNTER — Encounter (HOSPITAL_COMMUNITY): Payer: Self-pay | Admitting: *Deleted

## 2016-01-05 ENCOUNTER — Encounter (HOSPITAL_COMMUNITY)
Admission: RE | Disposition: A | Discharge status: Home Health | Payer: Self-pay | Source: Ambulatory Visit | Attending: Neurological Surgery

## 2016-01-05 ENCOUNTER — Inpatient Hospital Stay (HOSPITAL_COMMUNITY): Payer: Medicare Other

## 2016-01-05 ENCOUNTER — Inpatient Hospital Stay (HOSPITAL_COMMUNITY): Payer: Medicare Other | Admitting: Anesthesiology

## 2016-01-05 DIAGNOSIS — M48061 Spinal stenosis, lumbar region without neurogenic claudication: Secondary | ICD-10-CM | POA: Diagnosis present

## 2016-01-05 DIAGNOSIS — M4806 Spinal stenosis, lumbar region: Principal | ICD-10-CM | POA: Diagnosis present

## 2016-01-05 DIAGNOSIS — Z6838 Body mass index (BMI) 38.0-38.9, adult: Secondary | ICD-10-CM

## 2016-01-05 DIAGNOSIS — Z7984 Long term (current) use of oral hypoglycemic drugs: Secondary | ICD-10-CM | POA: Diagnosis not present

## 2016-01-05 DIAGNOSIS — Z9049 Acquired absence of other specified parts of digestive tract: Secondary | ICD-10-CM | POA: Diagnosis not present

## 2016-01-05 DIAGNOSIS — M4726 Other spondylosis with radiculopathy, lumbar region: Secondary | ICD-10-CM | POA: Diagnosis present

## 2016-01-05 DIAGNOSIS — Z79899 Other long term (current) drug therapy: Secondary | ICD-10-CM

## 2016-01-05 DIAGNOSIS — Z7982 Long term (current) use of aspirin: Secondary | ICD-10-CM | POA: Diagnosis not present

## 2016-01-05 DIAGNOSIS — Z419 Encounter for procedure for purposes other than remedying health state, unspecified: Secondary | ICD-10-CM

## 2016-01-05 DIAGNOSIS — M5416 Radiculopathy, lumbar region: Secondary | ICD-10-CM | POA: Diagnosis not present

## 2016-01-05 DIAGNOSIS — I10 Essential (primary) hypertension: Secondary | ICD-10-CM | POA: Diagnosis present

## 2016-01-05 DIAGNOSIS — E785 Hyperlipidemia, unspecified: Secondary | ICD-10-CM | POA: Diagnosis present

## 2016-01-05 DIAGNOSIS — I495 Sick sinus syndrome: Secondary | ICD-10-CM | POA: Diagnosis present

## 2016-01-05 DIAGNOSIS — E1142 Type 2 diabetes mellitus with diabetic polyneuropathy: Secondary | ICD-10-CM | POA: Diagnosis present

## 2016-01-05 DIAGNOSIS — M549 Dorsalgia, unspecified: Secondary | ICD-10-CM | POA: Diagnosis present

## 2016-01-05 DIAGNOSIS — G4730 Sleep apnea, unspecified: Secondary | ICD-10-CM | POA: Diagnosis present

## 2016-01-05 DIAGNOSIS — Z981 Arthrodesis status: Secondary | ICD-10-CM

## 2016-01-05 DIAGNOSIS — Z880 Allergy status to penicillin: Secondary | ICD-10-CM

## 2016-01-05 DIAGNOSIS — Z87891 Personal history of nicotine dependence: Secondary | ICD-10-CM | POA: Diagnosis not present

## 2016-01-05 DIAGNOSIS — I2510 Atherosclerotic heart disease of native coronary artery without angina pectoris: Secondary | ICD-10-CM | POA: Diagnosis present

## 2016-01-05 DIAGNOSIS — M1990 Unspecified osteoarthritis, unspecified site: Secondary | ICD-10-CM | POA: Diagnosis not present

## 2016-01-05 DIAGNOSIS — Z955 Presence of coronary angioplasty implant and graft: Secondary | ICD-10-CM | POA: Diagnosis not present

## 2016-01-05 DIAGNOSIS — E669 Obesity, unspecified: Secondary | ICD-10-CM | POA: Diagnosis present

## 2016-01-05 LAB — GLUCOSE, CAPILLARY
Glucose-Capillary: 140 mg/dL — ABNORMAL HIGH (ref 65–99)
Glucose-Capillary: 131 mg/dL — ABNORMAL HIGH (ref 65–99)
Glucose-Capillary: 144 mg/dL — ABNORMAL HIGH (ref 65–99)

## 2016-01-05 SURGERY — POSTERIOR LUMBAR FUSION 1 LEVEL
Anesthesia: General | Site: Spine Lumbar

## 2016-01-05 MED ORDER — ROSUVASTATIN CALCIUM 5 MG PO TABS
10.0000 mg | ORAL_TABLET | Freq: Every day | ORAL | Status: DC
  Administered 2016-01-05 – 2016-01-08 (×4): 10 mg via ORAL
  Filled 2016-01-05 (×4): qty 2

## 2016-01-05 MED ORDER — ARTIFICIAL TEARS OP OINT
TOPICAL_OINTMENT | OPHTHALMIC | Status: AC
  Filled 2016-01-05: qty 3.5

## 2016-01-05 MED ORDER — BUPIVACAINE HCL (PF) 0.5 % IJ SOLN
INTRAMUSCULAR | Status: DC | PRN
  Administered 2016-01-05: 5 mL
  Administered 2016-01-05: 25 mL

## 2016-01-05 MED ORDER — PROMETHAZINE HCL 25 MG/ML IJ SOLN: 6.2500 mg | INTRAMUSCULAR | Status: DC | PRN

## 2016-01-05 MED ORDER — SODIUM CHLORIDE 0.9% FLUSH
3.0000 mL | Freq: Two times a day (BID) | INTRAVENOUS | Status: DC
  Administered 2016-01-05 – 2016-01-08 (×5): 3 mL via INTRAVENOUS

## 2016-01-05 MED ORDER — MENTHOL 3 MG MT LOZG: 1.0000 | LOZENGE | OROMUCOSAL | Status: DC | PRN

## 2016-01-05 MED ORDER — ARTIFICIAL TEARS OP OINT
TOPICAL_OINTMENT | OPHTHALMIC | Status: DC | PRN
  Administered 2016-01-05: 1 via OPHTHALMIC

## 2016-01-05 MED ORDER — METHOCARBAMOL 500 MG PO TABS
500.0000 mg | ORAL_TABLET | Freq: Four times a day (QID) | ORAL | Status: DC | PRN
  Administered 2016-01-05 – 2016-01-08 (×6): 500 mg via ORAL
  Filled 2016-01-05 (×7): qty 1

## 2016-01-05 MED ORDER — HYDROMORPHONE HCL 1 MG/ML IJ SOLN
0.2500 mg | INTRAMUSCULAR | Status: DC | PRN
  Administered 2016-01-05 (×2): 0.5 mg via INTRAVENOUS

## 2016-01-05 MED ORDER — ONDANSETRON HCL 4 MG/2ML IJ SOLN: 4.0000 mg | INTRAMUSCULAR | Status: DC | PRN

## 2016-01-05 MED ORDER — FENTANYL CITRATE (PF) 250 MCG/5ML IJ SOLN
INTRAMUSCULAR | Status: AC
  Filled 2016-01-05: qty 5

## 2016-01-05 MED ORDER — MIDAZOLAM HCL 2 MG/2ML IJ SOLN: 0.5000 mg | Freq: Once | INTRAMUSCULAR | Status: DC | PRN

## 2016-01-05 MED ORDER — METHOCARBAMOL 1000 MG/10ML IJ SOLN: 500.0000 mg | Freq: Four times a day (QID) | INTRAVENOUS | Status: DC | PRN

## 2016-01-05 MED ORDER — SUCCINYLCHOLINE CHLORIDE 200 MG/10ML IV SOSY
PREFILLED_SYRINGE | INTRAVENOUS | Status: AC
  Filled 2016-01-05: qty 10

## 2016-01-05 MED ORDER — LISINOPRIL 20 MG PO TABS
20.0000 mg | ORAL_TABLET | Freq: Every day | ORAL | Status: DC
  Administered 2016-01-07 – 2016-01-08 (×2): 20 mg via ORAL
  Filled 2016-01-05 (×3): qty 1

## 2016-01-05 MED ORDER — HYDROCODONE-ACETAMINOPHEN 10-325 MG PO TABS
1.0000 | ORAL_TABLET | Freq: Four times a day (QID) | ORAL | Status: DC | PRN
  Administered 2016-01-06 – 2016-01-07 (×4): 2 via ORAL
  Filled 2016-01-05 (×4): qty 2

## 2016-01-05 MED ORDER — SENNA 8.6 MG PO TABS
1.0000 | ORAL_TABLET | Freq: Two times a day (BID) | ORAL | Status: DC
  Administered 2016-01-05 – 2016-01-08 (×7): 8.6 mg via ORAL
  Filled 2016-01-05 (×7): qty 1

## 2016-01-05 MED ORDER — SODIUM CHLORIDE 0.9 % IV SOLN
INTRAVENOUS | Status: DC
  Administered 2016-01-05 – 2016-01-06 (×2): via INTRAVENOUS

## 2016-01-05 MED ORDER — THROMBIN 5000 UNITS EX SOLR
OROMUCOSAL | Status: DC | PRN
  Administered 2016-01-05: 14:00:00 via TOPICAL

## 2016-01-05 MED ORDER — LIDOCAINE HCL (CARDIAC) 20 MG/ML IV SOLN
INTRAVENOUS | Status: DC | PRN
  Administered 2016-01-05: 20 mg via INTRAVENOUS

## 2016-01-05 MED ORDER — HYDROMORPHONE HCL 1 MG/ML IJ SOLN
INTRAMUSCULAR | Status: AC
  Filled 2016-01-05: qty 1

## 2016-01-05 MED ORDER — KETOROLAC TROMETHAMINE 15 MG/ML IJ SOLN
15.0000 mg | Freq: Four times a day (QID) | INTRAMUSCULAR | Status: AC
  Administered 2016-01-05 – 2016-01-06 (×5): 15 mg via INTRAVENOUS
  Filled 2016-01-05 (×5): qty 1

## 2016-01-05 MED ORDER — EPHEDRINE SULFATE 50 MG/ML IJ SOLN
INTRAMUSCULAR | Status: DC | PRN
  Administered 2016-01-05: 10 mg via INTRAVENOUS

## 2016-01-05 MED ORDER — MIDAZOLAM HCL 5 MG/5ML IJ SOLN
INTRAMUSCULAR | Status: DC | PRN
  Administered 2016-01-05: 2 mg via INTRAVENOUS

## 2016-01-05 MED ORDER — LIDOCAINE-EPINEPHRINE 1 %-1:100000 IJ SOLN
INTRAMUSCULAR | Status: DC | PRN
  Administered 2016-01-05: 5 mL

## 2016-01-05 MED ORDER — HYDROCODONE-ACETAMINOPHEN 5-325 MG PO TABS: 1.0000 | ORAL_TABLET | ORAL | Status: DC | PRN

## 2016-01-05 MED ORDER — NEBIVOLOL HCL 5 MG PO TABS
5.0000 mg | ORAL_TABLET | Freq: Every day | ORAL | Status: DC
  Administered 2016-01-07 – 2016-01-08 (×2): 5 mg via ORAL
  Filled 2016-01-05 (×4): qty 1

## 2016-01-05 MED ORDER — 0.9 % SODIUM CHLORIDE (POUR BTL) OPTIME
TOPICAL | Status: DC | PRN
  Administered 2016-01-05: 1000 mL

## 2016-01-05 MED ORDER — PHENOL 1.4 % MT LIQD: 1.0000 | OROMUCOSAL | Status: DC | PRN

## 2016-01-05 MED ORDER — LIDOCAINE 2% (20 MG/ML) 5 ML SYRINGE
INTRAMUSCULAR | Status: AC
  Filled 2016-01-05: qty 5

## 2016-01-05 MED ORDER — ALUM & MAG HYDROXIDE-SIMETH 200-200-20 MG/5ML PO SUSP: 30.0000 mL | Freq: Four times a day (QID) | ORAL | Status: DC | PRN

## 2016-01-05 MED ORDER — MAGNESIUM CITRATE PO SOLN: 1.0000 | Freq: Once | ORAL | Status: DC | PRN

## 2016-01-05 MED ORDER — OXYCODONE-ACETAMINOPHEN 5-325 MG PO TABS
1.0000 | ORAL_TABLET | ORAL | Status: DC | PRN
  Administered 2016-01-05: 2 via ORAL
  Administered 2016-01-06: 1 via ORAL
  Administered 2016-01-06 – 2016-01-09 (×6): 2 via ORAL
  Filled 2016-01-05: qty 1
  Filled 2016-01-05 (×7): qty 2

## 2016-01-05 MED ORDER — POLYETHYLENE GLYCOL 3350 17 G PO PACK: 17.0000 g | PACK | Freq: Every day | ORAL | Status: DC | PRN

## 2016-01-05 MED ORDER — GLYCOPYRROLATE 0.2 MG/ML IJ SOLN
INTRAMUSCULAR | Status: DC | PRN
  Administered 2016-01-05: 0.2 mg via INTRAVENOUS
  Administered 2016-01-05: 0.4 mg via INTRAVENOUS
  Administered 2016-01-05: 0.2 mg via INTRAVENOUS

## 2016-01-05 MED ORDER — NEOSTIGMINE METHYLSULFATE 10 MG/10ML IV SOLN
INTRAVENOUS | Status: DC | PRN
  Administered 2016-01-05: 3 mg via INTRAVENOUS

## 2016-01-05 MED ORDER — ONDANSETRON HCL 4 MG/2ML IJ SOLN
INTRAMUSCULAR | Status: AC
  Filled 2016-01-05: qty 2

## 2016-01-05 MED ORDER — SODIUM CHLORIDE 0.9 % IV SOLN: 250.0000 mL | INTRAVENOUS | Status: DC

## 2016-01-05 MED ORDER — SODIUM CHLORIDE 0.9 % IR SOLN
Status: DC | PRN
  Administered 2016-01-05: 500 mL

## 2016-01-05 MED ORDER — AMLODIPINE BESYLATE 5 MG PO TABS
5.0000 mg | ORAL_TABLET | Freq: Every day | ORAL | Status: DC
  Administered 2016-01-07 – 2016-01-08 (×2): 5 mg via ORAL
  Filled 2016-01-05 (×3): qty 1

## 2016-01-05 MED ORDER — MIDAZOLAM HCL 2 MG/2ML IJ SOLN
INTRAMUSCULAR | Status: AC
  Filled 2016-01-05: qty 2

## 2016-01-05 MED ORDER — VANCOMYCIN HCL IN DEXTROSE 1-5 GM/200ML-% IV SOLN
1000.0000 mg | Freq: Once | INTRAVENOUS | Status: AC
  Administered 2016-01-06: 1000 mg via INTRAVENOUS
  Filled 2016-01-05: qty 200

## 2016-01-05 MED ORDER — SODIUM CHLORIDE 0.9% FLUSH: 3.0000 mL | INTRAVENOUS | Status: DC | PRN

## 2016-01-05 MED ORDER — PROPOFOL 10 MG/ML IV BOLUS
INTRAVENOUS | Status: DC | PRN
  Administered 2016-01-05: 150 mg via INTRAVENOUS

## 2016-01-05 MED ORDER — NITROGLYCERIN 0.4 MG SL SUBL: 0.4000 mg | SUBLINGUAL_TABLET | SUBLINGUAL | Status: DC | PRN

## 2016-01-05 MED ORDER — HYDRALAZINE HCL 25 MG PO TABS
25.0000 mg | ORAL_TABLET | Freq: Three times a day (TID) | ORAL | Status: DC
  Administered 2016-01-05 – 2016-01-08 (×8): 25 mg via ORAL
  Filled 2016-01-05 (×9): qty 1

## 2016-01-05 MED ORDER — LACTATED RINGERS IV SOLN
INTRAVENOUS | Status: DC
  Administered 2016-01-05 (×3): via INTRAVENOUS

## 2016-01-05 MED ORDER — DOCUSATE SODIUM 100 MG PO CAPS
100.0000 mg | ORAL_CAPSULE | Freq: Two times a day (BID) | ORAL | Status: DC
  Administered 2016-01-05 – 2016-01-08 (×7): 100 mg via ORAL
  Filled 2016-01-05 (×7): qty 1

## 2016-01-05 MED ORDER — ONDANSETRON HCL 4 MG/2ML IJ SOLN
INTRAMUSCULAR | Status: DC | PRN
  Administered 2016-01-05: 4 mg via INTRAVENOUS

## 2016-01-05 MED ORDER — ACETAMINOPHEN 325 MG PO TABS
650.0000 mg | ORAL_TABLET | ORAL | Status: DC | PRN
Start: 2016-01-05 — End: 2016-01-09

## 2016-01-05 MED ORDER — ACETAMINOPHEN 650 MG RE SUPP: 650.0000 mg | RECTAL | Status: DC | PRN

## 2016-01-05 MED ORDER — FENTANYL CITRATE (PF) 100 MCG/2ML IJ SOLN
INTRAMUSCULAR | Status: DC | PRN
  Administered 2016-01-05 (×6): 50 ug via INTRAVENOUS
  Administered 2016-01-05: 200 ug via INTRAVENOUS

## 2016-01-05 MED ORDER — MEPERIDINE HCL 25 MG/ML IJ SOLN: 6.2500 mg | INTRAMUSCULAR | Status: DC | PRN

## 2016-01-05 MED ORDER — ROCURONIUM BROMIDE 100 MG/10ML IV SOLN
INTRAVENOUS | Status: DC | PRN
  Administered 2016-01-05: 50 mg via INTRAVENOUS

## 2016-01-05 MED ORDER — PROPOFOL 10 MG/ML IV BOLUS
INTRAVENOUS | Status: AC
  Filled 2016-01-05: qty 20

## 2016-01-05 MED ORDER — METFORMIN HCL 500 MG PO TABS
500.0000 mg | ORAL_TABLET | Freq: Every day | ORAL | Status: DC
  Administered 2016-01-06 – 2016-01-09 (×4): 500 mg via ORAL
  Filled 2016-01-05 (×4): qty 1

## 2016-01-05 MED ORDER — MORPHINE SULFATE (PF) 2 MG/ML IV SOLN: 1.0000 mg | INTRAVENOUS | Status: DC | PRN

## 2016-01-05 MED ORDER — THROMBIN 20000 UNITS EX SOLR
CUTANEOUS | Status: DC | PRN
  Administered 2016-01-05: 14:00:00 via TOPICAL

## 2016-01-05 MED ORDER — BISACODYL 10 MG RE SUPP: 10.0000 mg | Freq: Every day | RECTAL | Status: DC | PRN

## 2016-01-05 MED ORDER — FUROSEMIDE 40 MG PO TABS
40.0000 mg | ORAL_TABLET | Freq: Every day | ORAL | Status: DC
  Administered 2016-01-06 – 2016-01-08 (×3): 40 mg via ORAL
  Filled 2016-01-05 (×3): qty 1

## 2016-01-05 MED FILL — Heparin Sodium (Porcine) Inj 1000 Unit/ML: INTRAMUSCULAR | Qty: 30 | Status: AC

## 2016-01-05 MED FILL — Sodium Chloride IV Soln 0.9%: INTRAVENOUS | Qty: 1000 | Status: AC

## 2016-01-05 SURGICAL SUPPLY — 58 items
BAG DECANTER FOR FLEXI CONT (MISCELLANEOUS) ×2 IMPLANT
BLADE CLIPPER SURG (BLADE) IMPLANT
BUR MATCHSTICK NEURO 3.0 LAGG (BURR) ×2 IMPLANT
CAGE COROENT LRG 8X9X28M SPINE (Cage) ×4 IMPLANT
CANISTER SUCT 3000ML PPV (MISCELLANEOUS) ×2 IMPLANT
CONNECTOR RELINE ROTATING 5-6 (Connector) ×4 IMPLANT
CONT SPEC 4OZ CLIKSEAL STRL BL (MISCELLANEOUS) ×2 IMPLANT
COVER BACK TABLE 60X90IN (DRAPES) ×2 IMPLANT
DECANTER SPIKE VIAL GLASS SM (MISCELLANEOUS) IMPLANT
DERMABOND ADVANCED (GAUZE/BANDAGES/DRESSINGS) ×1
DERMABOND ADVANCED .7 DNX12 (GAUZE/BANDAGES/DRESSINGS) ×1 IMPLANT
DEVICE DISSECT PLASMABLAD 3.0S (MISCELLANEOUS) ×1 IMPLANT
DRAPE C-ARM 42X72 X-RAY (DRAPES) ×4 IMPLANT
DRAPE LAPAROTOMY 100X72X124 (DRAPES) ×2 IMPLANT
DRAPE POUCH INSTRU U-SHP 10X18 (DRAPES) ×2 IMPLANT
DRAPE PROXIMA HALF (DRAPES) IMPLANT
DRSG OPSITE POSTOP 4X8 (GAUZE/BANDAGES/DRESSINGS) ×2 IMPLANT
DURAPREP 26ML APPLICATOR (WOUND CARE) ×2 IMPLANT
ELECT REM PT RETURN 9FT ADLT (ELECTROSURGICAL) ×2
ELECTRODE REM PT RTRN 9FT ADLT (ELECTROSURGICAL) ×1 IMPLANT
GAUZE SPONGE 4X4 12PLY STRL (GAUZE/BANDAGES/DRESSINGS) ×2 IMPLANT
GAUZE SPONGE 4X4 16PLY XRAY LF (GAUZE/BANDAGES/DRESSINGS) ×2 IMPLANT
GLOVE BIO SURGEON STRL SZ7 (GLOVE) ×2 IMPLANT
GLOVE BIOGEL PI IND STRL 7.5 (GLOVE) ×1 IMPLANT
GLOVE BIOGEL PI IND STRL 8.5 (GLOVE) ×2 IMPLANT
GLOVE BIOGEL PI INDICATOR 7.5 (GLOVE) ×1
GLOVE BIOGEL PI INDICATOR 8.5 (GLOVE) ×2
GLOVE ECLIPSE 8.5 STRL (GLOVE) ×4 IMPLANT
GOWN STRL REUS W/ TWL LRG LVL3 (GOWN DISPOSABLE) IMPLANT
GOWN STRL REUS W/ TWL XL LVL3 (GOWN DISPOSABLE) ×4 IMPLANT
GOWN STRL REUS W/TWL 2XL LVL3 (GOWN DISPOSABLE) ×4 IMPLANT
GOWN STRL REUS W/TWL LRG LVL3 (GOWN DISPOSABLE)
GOWN STRL REUS W/TWL XL LVL3 (GOWN DISPOSABLE) ×4
HEMOSTAT POWDER KIT SURGIFOAM (HEMOSTASIS) ×2 IMPLANT
KIT BASIN OR (CUSTOM PROCEDURE TRAY) ×2 IMPLANT
KIT INFUSE X SMALL 1.4CC (Orthopedic Implant) ×2 IMPLANT
KIT ROOM TURNOVER OR (KITS) ×2 IMPLANT
NEEDLE HYPO 22GX1.5 SAFETY (NEEDLE) ×2 IMPLANT
NS IRRIG 1000ML POUR BTL (IV SOLUTION) ×2 IMPLANT
PACK LAMINECTOMY NEURO (CUSTOM PROCEDURE TRAY) ×2 IMPLANT
PAD ARMBOARD 7.5X6 YLW CONV (MISCELLANEOUS) ×6 IMPLANT
PATTIES SURGICAL .5 X1 (DISPOSABLE) ×2 IMPLANT
PLASMABLADE 3.0S (MISCELLANEOUS) ×2
ROD RELINE TI LATERAL MED OFF (Rod) ×4 IMPLANT
SCREW LOCK RELINE 5.5 TULIP (Screw) ×12 IMPLANT
SCREW RELINE-O POLY 6.5X45 (Screw) ×4 IMPLANT
SPONGE LAP 4X18 X RAY DECT (DISPOSABLE) IMPLANT
SPONGE SURGIFOAM ABS GEL 100 (HEMOSTASIS) ×2 IMPLANT
SUT VIC AB 1 CT1 18XBRD ANBCTR (SUTURE) ×1 IMPLANT
SUT VIC AB 1 CT1 8-18 (SUTURE) ×1
SUT VIC AB 2-0 CP2 18 (SUTURE) ×2 IMPLANT
SUT VIC AB 3-0 SH 8-18 (SUTURE) ×2 IMPLANT
SYR 3ML LL SCALE MARK (SYRINGE) ×8 IMPLANT
TOWEL OR 17X24 6PK STRL BLUE (TOWEL DISPOSABLE) ×2 IMPLANT
TOWEL OR 17X26 10 PK STRL BLUE (TOWEL DISPOSABLE) ×2 IMPLANT
TRAP SPECIMEN MUCOUS 40CC (MISCELLANEOUS) ×2 IMPLANT
TRAY FOLEY W/METER SILVER 16FR (SET/KITS/TRAYS/PACK) ×2 IMPLANT
WATER STERILE IRR 1000ML POUR (IV SOLUTION) ×2 IMPLANT

## 2016-01-05 NOTE — Transfer of Care (Signed)
Immediate Anesthesia Transfer of Care Note  Patient: Kenneth Lee  Procedure(s) Performed: Procedure(s) with comments: Lumbar Two-Three Posterior Lumbar Interbody Fusion, attaching to L3-5 fusion (N/A) - L2-3 posterior lumbar interbody fusion, attaching to L3-5 fusion  Patient Location: PACU  Anesthesia Type:General  Level of Consciousness: awake, oriented, patient cooperative and lethargic  Airway & Oxygen Therapy: Patient Spontanous Breathing and Patient connected to face mask oxygen  Post-op Assessment: Report given to RN, Post -op Vital signs reviewed and stable and Patient moving all extremities X 4  Post vital signs: Reviewed and stable  Last Vitals:  BP 165/76 HR 61 RR 17 SpO2 100% on 4L [place] Resting comfortably, maintains good airway, denies pain.      Complications: No apparent anesthesia complications

## 2016-01-05 NOTE — Progress Notes (Signed)
Patient ID: Kenneth Lee, male   DOB: 11/18/1945, 70 y.o.   MRN: [id number] Vital signs are stable Motor function appears intact Dressing is dry Comfortable

## 2016-01-05 NOTE — Progress Notes (Signed)
ANTIBIOTIC CONSULT NOTE - INITIAL  Pharmacy Consult for vancomycin Indication: surgical prophylaxis  Allergies  Allergen Reactions  . Penicillins Swelling    Has patient had a PCN reaction causing immediate rash, facial/tongue/throat swelling, SOB or lightheadedness with hypotension: Yes Has patient had a PCN reaction causing severe rash involving mucus membranes or skin necrosis: No Has patient had a PCN reaction that required hospitalization No Has patient had a PCN reaction occurring within the last 10 years: No If all of the above answers are "NO", then may proceed with Cephalosporin use.    Patient Measurements: Weight: 267 lb (121.11 kg) Adjusted Body Weight:   Vital Signs: Temp: 97.7 F (36.5 C) (06/27 1750) Temp Source: Oral (06/27 0818) BP: 148/71 mmHg (06/27 1750) Pulse Rate: 51 (06/27 1750) Intake/Output from previous day:   Intake/Output from this shift: Total I/O In: 2200 [I.V.:2200] Out: 490 [Urine:240; Blood:250]  Labs: No results for input(s): WBC, HGB, PLT, LABCREA, CREATININE in the last 72 hours. Estimated Creatinine Clearance: 111.4 mL/min (by C-G formula based on Cr of 0.71). No results for input(s): VANCOTROUGH, VANCOPEAK, VANCORANDOM, GENTTROUGH, GENTPEAK, GENTRANDOM, TOBRATROUGH, TOBRAPEAK, TOBRARND, AMIKACINPEAK, AMIKACINTROU, AMIKACIN in the last 72 hours.   Microbiology: Recent Results (from the past 720 hour(s))  Surgical pcr screen     Status: None   Collection Time: 12/30/15 10:34 AM  Result Value Ref Range Status   MRSA, PCR NEGATIVE NEGATIVE Final   Staphylococcus aureus NEGATIVE NEGATIVE Final    Comment:        The Xpert SA Assay (FDA approved for NASAL specimens in patients over 81 years of age), is one component of a comprehensive surveillance program.  Test performance has been validated by [facility] for patients greater than or equal to 30 year old. It is not intended to diagnose infection nor to guide or monitor  treatment.     Medical History: Past Medical History  Diagnosis Date  . Sleep apnea   . Hyperlipidemia     takes Crestor daily  . Other specified cardiac dysrhythmias(427.89)   . Sinoatrial node dysfunction (HCC)   . Carotid disease, bilateral ([place])   . Coronary artery disease     with bare mental stent 2008 and RCA is Card   Dr.Smith  . Diabetes mellitus without complication ([place])     takes Metformin daily  . Hypertension     takes Amlodipine,Bystolic,Apresoline,and Lisinopril daily.   . Peripheral edema     takes Lasix daily  . Headache     occasionally  . Weakness     numbness and tingling,left leg  . Peripheral neuropathy ([place])   . Arthritis   . Joint pain   . Chronic back pain     stenosis  . History of colon polyps     benign    Medications:  Scheduled:  . amLODipine  5 mg Oral Daily  . docusate sodium  100 mg Oral BID  . furosemide  40 mg Oral Daily  . hydrALAZINE  25 mg Oral TID  . HYDROmorphone      . ketorolac  15 mg Intravenous Q6H  . lisinopril  20 mg Oral Daily  . [START ON 01/06/2016] metFORMIN  500 mg Oral Q breakfast  . nebivolol  5 mg Oral Daily  . rosuvastatin  10 mg Oral Daily  . senna  1 tablet Oral BID  . sodium chloride flush  3 mL Intravenous Q12H   Infusions:  . sodium chloride    . sodium chloride  Assessment: 70 yo male s/p neurosurgery will be put on vancomycin for surgical prophylaxis.  Patient received vancomycin 1500 mg iv x1 at 1200.  CrCl >100.  Per RN, patient doesn't have a drain  Goal of Therapy:  Vancomycin trough level 10-15 mcg/ml  Plan:  - vancomycin 1g iv x1 at midnight. Pharmacy will sign off  Kenneth Lee, Tsz-Yin 01/05/2016,6:02 PM

## 2016-01-05 NOTE — Anesthesia Postprocedure Evaluation (Signed)
Anesthesia Post Note  Patient: Kenneth Lee  Procedure(s) Performed: Procedure(s) (LRB): Lumbar Two-Three Posterior Lumbar Interbody Fusion, attaching to L3-5 fusion (N/A)  Patient location during evaluation: PACU Anesthesia Type: General Level of consciousness: sedated and patient cooperative Pain management: pain level controlled Vital Signs Assessment: post-procedure vital signs reviewed and stable Respiratory status: spontaneous breathing, nonlabored ventilation, respiratory function stable and patient connected to nasal cannula oxygen Cardiovascular status: blood pressure returned to baseline and stable Postop Assessment: no signs of nausea or vomiting Anesthetic complications: no    Last Vitals:  Filed Vitals:   01/05/16 1705 01/05/16 1720  BP: 146/41 131/61  Pulse: 53 51  Temp:    Resp: 20 14    Last Pain:  Filed Vitals:   01/05/16 1725  PainSc: Asleep    LLE Motor Response: Purposeful movement (01/05/16 1720) LLE Sensation: Full sensation (01/05/16 1720) RLE Motor Response: Purposeful movement (01/05/16 1720) RLE Sensation: Full sensation (01/05/16 1720)      Seleta Rhymes. Teosha Casso

## 2016-01-05 NOTE — H&P (Signed)
Kenneth Lee is an 70 y.o. male.   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 He underwent a decompression from L3-L5 with posterior interbody arthrodesis at L3-4 and L4-5. He recovered well and regained most of the strength however he had some permanent weakness in his tibialis anterior is noted now increasing pain in his back and both lower extremities. Recent MRI demonstrates that he is now developing severe stenosis at the level of L2-L3 above his fusion. He is taken to the operating room to undergo surgical decompression and arthrodesis at that level.  Past Medical History  Diagnosis Date  . Sleep apnea   . Hyperlipidemia     takes Crestor daily  . Other specified cardiac dysrhythmias(427.89)   . Sinoatrial node dysfunction ([facility])   . Carotid disease, bilateral ([place])   . Coronary artery disease     with bare mental stent 2008 and RCA is Card   Dr.Smith  . Diabetes mellitus without complication ([place])     takes Metformin daily  . Hypertension     takes Amlodipine,Bystolic,Apresoline,and Lisinopril daily.   . Peripheral edema     takes Lasix daily  . Headache     occasionally  . Weakness     numbness and tingling,left leg  . Peripheral neuropathy ([place])   . Arthritis   . Joint pain   . Chronic back pain     stenosis  . History of colon polyps     benign    Past Surgical History  Procedure Laterality Date  . Discectomy  20 yrs ago  . Back surgery  2016    fusion  . Cardiac catheterization  2008  . Coronary angioplasty      1 stent  . Cholecystectomy  2001  . Aortobifemoral bypass  1996  . Colonoscopy    . Esophagogastroduodenoscopy      Family History  Problem Relation Age of Onset  . Heart disease Mother   . Cancer Mother   . Hypertension Other   . Hyperlipidemia Other   . Heart  attack Mother   . Stroke Neg Hx   . Hypertension Mother   . Hypertension Father    Social History:  reports that he has quit smoking. He does not have any smokeless tobacco history on file. He reports that he drinks alcohol. He reports that he does not use illicit drugs.  Allergies:  Allergies  Allergen Reactions  . Penicillins Swelling    Has patient had a PCN reaction causing immediate rash, facial/tongue/throat swelling, SOB or lightheadedness with hypotension: Yes Has patient had a PCN reaction causing severe rash involving mucus membranes or skin necrosis: No Has patient had a PCN reaction that required hospitalization No Has patient had a PCN reaction occurring within the last 10 years: No If all of the above answers are "NO", then may proceed with Cephalosporin use.    Medications Prior to Admission  Medication Sig Dispense Refill  . amLODipine (NORVASC) 5 MG tablet TAKE 1 TABLET (5 MG TOTAL) BY MOUTH DAILY. 90 tablet 3  . aspirin EC 325 MG tablet Take 325 mg by mouth daily.     Marland Kitchen BYSTOLIC 5 MG tablet TAKE 1 TABLET EVERY DAY 90 tablet 2  . CRESTOR 10 MG tablet TAKE 1 TABLET BY MOUTH EVERY DAY 30 tablet 8  .  furosemide (LASIX) 40 MG tablet Take 1 tablet (40 mg total) by mouth daily. 90 tablet 3  . hydrALAZINE (APRESOLINE) 25 MG tablet TAKE 1 TABLET BY MOUTH 3 TIMES A DAY 270 tablet 2  . HYDROcodone-acetaminophen (NORCO) 10-325 MG tablet Take 1 tablet by mouth every 6 (six) hours as needed. for pain  0  . lisinopril (PRINIVIL,ZESTRIL) 20 MG tablet TAKE 1 TABLET (20 MG TOTAL) BY MOUTH DAILY. 90 tablet 3  . meloxicam (MOBIC) 15 MG tablet Take 15 mg by mouth daily.  7  . metFORMIN (GLUCOPHAGE) 500 MG tablet Take 500 mg by mouth daily with breakfast.     . Multiple Vitamin (MULTIVITAMIN WITH MINERALS) TABS tablet Take 1 tablet by mouth daily.    . Omega-3 Fatty Acids (FISH OIL) 1000 MG CAPS Take 1,000 mg by mouth daily.     . nitroGLYCERIN (NITROSTAT) 0.4 MG SL tablet Place 0.4 mg  under the tongue every 5 (five) minutes as needed for chest pain.      Results for orders placed or performed during the hospital encounter of 01/05/16 (from the past 48 hour(s))  Glucose, capillary     Status: Abnormal   Collection Time: 01/05/16  8:21 AM  Result Value Ref Range   Glucose-Capillary 140 (H) 65 - 99 mg/dL   No results found.  Review of Systems  HENT: Negative.   Eyes: Negative.   Respiratory: Negative.   Cardiovascular: Negative.   Gastrointestinal: Negative.   Genitourinary: Negative.   Musculoskeletal: Positive for back pain.  Skin: Negative.   Neurological: Positive for weakness.  Endo/Heme/Allergies: Negative.   Psychiatric/Behavioral: Negative.     Blood pressure 160/60, pulse 56, temperature 98 F (36.7 C), temperature source Oral, resp. rate 20, weight 121.11 kg (267 lb), SpO2 93 %. Physical Exam  Constitutional: He is oriented to person, place, and time. He appears well-developed and well-nourished.  HENT:  Head: Normocephalic and atraumatic.  Eyes: EOM are normal. Pupils are equal, round, and reactive to light.  Neck: Normal range of motion. Neck supple.  Cardiovascular: Normal rate and regular rhythm.   Respiratory: Effort normal and breath sounds normal.  GI: Soft. Bowel sounds are normal.  Musculoskeletal:  Moderate tenderness to palpation and percussion in the lumbar spine.  Neurological: He is alert and oriented to person, place, and time.  Weakness in the tibialis anterior on the right at 4 minus out of 5 weakness in the gastroc on the left at 4 minus out of 5. Proximal leg strength is intact. Absent reflexes are noted in the patellae and the Achilles both.  Skin: Skin is warm and dry.  Psychiatric: He has a normal mood and affect. His behavior is normal. Judgment and thought content normal.     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 Status post decompression and fusion L3-L5.  Decompression of L2-3 and  arthrodesis with connection to previous construct at L3-L5.  Earleen Newport, MD 01/05/2016, 12:25 PM

## 2016-01-05 NOTE — Op Note (Addendum)
Date of surgery: 01/05/2016 Preoperative diagnosis: Lumbar spondylosis and stenosis L2-L3, status post decompression arthrodesis L3-L5 Postoperative diagnosis: Lumbar spondylosis and stenosis L2-L3 status post decompression arthrodesis L3 L5. Procedure: Bilateral laminectomies L2-3 with decompression of the L2 and L3 nerve roots with more work than required for simple posterior interbody technique, posterior lumbar interbody fusion using peek spacers local autograft and allograft and infuse. Posterior lateral arthrodesis L2-L3 with local autograft allograft and infuse. Pedicle screw fixation L2 to L3-L5 construct. Surgeon: Kristeen Miss First assistant: Erline Levine M.D. Anesthesia: Gen. endotracheal Indications: [name] is a 70 year old individual who in October 2016 had undergone decompression and fusion for severe stenosis from L3-L5. Over the ensuing months he is healed well however he's developed a significant stenosis at the level of L2-L3 with a left-sided lumbar radiculopathy that is been quite severe. He's been advised regarding the nature of surgery to decompress and stabilize this to the previously placed fusion.  Procedure: The patient was brought to the operating room supine on a stretcher area after the smooth induction of general endotracheal anesthesia, he was turned prone. The back was prepped with alcohol DuraPrep and draped in a sterile fashion. The old incision was opened in its superior aspect. Dissection was carried down through the lumbar dorsal fascia which was opened on the side of midline to expose the previously placed hardware. The interval between the L3 and L4 screws was then stripped and decorticated so as to apply a side car connector to the rod. This was placed provisionally at first. Then laminectomy was created at the L2-L3 interspace removing the entirety of the laminar arch on the left side including the entirety of the facet joint at the L2-3 level. This  allowed for good decompression of the central dural canal the thickened redundant yellow ligament and the path for the L3 nerve root inferiorly and the L2 root superiorly. These were decompressed using a 2 mm and 3 mm Kerrison punch and a high-speed drill. The disc space was ultimately isolated and a discectomy was completed at the L2-3 space. The disc space was entered and there was  severely degenerated disc material within the disc space that was evacuated. The endplates were shaved and decorticated while an interspace spreader was used to distract the interspace. Ultimately an 8 mm tall 28 mm long 8 lordotic spacer was able to be placed into the interspace the spacers were filled with a combination of autograft allograft and infuse and a total of 9 mL of the autograft allograft neck sure was placed into the interspace along with 2 cages. Fluoroscopic guidance was then used to place pedicle screws at the L2 vertebrae these were 6.5 x 45 mm screws that were placed under direct fluoroscopic guidance. The lateral gutters had been decorticated and these were then packed with the remainder of the autograft mixture in addition to to strips of infuse that were placed between the transverse processes at L2 and L3. Then a Z rod was cut to the appropriate length and placed between the screw of L2 and the side car connectors at the L3-L5 construct. This was torqued down a neutral construct. Final radiographs were obtained in AP and lateral projection found to be adequate. The lumbar dorsal fascia was then closed with interrupted Vicryl and 2-0 Vicryl was used in the subcutaneous anus tissues. 20 mL of half percent Marcaine was injected into the paraspinous fascia. 3-0 Vicryl was used to close subcuticular skin. Dermabond was placed on the skin. Patient tolerated  procedure well and returned to recovery room in stable condition.

## 2016-01-05 NOTE — Anesthesia Procedure Notes (Signed)
Procedure Name: Intubation Date/Time: 01/05/2016 12:35 PM Performed by: Willeen Cass P Pre-anesthesia Checklist: Patient identified, Emergency Drugs available, Suction available, Timeout performed and Patient being monitored Patient Re-evaluated:Patient Re-evaluated prior to inductionOxygen Delivery Method: Circle system utilized Preoxygenation: Pre-oxygenation with 100% oxygen Intubation Type: IV induction Ventilation: Oral airway inserted - appropriate to patient size and Two handed mask ventilation required Laryngoscope Size: Glidescope (Large) Grade View: Grade I Tube type: Oral Tube size: 7.5 mm Number of attempts: 1 Airway Equipment and Method: Stylet Placement Confirmation: ETT inserted through vocal cords under direct vision,  positive ETCO2 and breath sounds checked- equal and bilateral Secured at: 23 cm Tube secured with: Tape Dental Injury: Teeth and Oropharynx as per pre-operative assessment

## 2016-01-05 NOTE — Anesthesia Preprocedure Evaluation (Addendum)
Anesthesia Evaluation  Patient identified by MRN, date of birth, ID band Patient awake    Reviewed: Allergy & Precautions, NPO status , Patient's Chart, lab work & pertinent test results, reviewed documented beta blocker date and time   History of Anesthesia Complications Negative for: history of anesthetic complications  Airway Mallampati: III  TM Distance: >3 FB Neck ROM: Full    Dental  (+) Dental Advisory Given   Pulmonary sleep apnea and Continuous Positive Airway Pressure Ventilation , former smoker,    breath sounds clear to auscultation       Cardiovascular hypertension, Pt. on medications and Pt. on home beta blockers (-) angina+ CAD, + Cardiac Stents and + Peripheral Vascular Disease   Rhythm:Regular Rate:Normal  '16 ECHO: EF 0000000, grade 2 diastolic dysfunction, aortic valve calcification without stenosis   Neuro/Psych  Headaches, Chronic back pain: narcotics    GI/Hepatic negative GI ROS, Neg liver ROS,   Endo/Other  diabetes (glu 140), Oral Hypoglycemic AgentsMorbid obesity  Renal/GU negative Renal ROS     Musculoskeletal  (+) Arthritis , Osteoarthritis,    Abdominal (+) + obese,   Peds  Hematology   Anesthesia Other Findings   Reproductive/Obstetrics                            Anesthesia Physical Anesthesia Plan  ASA: III  Anesthesia Plan: General   Post-op Pain Management:    Induction: Intravenous  Airway Management Planned: Oral ETT  Additional Equipment:   Intra-op Plan:   Post-operative Plan: Extubation in OR  Informed Consent: I have reviewed the patients History and Physical, chart, labs and discussed the procedure including the risks, benefits and alternatives for the proposed anesthesia with the patient or authorized representative who has indicated his/her understanding and acceptance.   Dental advisory given  Plan Discussed with: CRNA and  Surgeon  Anesthesia Plan Comments: (Plan routine monitors, GETA)        Anesthesia Quick Evaluation

## 2016-01-06 LAB — CBC
HCT: 38.7 % — ABNORMAL LOW (ref 39.0–52.0)
Hemoglobin: 12.6 g/dL — ABNORMAL LOW (ref 13.0–17.0)
MCH: 31 pg (ref 26.0–34.0)
MCHC: 32.6 g/dL (ref 30.0–36.0)
MCV: 95.1 fL (ref 78.0–100.0)
Platelets: 147 10*3/uL — ABNORMAL LOW (ref 150–400)
RBC: 4.07 MIL/uL — ABNORMAL LOW (ref 4.22–5.81)
RDW: 13.1 % (ref 11.5–15.5)
WBC: 11.4 10*3/uL — ABNORMAL HIGH (ref 4.0–10.5)

## 2016-01-06 LAB — BASIC METABOLIC PANEL
Anion gap: 7 (ref 5–15)
BUN: 18 mg/dL (ref 6–20)
CO2: 23 mmol/L (ref 22–32)
Calcium: 8.8 mg/dL — ABNORMAL LOW (ref 8.9–10.3)
Chloride: 105 mmol/L (ref 101–111)
Creatinine, Ser: 0.93 mg/dL (ref 0.61–1.24)
GFR calc Af Amer: >60 mL/min (ref >60)
GFR calc non Af Amer: >60 mL/min (ref >60)
Glucose, Bld: 166 mg/dL — ABNORMAL HIGH (ref 65–99)
Potassium: 4.3 mmol/L (ref 3.5–5.1)
Sodium: 135 mmol/L (ref 135–145)

## 2016-01-06 LAB — GLUCOSE, CAPILLARY
Glucose-Capillary: 133 mg/dL — ABNORMAL HIGH (ref 65–99)
Glucose-Capillary: 162 mg/dL — ABNORMAL HIGH (ref 65–99)
Glucose-Capillary: 118 mg/dL — ABNORMAL HIGH (ref 65–99)
Glucose-Capillary: 122 mg/dL — ABNORMAL HIGH (ref 65–99)

## 2016-01-06 MED ORDER — CIPROFLOXACIN-DEXAMETHASONE 0.3-0.1 % OT SUSP
4.0000 [drp] | Freq: Two times a day (BID) | OTIC | Status: DC
  Administered 2016-01-06 – 2016-01-08 (×5): 4 [drp] via OTIC
  Filled 2016-01-06: qty 7.5

## 2016-01-06 NOTE — Evaluation (Signed)
Occupational Therapy Evaluation Patient Details Name: Kenneth Lee MRN: [id number] DOB: 05-15-1946 Today's Date: 01/06/2016    History of Present Illness Patient is a 70 y/o male with hx of CAD, HTN, HLD, DM presents s/p L2-3 PLIF, attaching to L3-5 fusion.   Clinical Impression   Pt reports he was independent with ADLs PTA. Currently pt overall min guard for safety with ADLs and functional mobility with the exception of mod assist for LB ADLs. OT eval limited by arrival of lunch tray but provided pt with back, safety, and ADL education. Pt reports he will have 24/7 supervision upon return home. At this time recommending [place] for follow up in order to maximize independence and safety with ADLs and functional mobility. Pt may progress to home without OT follow up. Pt would benefit from continued skilled OT to address established goals.    Follow Up Recommendations  Home health OT;Supervision/Assistance - 24 hour (initially)    Equipment Recommendations  3 in 1 bedside comode;Other (comment) (AE?)    Recommendations for Other Services       Precautions / Restrictions Precautions Precautions: Back Precaution Booklet Issued: No Precaution Comments: Reviewed back precautions with pt Required Braces or Orthoses: Spinal Brace Spinal Brace: Lumbar corset;Applied in sitting position Restrictions Weight Bearing Restrictions: No      Mobility Bed Mobility Overal bed mobility: Needs Assistance Bed Mobility: Rolling;Sidelying to Sit Rolling: Supervision Sidelying to sit: Supervision;HOB elevated (HOB slightly elevated)     Sit to sidelying: Supervision General bed mobility comments: Supervision for safety; no physical assist required. Good log roll technique with use of bed rails.  Transfers Overall transfer level: Needs assistance Equipment used: Rolling walker (2 wheeled) Transfers: Sit to/from Stand Sit to Stand: Min guard         General transfer comment: Min guard  for safety. VCs for hand placement.    Balance Overall balance assessment: Needs assistance Sitting-balance support: Feet supported;No upper extremity supported Sitting balance-Leahy Scale: Good Sitting balance - Comments: Donned LSO with setup   Standing balance support: Bilateral upper extremity supported Standing balance-Leahy Scale: Poor Standing balance comment: RW for support                            ADL Overall ADL's : Needs assistance/impaired Eating/Feeding: Set up;Sitting   Grooming: Set up;Supervision/safety;Sitting   Upper Body Bathing: Set up;Supervision/ safety;Sitting   Lower Body Bathing: Moderate assistance;Sit to/from stand   Upper Body Dressing : Set up;Supervision/safety;Sitting Upper Body Dressing Details (indicate cue type and reason): to don brace Lower Body Dressing: Moderate assistance;Sit to/from stand Lower Body Dressing Details (indicate cue type and reason): Pt unable to cross foot over opposite knee. Reports friend can assist with ADLs as needed; pt may benefit from use of AE, plan to practice next session.  Toilet Transfer: Min guard;Ambulation;BSC;RW Toilet Transfer Details (indicate cue type and reason): Simulated by transfer from EOB to chair Toileting- Clothing Manipulation and Hygiene: Moderate assistance;Sit to/from stand       Functional mobility during ADLs: Min guard;Rolling walker General ADL Comments: Pt reports he would like to return home and has arranged 24/7 supervision between son and friend; discussed [place] options, pt agreeable.      Vision     Perception     Praxis      Pertinent Vitals/Pain Pain Assessment: Faces Pain Score: 5  Faces Pain Scale: Hurts even more Pain Location: back Pain Descriptors / Indicators: Aching;Sore;Grimacing Pain  Intervention(s): Monitored during session;Premedicated before session;Repositioned     Hand Dominance     Extremity/Trunk Assessment Upper Extremity Assessment Upper  Extremity Assessment: Overall WFL for tasks assessed   Lower Extremity Assessment Lower Extremity Assessment: Defer to PT evaluation   Cervical / Trunk Assessment Cervical / Trunk Assessment: Other exceptions Cervical / Trunk Exceptions: s/p lumbar sx   Communication Communication Communication: No difficulties   Cognition Arousal/Alertness: Awake/alert Behavior During Therapy: WFL for tasks assessed/performed Overall Cognitive Status: Within Functional Limits for tasks assessed                     General Comments       Exercises       Shoulder Instructions      Home Living Family/patient expects to be discharged to:: Private residence Living Arrangements: Alone Available Help at Discharge: Family;Friend(s);Available 24 hours/day Type of Home: House Home Access: Stairs to enter [employer] of Steps: 5 Entrance Stairs-Rails: Right;Left Home Layout: One level     Bathroom Shower/Tub: Tub/shower unit Shower/tub characteristics: Curtain Biochemist, clinical: Standard     Home Equipment: Cane - single point;Walker - 2 wheels;Shower seat          Prior Functioning/Environment Level of Independence: Independent with assistive device(s)        Comments: Pt using SPC PTA.    OT Diagnosis: Generalized weakness;Acute pain   OT Problem List: Decreased strength;Impaired balance (sitting and/or standing);Decreased knowledge of use of DME or AE;Decreased knowledge of precautions;Obesity;Pain   OT Treatment/Interventions: Self-care/ADL training;Energy conservation;DME and/or AE instruction;Therapeutic activities;Patient/family education;Balance training    OT Goals(Current goals can be found in the care plan section) Acute Rehab OT Goals Patient Stated Goal: return to PLOF OT Goal Formulation: With patient Time For Goal Achievement: 01/20/16 Potential to Achieve Goals: Good ADL Goals Pt Will Perform Grooming: with modified independence;standing Pt  Will Perform Lower Body Bathing: with modified independence;sit to/from stand (with or without AE) Pt Will Perform Lower Body Dressing: with modified independence;sit to/from stand (with or without AE) Pt Will Transfer to Toilet: with modified independence;ambulating;bedside commode Pt Will Perform Toileting - Clothing Manipulation and hygiene: with modified independence;sit to/from stand (with or without AE) Pt Will Perform Tub/Shower Transfer: Tub transfer;ambulating;shower seat;rolling walker;with modified independence Additional ADL Goal #1: Pt will independently verbally recall 3/3 back precautions and maintain throughout ADL.  OT Frequency: Min 2X/week   Barriers to D/C:            Co-evaluation              End of Session Equipment Utilized During Treatment: Rolling walker;Back brace  Activity Tolerance: Patient tolerated treatment well Patient left: in chair;with call bell/phone within reach;with chair alarm set   Time: [id number] OT Time Calculation (min): 10 min Charges:  OT General Charges $OT Visit: 1 Procedure OT Evaluation $OT Eval Moderate Complexity: 1 Procedure G-Codes:     Binnie Kand M.S., OTR/L Pager: [phone]  01/06/2016, 12:14 PM

## 2016-01-06 NOTE — Progress Notes (Signed)
Patient ID: MAYCON GRAP, male   DOB: 1946/02/01, 70 y.o.   MRN: [id number] Vital signs are stable Patient appears comfortable and alert postop Plan will be to mobilize patient is much as tolerated however because he lives alone and outside support staff is not available I believe he would be best served with either a short rehabilitation stay or skilled nursing facility that can support adequate physical therapy for him. We'll ask list to see regarding management of diabetes.

## 2016-01-06 NOTE — Evaluation (Signed)
Physical Therapy Evaluation Patient Details Name: Kenneth Lee MRN: [id number] DOB: 05/22/1946 Today's Date: 01/06/2016   History of Present Illness  Patient is a 70 y/o male with hx of CAD, HTN, HLD, DM presents s/p L2-3 PLIF, attaching to L3-5 fusion.  Clinical Impression  Patient presents with pain and post surgical deficits s/p above surgery impacting mobility. Tolerated gait training and transfers with Min-guard-supervision for safety. Pt was Mod I with SPC PTA. Pt will not have support at home at discharge as daughter just had surgery herself. Will talk with son to see if he can provide assist. Pt will most likely need Short term SNF to maximize independence and mobility prior to return home. If able to find assist for 1-2 weeks at home, will be safe to d/c with HHPT. Will follow.    Follow Up Recommendations SNF (as pt has no support at home)    Equipment Recommendations  None recommended by PT    Recommendations for Other Services OT consult     Precautions / Restrictions Precautions Precautions: Back Precaution Booklet Issued: No Precaution Comments: Reviewed 3/3 back precautions Restrictions Weight Bearing Restrictions: No      Mobility  Bed Mobility Overal bed mobility: Needs Assistance Bed Mobility: Rolling;Sidelying to Sit;Sit to Sidelying Rolling: Supervision Sidelying to sit: Supervision     Sit to sidelying: Supervision General bed mobility comments: Use of rail for support. Cues for log roll technique.   Transfers Overall transfer level: Needs assistance Equipment used: Rolling walker (2 wheeled) (bari RW) Transfers: Sit to/from Stand Sit to Stand: Supervision         General transfer comment: Supervision for safety.   Ambulation/Gait Ambulation/Gait assistance: Supervision Ambulation Distance (Feet): 150 Feet Assistive device: Rolling walker (2 wheeled) (bari RW) Gait Pattern/deviations: Step-through pattern;Decreased stride length Gait  velocity: decreased Gait velocity interpretation: Below normal speed for age/gender General Gait Details: Slow, mostly steady gait with cues for upright.  Stairs            Wheelchair Mobility    Modified Rankin (Stroke Patients Only)       Balance Overall balance assessment: Needs assistance Sitting-balance support: Feet supported;No upper extremity supported Sitting balance-Leahy Scale: Good Sitting balance - Comments: Donned LSO with setup   Standing balance support: During functional activity Standing balance-Leahy Scale: Fair Standing balance comment: Reliant on UEs for support.                             Pertinent Vitals/Pain Pain Assessment: 0-10 Pain Score: 5  Pain Location: back at incision Pain Descriptors / Indicators: Sore;Operative site guarding Pain Intervention(s): Monitored during session;Repositioned;Limited activity within patient's tolerance    Home Living Family/patient expects to be discharged to:: Private residence Living Arrangements: Alone Available Help at Discharge: Family;Available PRN/intermittently (does not have immediate support home) Type of Home: House Home Access: Stairs to enter Entrance Stairs-Rails: Psychiatric nurse of Steps: 5 Home Layout: One level Home Equipment: Cane - single point;Walker - 2 wheels      Prior Function Level of Independence: Independent with assistive device(s)         Comments: Pt using SPC PTA.     Hand Dominance        Extremity/Trunk Assessment   Upper Extremity Assessment: Defer to OT evaluation           Lower Extremity Assessment: Generalized weakness         Communication   Communication:  No difficulties  Cognition Arousal/Alertness: Awake/alert Behavior During Therapy: WFL for tasks assessed/performed Overall Cognitive Status: Within Functional Limits for tasks assessed                      General Comments General comments (skin  integrity, edema, etc.): Education re: positioning, log roll, precautions.    Exercises        Assessment/Plan    PT Assessment Patient needs continued PT services  PT Diagnosis Difficulty walking;Acute pain   PT Problem List Decreased strength;Pain;Decreased activity tolerance;Decreased mobility;Decreased balance;Decreased knowledge of precautions  PT Treatment Interventions Balance training;Gait training;Stair training;Functional mobility training;Therapeutic activities;Therapeutic exercise;Patient/family education   PT Goals (Current goals can be found in the Care Plan section) Acute Rehab PT Goals Patient Stated Goal: none stated PT Goal Formulation: With patient Time For Goal Achievement: 01/20/16 Potential to Achieve Goals: Fair    Frequency Min 5X/week   Barriers to discharge Decreased caregiver support lives alone and stairs to climb to get into home    Co-evaluation               End of Session Equipment Utilized During Treatment: Back brace;Gait belt Activity Tolerance: Patient tolerated treatment well Patient left: in bed;with call bell/phone within reach;with SCD's reapplied Nurse Communication: Mobility status         Time: [id number] PT Time Calculation (min) (ACUTE ONLY): 19 min   Charges:   PT Evaluation $PT Eval Moderate Complexity: 1 Procedure     PT G Codes:        Laia Wiley A Carime Dinkel 01/06/2016, 10:11 AM Wray Kearns, PT, DPT [phone]

## 2016-01-06 NOTE — Progress Notes (Signed)
OT Cancellation Note  Patient Details Name: Kenneth Lee MRN: [id number] DOB: Nov 13, 1945   Cancelled Treatment:    Reason Eval/Treat Not Completed: Pain limiting ability to participate. Pt politely declining to work with OT at this time; requesting OT check back later. Will follow up for OT eval as time allows.  Binnie Kand M.S., OTR/L Pager: [phone]  01/06/2016, 11:21 AM

## 2016-01-06 NOTE — Progress Notes (Signed)
Thank you for consult on Kenneth Lee. Chart reviewed and note that he did well after surgery in Oct.  Await PT/OT evaluations to help determine appropriate rehab venue.

## 2016-01-06 NOTE — Progress Notes (Signed)
Pt placed on CPAP with nasal mask and 2lt O2. tol well at this time.

## 2016-01-06 NOTE — Progress Notes (Signed)
Thank you for consult on Kenneth Lee. Therapy evaluations show that he is already at supervision level and ambulated 150' with walker. Anticipate good progress with therapy on acute. Will defer CIR consult for now.

## 2016-01-07 LAB — GLUCOSE, CAPILLARY
Glucose-Capillary: 147 mg/dL — ABNORMAL HIGH (ref 65–99)
Glucose-Capillary: 134 mg/dL — ABNORMAL HIGH (ref 65–99)
Glucose-Capillary: 107 mg/dL — ABNORMAL HIGH (ref 65–99)

## 2016-01-07 NOTE — Care Management Note (Signed)
Case Management Note  Patient Details  Name: Kenneth Lee MRN: [id number] Date of Birth: 02-17-1946  Subjective/Objective:                    Action/Plan: Plan is for patient to discharge home when medically ready. MD placed order for hospital bed. CM met with the patient and is in agreement. Jermaine with [facility] DME notified and will arrange delivery with the patient as the patient will have to notify someone to let the delivery person into his home. Also a recommendation for 3 in 1. Pt does not feel he needs this equipment. CM will continue to follow. Expected Discharge Date:                  Expected Discharge Plan:  [place]  In-House Referral:     Discharge planning Services  CM Consult  Post Acute Care Choice:  Durable Medical Equipment Choice offered to:  Patient  DME Arranged:  Hospital bed DME Agency:  [place]:    [facility] Agency:     Status of Service:  In process, will continue to follow  If discussed at Long Length of Stay Meetings, dates discussed:    Additional Comments:  Pollie Friar, RN 01/07/2016, 10:50 AM

## 2016-01-07 NOTE — Progress Notes (Signed)
Patient ID: Kenneth Lee, male   DOB: 31-Jan-1946, 71 y.o.   MRN: [id number] Vital signs stable  motor function improving  patient wants to be discharged home when ready and not go to snf [place] last time he had electric hospital bed for a while and did well. Will plan for dc home

## 2016-01-07 NOTE — Care Management Important Message (Signed)
Important Message  Patient Details  Name: Kenneth Lee MRN: [id number] Date of Birth: [birth year]   Medicare Important Message Given:  Yes    Loann Quill 01/07/2016, 9:54 AM

## 2016-01-07 NOTE — Progress Notes (Signed)
Physical Therapy Treatment Patient Details Name: Kenneth Lee MRN: [id number] DOB: Aug 08, 1945 Today's Date: 01/07/2016    History of Present Illness Patient is a 70 y/o male with hx of CAD, HTN, HLD, DM presents s/p L2-3 PLIF, attaching to L3-5 fusion.    PT Comments    Patient progressing well towards PT goals. Tolerated bed mobility, transfers and gait training with Min guard for safety. Reports pain in right buttock with mobility requiring 2 standing rest breaks. Performed stair training with Min guard for safety. Pt able to recall 3/3 back precautions. Discharge recommendation updated to home with HHPT as pt reports he will have 24/7 supervision at home from friend and a son. Will follow.   Follow Up Recommendations  Home health PT;Supervision - Intermittent     Equipment Recommendations  None recommended by PT    Recommendations for Other Services       Precautions / Restrictions Precautions Precautions: Back Precaution Booklet Issued: No Precaution Comments: Reviewed back precautions with pt Required Braces or Orthoses: Spinal Brace Spinal Brace: Lumbar corset;Applied in sitting position Restrictions Weight Bearing Restrictions: No    Mobility  Bed Mobility Overal bed mobility: Needs Assistance Bed Mobility: Sit to Sidelying;Rolling Rolling: Supervision       Sit to sidelying: Supervision General bed mobility comments: HOB flat, no use of rails to simulate home. Good demo of log roll technique.   Transfers Overall transfer level: Needs assistance Equipment used: Rolling walker (2 wheeled) (bari) Transfers: Sit to/from Stand Sit to Stand: Supervision         General transfer comment: SUpervision for safety. Good demo of hand placement.   Ambulation/Gait Ambulation/Gait assistance: Min guard Ambulation Distance (Feet): 160 Feet Assistive device: Rolling walker (2 wheeled) (bari RW) Gait Pattern/deviations: Step-through pattern;Decreased stride  length;Antalgic;Trunk flexed Gait velocity: decreased Gait velocity interpretation: Below normal speed for age/gender General Gait Details: Slow, antalgic gait due to pain in right buttock. 2 standing rest breaks. Cues for upright.   Stairs Stairs: Yes Stairs assistance: Min guard Stair Management: Alternating pattern;Two rails Number of Stairs: 3 (+ 2 steps x2 bouts) General stair comments: Cues for technique/safety.   Wheelchair Mobility    Modified Rankin (Stroke Patients Only)       Balance Overall balance assessment: Needs assistance Sitting-balance support: Feet supported;No upper extremity supported Sitting balance-Leahy Scale: Good     Standing balance support: During functional activity Standing balance-Leahy Scale: Fair Standing balance comment: Able to stand and urinate with UE support, no difficulties.                     Cognition Arousal/Alertness: Awake/alert Behavior During Therapy: WFL for tasks assessed/performed Overall Cognitive Status: Within Functional Limits for tasks assessed                      Exercises      General Comments        Pertinent Vitals/Pain Pain Assessment: Faces Faces Pain Scale: Hurts a little bit Pain Location: back at incision, right buttock Pain Descriptors / Indicators: Sore;Aching;Sharp Pain Intervention(s): Monitored during session;Repositioned    Home Living                      Prior Function            PT Goals (current goals can now be found in the care plan section) Progress towards PT goals: Progressing toward goals    Frequency  Min 5X/week  PT Plan Discharge plan needs to be updated    Co-evaluation             End of Session Equipment Utilized During Treatment: Back brace;Gait belt Activity Tolerance: Patient tolerated treatment well Patient left: in bed;with call bell/phone within reach;with bed alarm set;with SCD's reapplied (with MD in room)     Time:  [id number] PT Time Calculation (min) (ACUTE ONLY): 17 min  Charges:  $Gait Training: 8-22 mins                    G Codes:      Morayo Leven A Anarie Kalish 01/07/2016, 9:25 AM Wray Kearns, [place], DPT [phone]

## 2016-01-08 LAB — GLUCOSE, CAPILLARY
Glucose-Capillary: 128 mg/dL — ABNORMAL HIGH (ref 65–99)
Glucose-Capillary: 112 mg/dL — ABNORMAL HIGH (ref 65–99)
Glucose-Capillary: 123 mg/dL — ABNORMAL HIGH (ref 65–99)
Glucose-Capillary: 150 mg/dL — ABNORMAL HIGH (ref 65–99)

## 2016-01-08 MED ORDER — OXYCODONE-ACETAMINOPHEN 5-325 MG PO TABS: 1.0000 | ORAL_TABLET | ORAL | Status: DC | PRN

## 2016-01-08 MED ORDER — METHOCARBAMOL 500 MG PO TABS: 500.0000 mg | ORAL_TABLET | Freq: Four times a day (QID) | ORAL | Status: DC | PRN

## 2016-01-08 NOTE — Progress Notes (Signed)
Patient has home CPAP and places himself on and off the machine. Will call if any assistance needed.

## 2016-01-08 NOTE — Progress Notes (Signed)
Physical Therapy Treatment Patient Details Name: Kenneth Lee MRN: [id number] DOB: 02/08/1946 Today's Date: 01/08/2016    History of Present Illness Patient is a 70 y/o male with hx of CAD, HTN, HLD, DM presents s/p L2-3 PLIF, attaching to L3-5 fusion.    PT Comments    Patient is making good progress with PT.  From a mobility standpoint anticipate patient will be ready for DC home when medically ready.     Follow Up Recommendations  Home health PT;Supervision - Intermittent     Equipment Recommendations  None recommended by PT    Recommendations for Other Services OT consult     Precautions / Restrictions Precautions Precautions: Back Precaution Booklet Issued: No Precaution Comments: pt recalled 3/3 back preacuations Required Braces or Orthoses: Spinal Brace Spinal Brace: Lumbar corset;Applied in sitting position Restrictions Weight Bearing Restrictions: No    Mobility  Bed Mobility Overal bed mobility: Modified Independent Bed Mobility: Sit to Sidelying;Rolling Rolling: Modified independent (Device/Increase time)       Sit to sidelying: Modified independent (Device/Increase time) General bed mobility comments: HOB flat, no use of rails to simulate home. Good demo of log roll technique.   Transfers Overall transfer level: Needs assistance Equipment used: Rolling walker (2 wheeled) (bari) Transfers: Sit to/from Stand Sit to Stand: Supervision         General transfer comment: SUpervision for safety. Good demo of hand placement.   Ambulation/Gait Ambulation/Gait assistance: Supervision Ambulation Distance (Feet): 175 Feet Assistive device: Rolling walker (2 wheeled) Gait Pattern/deviations: Step-through pattern;Decreased stride length;Trunk flexed Gait velocity: decreased   General Gait Details: slow, steady gait; cues for posture, position of RW, and bilat heel strike   Stairs   Stairs assistance: Supervision Stair Management: Forwards;Two  rails Number of Stairs: 5 General stair comments: cues for sequencing  Wheelchair Mobility    Modified Rankin (Stroke Patients Only)       Balance Overall balance assessment: Needs assistance Sitting-balance support: Feet supported;No upper extremity supported Sitting balance-Leahy Scale: Good     Standing balance support: No upper extremity supported;During functional activity Standing balance-Leahy Scale: Fair Standing balance comment: Able to stand at sink and wash hands without UE support                    Cognition Arousal/Alertness: Awake/alert Behavior During Therapy: WFL for tasks assessed/performed Overall Cognitive Status: Within Functional Limits for tasks assessed                      Exercises      General Comments        Pertinent Vitals/Pain Pain Assessment: Faces Faces Pain Scale: Hurts a little bit Pain Location: back Pain Descriptors / Indicators: Sore Pain Intervention(s): Monitored during session;Repositioned    Home Living                      Prior Function            PT Goals (current goals can now be found in the care plan section) Acute Rehab PT Goals Patient Stated Goal: return to PLOF Progress towards PT goals: Progressing toward goals    Frequency  Min 5X/week    PT Plan Current plan remains appropriate    Co-evaluation             End of Session Equipment Utilized During Treatment: Back brace;Gait belt Activity Tolerance: Patient tolerated treatment well Patient left: in bed;with call bell/phone within reach;with  bed alarm set;with SCD's reapplied     Time: 1348-1400 PT Time Calculation (min) (ACUTE ONLY): 12 min  Charges:  $Gait Training: 8-22 mins                    G Codes:      [place] April, PTA Pager: [phone]   01/08/2016, 2:26 PM

## 2016-01-08 NOTE — Care Management Note (Signed)
Case Management Note  Patient Details  Name: Kenneth Lee MRN: [id number] Date of Birth: 1945/12/25  Subjective/Objective:                    Action/Plan: Plan is for patient to discharge home with [facility] services. CM met with the patient and provided him a list of [place] agencies in the [place] area. He selected [facility]. Butch Penny with [facility] notified and accepted the referral. Bedside RN updated.   Expected Discharge Date:                  Expected Discharge Plan:  [place]  In-House Referral:     Discharge planning Services  CM Consult  Post Acute Care Choice:  Durable Medical Equipment, Home Health Choice offered to:  Patient  DME Arranged:  Hospital bed DME Agency:  [place]:  PT, OT [facility] Agency:  [place]  Status of Service:  Completed, signed off  If discussed at [place] of Stay Meetings, dates discussed:    Additional Comments:  Pollie Friar, RN 01/08/2016, 4:16 PM

## 2016-01-08 NOTE — Progress Notes (Signed)
Occupational Therapy Treatment Patient Details Name: Kenneth Lee MRN: [id number] DOB: 07-29-1945 Today's Date: 01/08/2016    History of present illness Patient is a 70 y/o male with hx of CAD, HTN, HLD, DM presents s/p L2-3 PLIF, attaching to L3-5 fusion.   OT comments  Pt making good progress toward OT goals this session. Able to perform toilet transfer and grooming in standing with supervision for safety. Pt able to perform tub transfer with min guard assist. D/c plan remains appropriate. Will continue to follow acutely.   Follow Up Recommendations  Home health OT;Supervision/Assistance - 24 hour (initially)    Equipment Recommendations  3 in 1 bedside comode    Recommendations for Other Services      Precautions / Restrictions Precautions Precautions: Back Precaution Booklet Issued: No Precaution Comments: Able to recall back precautions and maintain throughout session. Required Braces or Orthoses: Spinal Brace Spinal Brace: Lumbar corset;Applied in sitting position Restrictions Weight Bearing Restrictions: No       Mobility Bed Mobility               General bed mobility comments: Pt OOB in chair upon arrival.  Transfers Overall transfer level: Needs assistance Equipment used: Rolling walker (2 wheeled) Transfers: Sit to/from Stand Sit to Stand: Supervision         General transfer comment: Supervision for safety; no physical assist required. Good hand placement and technique.    Balance Overall balance assessment: Needs assistance Sitting-balance support: Feet supported;No upper extremity supported Sitting balance-Leahy Scale: Good     Standing balance support: No upper extremity supported;During functional activity Standing balance-Leahy Scale: Fair Standing balance comment: Able to stand at sink and wash hands without UE support                   ADL Overall ADL's : Needs assistance/impaired     Grooming:  Supervision/safety;Standing;Wash/dry hands                   Toilet Transfer: Supervision/safety;Ambulation;RW;Comfort height toilet   Toileting- Clothing Manipulation and Hygiene: Supervision/safety;Sit to/from stand   Tub/ Shower Transfer: Min guard;Tub transfer;Ambulation;Shower seat;Rolling walker   Functional mobility during ADLs: Supervision/safety;Rolling walker General ADL Comments: Discussed home safety and maintaining back precautions during functional activities. Pt reports he recalls information from prior back sx.      Vision                     Perception     Praxis      Cognition   Behavior During Therapy: WFL for tasks assessed/performed Overall Cognitive Status: Within Functional Limits for tasks assessed                       Extremity/Trunk Assessment               Exercises     Shoulder Instructions       General Comments      Pertinent Vitals/ Pain       Pain Assessment: Faces Faces Pain Scale: Hurts a little bit Pain Location: back Pain Descriptors / Indicators: Sore Pain Intervention(s): Monitored during session  Home Living                                          Prior Functioning/Environment  Frequency Min 2X/week     Progress Toward Goals  OT Goals(current goals can now be found in the care plan section)  Progress towards OT goals: Progressing toward goals  Acute Rehab OT Goals Patient Stated Goal: return to PLOF OT Goal Formulation: With patient  Plan Discharge plan remains appropriate    Co-evaluation                 End of Session Equipment Utilized During Treatment: Rolling walker;Back brace   Activity Tolerance Patient tolerated treatment well   Patient Left Other (comment) (with PT)   Nurse Communication Other (comment) (RN observed hallway mobility)        Time: 1339-1350 OT Time Calculation (min): 11 min  Charges: OT General  Charges $OT Visit: 1 Procedure OT Treatments $Self Care/Home Management : 8-22 mins  Binnie Kand M.S., OTR/L Pager: [phone]  01/08/2016, 1:59 PM

## 2016-01-08 NOTE — Discharge Summary (Signed)
Physician Discharge Summary  Patient ID: Kenneth Lee MRN: [id number] DOB/AGE: [birth year] 70 y.o.  Admit date: 01/05/2016 Discharge date: 01/08/2016  Admission Diagnoses:Lumbar stenosis L2-3 status post decompression and fusion L3-L5, diabetes mellitus  Discharge Diagnoses: Lumbar stenosis L2-L3 status post decompression and fusion L3-L5, diabetes mellitus Active Problems:   Lumbar stenosis   Discharged Condition: fair  Hospital Course: Patient admitted for surgery which she tolerated well. He is tolerating activities reasonably well. He is discharged home.  Consults: None  Significant Diagnostic Studies: None  Treatments: surgery: Decompression L2-3 with arthrodesis L2-L3.  Discharge Exam: Blood pressure 133/46, pulse 61, temperature 101.2 F (38.4 C), temperature source Oral, resp. rate 20, height 5\' 10"  (1.778 m), weight 121.1 kg (266 lb 15.6 oz), SpO2 95 %. Incision is clean and dry, motor function is intact.  Disposition: 06-Home-Health Care S[facility]  Discharge Instructions    Call MD for:  redness, tenderness, or signs of infection (pain, swelling, redness, odor or green/yellow discharge around incision site)    Complete by:  As directed      Call MD for:  severe uncontrolled pain    Complete by:  As directed      Call MD for:  temperature >100.4    Complete by:  As directed      Diet - low sodium heart healthy    Complete by:  As directed      Discharge instructions    Complete by:  As directed   Okay to shower. Do not apply salves or appointments to incision. No heavy lifting with the upper extremities greater than 15 pounds. May resume driving when not requiring pain medication and patient feels comfortable with doing so.     Increase activity slowly    Complete by:  As directed             Medication List    TAKE these medications        amLODipine 5 MG tablet  Commonly known as:  NORVASC  TAKE 1 TABLET (5 MG TOTAL) BY MOUTH DAILY.     aspirin EC 325 MG  tablet  Take 325 mg by mouth daily.     BYSTOLIC 5 MG tablet  Generic drug:  nebivolol  TAKE 1 TABLET EVERY DAY     CRESTOR 10 MG tablet  Generic drug:  rosuvastatin  TAKE 1 TABLET BY MOUTH EVERY DAY     Fish Oil 1000 MG Caps  Take 1,000 mg by mouth daily.     furosemide 40 MG tablet  Commonly known as:  LASIX  Take 1 tablet (40 mg total) by mouth daily.     hydrALAZINE 25 MG tablet  Commonly known as:  APRESOLINE  TAKE 1 TABLET BY MOUTH 3 TIMES A DAY     HYDROcodone-acetaminophen 10-325 MG tablet  Commonly known as:  NORCO  Take 1 tablet by mouth every 6 (six) hours as needed. for pain     lisinopril 20 MG tablet  Commonly known as:  PRINIVIL,ZESTRIL  TAKE 1 TABLET (20 MG TOTAL) BY MOUTH DAILY.     meloxicam 15 MG tablet  Commonly known as:  MOBIC  Take 15 mg by mouth daily.     metFORMIN 500 MG tablet  Commonly known as:  GLUCOPHAGE  Take 500 mg by mouth daily with breakfast.     methocarbamol 500 MG tablet  Commonly known as:  ROBAXIN  Take 1 tablet (500 mg total) by mouth every 6 (six) hours as needed for  muscle spasms.     multivitamin with minerals Tabs tablet  Take 1 tablet by mouth daily.     nitroGLYCERIN 0.4 MG SL tablet  Commonly known as:  NITROSTAT  Place 0.4 mg under the tongue every 5 (five) minutes as needed for chest pain.     oxyCODONE-acetaminophen 5-325 MG tablet  Commonly known as:  PERCOCET/ROXICET  Take 1-2 tablets by mouth every 4 (four) hours as needed for moderate pain.           Follow-up Information    Follow up with [facility].   Why:  They will contact you for first visit.    Contact information:   [place] [address] [phone]       Signed: Earleen Newport 01/08/2016, 9:00 PM

## 2016-01-09 NOTE — Progress Notes (Signed)
Patient ready for discharge to home; discharge instructions given and reviewed;Rx given.  Patient discharged out via wheelchair accompanied by friend.

## 2016-01-10 DIAGNOSIS — Z4789 Encounter for other orthopedic aftercare: Secondary | ICD-10-CM | POA: Diagnosis not present

## 2016-01-10 DIAGNOSIS — E1142 Type 2 diabetes mellitus with diabetic polyneuropathy: Secondary | ICD-10-CM | POA: Diagnosis not present

## 2016-01-10 DIAGNOSIS — Z87891 Personal history of nicotine dependence: Secondary | ICD-10-CM | POA: Diagnosis not present

## 2016-01-10 DIAGNOSIS — Z7982 Long term (current) use of aspirin: Secondary | ICD-10-CM | POA: Diagnosis not present

## 2016-01-10 DIAGNOSIS — I10 Essential (primary) hypertension: Secondary | ICD-10-CM | POA: Diagnosis not present

## 2016-01-10 DIAGNOSIS — G4733 Obstructive sleep apnea (adult) (pediatric): Secondary | ICD-10-CM | POA: Diagnosis not present

## 2016-01-10 DIAGNOSIS — M150 Primary generalized (osteo)arthritis: Secondary | ICD-10-CM | POA: Diagnosis not present

## 2016-01-10 DIAGNOSIS — Z955 Presence of coronary angioplasty implant and graft: Secondary | ICD-10-CM | POA: Diagnosis not present

## 2016-01-10 DIAGNOSIS — I2510 Atherosclerotic heart disease of native coronary artery without angina pectoris: Secondary | ICD-10-CM | POA: Diagnosis not present

## 2016-01-10 DIAGNOSIS — Z7984 Long term (current) use of oral hypoglycemic drugs: Secondary | ICD-10-CM | POA: Diagnosis not present

## 2016-01-10 DIAGNOSIS — E785 Hyperlipidemia, unspecified: Secondary | ICD-10-CM | POA: Diagnosis not present

## 2016-01-11 ENCOUNTER — Emergency Department (HOSPITAL_COMMUNITY): Payer: Medicare Other

## 2016-01-11 ENCOUNTER — Encounter (HOSPITAL_COMMUNITY): Payer: Self-pay

## 2016-01-11 ENCOUNTER — Emergency Department (HOSPITAL_COMMUNITY)
Admission: EM | Admit: 2016-01-11 | Discharge: 2016-01-11 | Disposition: A | Discharge status: Home / Self Care | Payer: Medicare Other | Attending: Emergency Medicine | Admitting: Emergency Medicine

## 2016-01-11 DIAGNOSIS — Z79899 Other long term (current) drug therapy: Secondary | ICD-10-CM | POA: Diagnosis not present

## 2016-01-11 DIAGNOSIS — E1140 Type 2 diabetes mellitus with diabetic neuropathy, unspecified: Secondary | ICD-10-CM | POA: Insufficient documentation

## 2016-01-11 DIAGNOSIS — I10 Essential (primary) hypertension: Secondary | ICD-10-CM | POA: Diagnosis not present

## 2016-01-11 DIAGNOSIS — Z7984 Long term (current) use of oral hypoglycemic drugs: Secondary | ICD-10-CM | POA: Insufficient documentation

## 2016-01-11 DIAGNOSIS — I2510 Atherosclerotic heart disease of native coronary artery without angina pectoris: Secondary | ICD-10-CM | POA: Diagnosis not present

## 2016-01-11 DIAGNOSIS — Z7982 Long term (current) use of aspirin: Secondary | ICD-10-CM | POA: Diagnosis not present

## 2016-01-11 DIAGNOSIS — Z87891 Personal history of nicotine dependence: Secondary | ICD-10-CM | POA: Insufficient documentation

## 2016-01-11 DIAGNOSIS — R0789 Other chest pain: Secondary | ICD-10-CM | POA: Diagnosis not present

## 2016-01-11 DIAGNOSIS — H66002 Acute suppurative otitis media without spontaneous rupture of ear drum, left ear: Secondary | ICD-10-CM | POA: Diagnosis not present

## 2016-01-11 DIAGNOSIS — H66005 Acute suppurative otitis media without spontaneous rupture of ear drum, recurrent, left ear: Secondary | ICD-10-CM | POA: Diagnosis not present

## 2016-01-11 DIAGNOSIS — R0602 Shortness of breath: Secondary | ICD-10-CM | POA: Diagnosis not present

## 2016-01-11 DIAGNOSIS — R079 Chest pain, unspecified: Secondary | ICD-10-CM | POA: Diagnosis not present

## 2016-01-11 LAB — CBC
HCT: 36.9 % — ABNORMAL LOW (ref 39.0–52.0)
Hemoglobin: 12.5 g/dL — ABNORMAL LOW (ref 13.0–17.0)
MCH: 32 pg (ref 26.0–34.0)
MCHC: 33.9 g/dL (ref 30.0–36.0)
MCV: 94.4 fL (ref 78.0–100.0)
Platelets: 231 10*3/uL (ref 150–400)
RBC: 3.91 MIL/uL — ABNORMAL LOW (ref 4.22–5.81)
RDW: 12.6 % (ref 11.5–15.5)
WBC: 10.5 10*3/uL (ref 4.0–10.5)

## 2016-01-11 LAB — BASIC METABOLIC PANEL
Anion gap: 10 (ref 5–15)
BUN: 17 mg/dL (ref 6–20)
CO2: 24 mmol/L (ref 22–32)
Calcium: 9.5 mg/dL (ref 8.9–10.3)
Chloride: 100 mmol/L — ABNORMAL LOW (ref 101–111)
Creatinine, Ser: 0.98 mg/dL (ref 0.61–1.24)
GFR calc Af Amer: >60 mL/min (ref >60)
GFR calc non Af Amer: >60 mL/min (ref >60)
Glucose, Bld: 107 mg/dL — ABNORMAL HIGH (ref 65–99)
Potassium: 3.8 mmol/L (ref 3.5–5.1)
Sodium: 134 mmol/L — ABNORMAL LOW (ref 135–145)

## 2016-01-11 LAB — I-STAT TROPONIN, ED: Troponin i, poc: 0.01 ng/mL (ref 0.00–0.08)

## 2016-01-11 MED ORDER — AZITHROMYCIN 250 MG PO TABS: 250.0000 mg | ORAL_TABLET | Freq: Every day | ORAL | Status: DC

## 2016-01-11 MED ORDER — AZITHROMYCIN 250 MG PO TABS
500.0000 mg | ORAL_TABLET | Freq: Once | ORAL | Status: AC
  Administered 2016-01-11: 500 mg via ORAL
  Filled 2016-01-11: qty 2

## 2016-01-11 NOTE — ED Provider Notes (Signed)
CSN: [id number]     Arrival date & time 01/11/16  0112 History  By signing my name below, I, Kenneth Lee, attest that this documentation has been prepared under the direction and in the presence of Everlene Balls, MD . Electronically Signed: Evelene Lee, Scribe. 01/11/2016. 1:57 AM.   Chief Complaint  Patient presents with  . Chest Pain    The history is provided by the patient and medical records. No language interpreter was used.     HPI Comments:  Kenneth Lee is a 70 y.o. male who is 6 days post-op, presenting to the Emergency Department complaining of an episode of chest tightness this evening with associated SOB. Pt notes his symptoms began while at rest; states he suddenly felt woozy. He also describes a thumping sensation in his left ear into his neck. Pt complained of left ear pain following his surgery and was placed on ear drops which he has been using for ~ 5 days. He notes h/o swimmers ear diagnosed ~ 10 months ago. Pt has a h/o CAD and a coronary stent; states at that time he only experienced pain in his LUE, no CP.   He denies vomiting, diaphoresis, fever, and cough. Pt had a  Lumbar Two-Three Posterior Lumbar Interbody Fusion, attaching to L3-5 fusion on 01/05/16. No alleviating factors noted.  Elsner- Neurosurgeon   Past Medical History  Diagnosis Date  . Sleep apnea   . Hyperlipidemia     takes Crestor daily  . Other specified cardiac dysrhythmias(427.89)   . Sinoatrial node dysfunction (H[facility])   . Carotid disease, bilateral ([place])   . Coronary artery disease     with bare mental stent 2008 and RCA is Card   Dr.Smith  . Diabetes mellitus without complication ([place])     takes Metformin daily  . Hypertension     takes Amlodipine,Bystolic,Apresoline,and Lisinopril daily.   . Peripheral edema     takes Lasix daily  . Headache     occasionally  . Weakness     numbness and tingling,left leg  . Peripheral neuropathy ([place])   . Arthritis   . Joint pain   . Chronic back  pain     stenosis  . History of colon polyps     benign   Past Surgical History  Procedure Laterality Date  . Discectomy  20 yrs ago  . Back surgery  2016    fusion  . Cardiac catheterization  2008  . Coronary angioplasty      1 stent  . Cholecystectomy  2001  . Aortobifemoral bypass  1996  . Colonoscopy    . Esophagogastroduodenoscopy     Family History  Problem Relation Age of Onset  . Heart disease Mother   . Cancer Mother   . Hypertension Other   . Hyperlipidemia Other   . Heart attack Mother   . Stroke Neg Hx   . Hypertension Mother   . Hypertension Father    Social History  Substance Use Topics  . Smoking status: Former Research scientist (life sciences)  . Smokeless tobacco: None     Comment: history of smoking-quit a yr ago  . Alcohol Use: Yes     Comment: socially    Review of Systems  10 systems reviewed and all are negative for acute change except as noted in the HPI.  Allergies  Penicillins  Home Medications   Prior to Admission medications   Medication Sig Start Date End Date Taking? Authorizing Provider  amLODipine (NORVASC) 5 MG tablet  TAKE 1 TABLET (5 MG TOTAL) BY MOUTH DAILY. 08/31/15  Yes Belva Crome, MD  aspirin EC 325 MG tablet Take 325 mg by mouth daily.    Yes Historical Provider, MD  BYSTOLIC 5 MG tablet TAKE 1 TABLET EVERY DAY 10/13/15  Yes Belva Crome, MD  CRESTOR 10 MG tablet TAKE 1 TABLET BY MOUTH EVERY DAY 12/04/15  Yes Belva Crome, MD  furosemide (LASIX) 40 MG tablet Take 1 tablet (40 mg total) by mouth daily. 09/30/15  Yes Belva Crome, MD  hydrALAZINE (APRESOLINE) 25 MG tablet TAKE 1 TABLET BY MOUTH 3 TIMES A DAY 12/04/15  Yes Belva Crome, MD  HYDROcodone-acetaminophen [facility]) 10-325 MG tablet Take 1 tablet by mouth every 6 (six) hours as needed. for pain 12/09/15  Yes Historical Provider, MD  lisinopril (PRINIVIL,ZESTRIL) 20 MG tablet TAKE 1 TABLET (20 MG TOTAL) BY MOUTH DAILY. 10/05/15  Yes Belva Crome, MD  meloxicam (MOBIC) 15 MG tablet Take 15 mg by  mouth daily. 08/04/15  Yes Historical Provider, MD  metFORMIN (GLUCOPHAGE) 500 MG tablet Take 500 mg by mouth daily with breakfast.  07/08/14  Yes Historical Provider, MD  methocarbamol (ROBAXIN) 500 MG tablet Take 1 tablet (500 mg total) by mouth every 6 (six) hours as needed for muscle spasms. 01/08/16  Yes Kristeen Miss, MD  Multiple Vitamin (MULTIVITAMIN WITH MINERALS) TABS tablet Take 1 tablet by mouth daily.   Yes Historical Provider, MD  nitroGLYCERIN (NITROSTAT) 0.4 MG SL tablet Place 0.4 mg under the tongue every 5 (five) minutes as needed for chest pain.   Yes Historical Provider, MD  Omega-3 Fatty Acids (FISH OIL) 1000 MG CAPS Take 1,000 mg by mouth daily.    Yes Historical Provider, MD  oxyCODONE-acetaminophen (PERCOCET/ROXICET) 5-325 MG tablet Take 1-2 tablets by mouth every 4 (four) hours as needed for moderate pain. 01/08/16  Yes Kristeen Miss, MD   There were no vitals taken for this visit. Physical Exam  Constitutional: He is oriented to person, place, and time. Vital signs are normal. He appears well-developed and well-nourished.  Non-toxic appearance. He does not appear ill. No distress.  HENT:  Head: Normocephalic and atraumatic.  Nose: Nose normal.  Mouth/Throat: Oropharynx is clear and moist. No oropharyngeal exudate.  Purulence behind  left ear  Eyes: Conjunctivae and EOM are normal. Pupils are equal, round, and reactive to light. No scleral icterus.  Neck: Normal range of motion. Neck supple. No tracheal deviation, no edema, no erythema and normal range of motion present. No thyroid mass and no thyromegaly present.  Cardiovascular: Normal rate, regular rhythm, S1 normal, S2 normal, normal heart sounds, intact distal pulses and normal pulses.  Exam reveals no gallop and no friction rub.   No murmur heard. Pulmonary/Chest: Effort normal and breath sounds normal. No respiratory distress. He has no wheezes. He has no rhonchi. He has no rales.  Abdominal: Soft. Normal appearance  and bowel sounds are normal. He exhibits no distension, no ascites and no mass. There is no hepatosplenomegaly. There is no tenderness. There is no rebound, no guarding and no CVA tenderness.  Musculoskeletal: Normal range of motion. He exhibits edema. He exhibits no tenderness.  BLE edema   Lymphadenopathy:    He has no cervical adenopathy.  Neurological: He is alert and oriented to person, place, and time. He has normal strength. No cranial nerve deficit or sensory deficit.  Skin: Skin is warm, dry and intact. No petechiae and no rash noted. He is  not diaphoretic. No erythema. No pallor.  Psychiatric: He has a normal mood and affect. His behavior is normal. Judgment normal.  Nursing note and vitals reviewed.   ED Course  Procedures   DIAGNOSTIC STUDIES:  Oxygen Saturation is 95% on RA, adequate by my interpretation.    COORDINATION OF CARE:  1:45 AM Discussed treatment plan with pt at bedside and pt agreed to plan.  Labs Review Labs Reviewed  BASIC METABOLIC PANEL - Abnormal; Notable for the following:    Sodium 134 (*)    Chloride 100 (*)    Glucose, Bld 107 (*)    All other components within normal limits  CBC - Abnormal; Notable for the following:    RBC 3.91 (*)    Hemoglobin 12.5 (*)    HCT 36.9 (*)    All other components within normal limits  I-STAT TROPOININ, ED    Imaging Review Dg Chest 2 View  01/11/2016  CLINICAL DATA:  Chest pain and tightness. EXAM: CHEST  2 VIEW COMPARISON:  Most recent chest imaging CT 01/25/2008 FINDINGS: The cardiomediastinal contours are normal. Probable hiatal hernia. Borderline mild hyperinflation. Pulmonary vasculature is normal. No consolidation, pleural effusion, or pneumothorax. No acute osseous abnormalities are seen. IMPRESSION: No acute pulmonary process. Electronically Signed   By: Jeb Levering M.D.   On: 01/11/2016 02:42   I have personally reviewed and evaluated these images and lab results as part of my medical  decision-making.   EKG Interpretation   Date/Time:  Monday January 11 2016 01:17:20 EDT Ventricular Rate:  68 PR Interval:  200 QRS Duration: 90 QT Interval:  400 QTC Calculation: 425 R Axis:   27 Text Interpretation:  Normal sinus rhythm Slow R progression, new v.  04/2015 Abnormal ECG Confirmed by Jeneen Rinks  MD, [place] ([id number]) on 01/11/2016  1:24:53 AM      MDM   Final diagnoses:  None    Patient presents to the ED for ear pain.  He states this feels like his last swimmers ear infection.  PE does reveal purulence and fluid behind the drum.  He has an allergy to PCN so will treat with azithro.  ENT follow up provided.  Cardiac work up is negative in the ED.  His history was not consistent with ACS, orders were from triage and not clinically indicated.  He appears well and in NAD. VS remain within his normal limits and he is safe for DC.  I personally performed the services described in this documentation, which was scribed in my presence. The recorded information has been reviewed and is accurate.      Everlene Balls, MD 01/11/16 [phone]

## 2016-01-11 NOTE — ED Notes (Signed)
Patient transported to CT 

## 2016-01-11 NOTE — ED Notes (Signed)
Patient transported to X-ray 

## 2016-01-11 NOTE — ED Notes (Signed)
Pt states 2 days post op, new onset chest pain and left arm pain. Pt complaining of dizziness and weakness since 6pm 01/10/16. Denies any cough/fevers.

## 2016-01-11 NOTE — ED Notes (Signed)
Dr. Oni at bedside. 

## 2016-01-11 NOTE — Discharge Instructions (Signed)
Otitis Media, Adult Kenneth Lee, take antibiotics as directed for your ear infection.  See ENT within 3 days for close follow up. If symptoms worsen, come back to the ED immediately. Thank you. Otitis media is redness, soreness, and puffiness (swelling) in the space just behind your eardrum (middle ear). It may be caused by allergies or infection. It often happens along with a cold. HOME CARE  Take your medicine as told. Finish it even if you start to feel better.  Only take over-the-counter or prescription medicines for pain, discomfort, or fever as told by your doctor.  Follow up with your doctor as told. GET HELP IF:  You have otitis media only in one ear, or bleeding from your nose, or both.  You notice a lump on your neck.  You are not getting better in 3-5 days.  You feel worse instead of better. GET HELP RIGHT AWAY IF:   You have pain that is not helped with medicine.  You have puffiness, redness, or pain around your ear.  You get a stiff neck.  You cannot move part of your face (paralysis).  You notice that the bone behind your ear hurts when you touch it. MAKE SURE YOU:   Understand these instructions.  Will watch your condition.  Will get help right away if you are not doing well or get worse.   This information is not intended to replace advice given to you by your health care provider. Make sure you discuss any questions you have with your health care provider.   Document Released: 12/14/2007 Document Revised: 07/18/2014 Document Reviewed: 01/22/2013 Elsevier Interactive Patient Education [employer].

## 2016-01-13 DIAGNOSIS — I2510 Atherosclerotic heart disease of native coronary artery without angina pectoris: Secondary | ICD-10-CM | POA: Diagnosis not present

## 2016-01-13 DIAGNOSIS — E785 Hyperlipidemia, unspecified: Secondary | ICD-10-CM | POA: Diagnosis not present

## 2016-01-13 DIAGNOSIS — E1142 Type 2 diabetes mellitus with diabetic polyneuropathy: Secondary | ICD-10-CM | POA: Diagnosis not present

## 2016-01-13 DIAGNOSIS — M150 Primary generalized (osteo)arthritis: Secondary | ICD-10-CM | POA: Diagnosis not present

## 2016-01-13 DIAGNOSIS — I10 Essential (primary) hypertension: Secondary | ICD-10-CM | POA: Diagnosis not present

## 2016-01-13 DIAGNOSIS — Z4789 Encounter for other orthopedic aftercare: Secondary | ICD-10-CM | POA: Diagnosis not present

## 2016-01-14 DIAGNOSIS — J310 Chronic rhinitis: Secondary | ICD-10-CM | POA: Diagnosis not present

## 2016-01-14 DIAGNOSIS — H66002 Acute suppurative otitis media without spontaneous rupture of ear drum, left ear: Secondary | ICD-10-CM | POA: Diagnosis not present

## 2016-01-14 DIAGNOSIS — J342 Deviated nasal septum: Secondary | ICD-10-CM | POA: Diagnosis not present

## 2016-01-15 DIAGNOSIS — M150 Primary generalized (osteo)arthritis: Secondary | ICD-10-CM | POA: Diagnosis not present

## 2016-01-15 DIAGNOSIS — E785 Hyperlipidemia, unspecified: Secondary | ICD-10-CM | POA: Diagnosis not present

## 2016-01-15 DIAGNOSIS — I10 Essential (primary) hypertension: Secondary | ICD-10-CM | POA: Diagnosis not present

## 2016-01-15 DIAGNOSIS — Z4789 Encounter for other orthopedic aftercare: Secondary | ICD-10-CM | POA: Diagnosis not present

## 2016-01-15 DIAGNOSIS — I2510 Atherosclerotic heart disease of native coronary artery without angina pectoris: Secondary | ICD-10-CM | POA: Diagnosis not present

## 2016-01-15 DIAGNOSIS — E1142 Type 2 diabetes mellitus with diabetic polyneuropathy: Secondary | ICD-10-CM | POA: Diagnosis not present

## 2016-01-18 DIAGNOSIS — H6690 Otitis media, unspecified, unspecified ear: Secondary | ICD-10-CM | POA: Diagnosis not present

## 2016-01-18 DIAGNOSIS — E1165 Type 2 diabetes mellitus with hyperglycemia: Secondary | ICD-10-CM | POA: Diagnosis not present

## 2016-01-18 DIAGNOSIS — E7800 Pure hypercholesterolemia, unspecified: Secondary | ICD-10-CM | POA: Diagnosis not present

## 2016-01-18 DIAGNOSIS — G4730 Sleep apnea, unspecified: Secondary | ICD-10-CM | POA: Diagnosis not present

## 2016-01-18 DIAGNOSIS — Z7984 Long term (current) use of oral hypoglycemic drugs: Secondary | ICD-10-CM | POA: Diagnosis not present

## 2016-01-18 DIAGNOSIS — M4806 Spinal stenosis, lumbar region: Secondary | ICD-10-CM | POA: Diagnosis not present

## 2016-01-19 DIAGNOSIS — I2510 Atherosclerotic heart disease of native coronary artery without angina pectoris: Secondary | ICD-10-CM | POA: Diagnosis not present

## 2016-01-19 DIAGNOSIS — E1142 Type 2 diabetes mellitus with diabetic polyneuropathy: Secondary | ICD-10-CM | POA: Diagnosis not present

## 2016-01-19 DIAGNOSIS — E785 Hyperlipidemia, unspecified: Secondary | ICD-10-CM | POA: Diagnosis not present

## 2016-01-19 DIAGNOSIS — Z4789 Encounter for other orthopedic aftercare: Secondary | ICD-10-CM | POA: Diagnosis not present

## 2016-01-19 DIAGNOSIS — I10 Essential (primary) hypertension: Secondary | ICD-10-CM | POA: Diagnosis not present

## 2016-01-19 DIAGNOSIS — M150 Primary generalized (osteo)arthritis: Secondary | ICD-10-CM | POA: Diagnosis not present

## 2016-01-21 DIAGNOSIS — M4806 Spinal stenosis, lumbar region: Secondary | ICD-10-CM | POA: Diagnosis not present

## 2016-01-21 DIAGNOSIS — I10 Essential (primary) hypertension: Secondary | ICD-10-CM | POA: Diagnosis not present

## 2016-01-21 DIAGNOSIS — Z6836 Body mass index (BMI) 36.0-36.9, adult: Secondary | ICD-10-CM | POA: Diagnosis not present

## 2016-01-22 DIAGNOSIS — Z4789 Encounter for other orthopedic aftercare: Secondary | ICD-10-CM | POA: Diagnosis not present

## 2016-01-22 DIAGNOSIS — I2510 Atherosclerotic heart disease of native coronary artery without angina pectoris: Secondary | ICD-10-CM | POA: Diagnosis not present

## 2016-01-22 DIAGNOSIS — M150 Primary generalized (osteo)arthritis: Secondary | ICD-10-CM | POA: Diagnosis not present

## 2016-01-22 DIAGNOSIS — E1142 Type 2 diabetes mellitus with diabetic polyneuropathy: Secondary | ICD-10-CM | POA: Diagnosis not present

## 2016-01-22 DIAGNOSIS — I10 Essential (primary) hypertension: Secondary | ICD-10-CM | POA: Diagnosis not present

## 2016-01-22 DIAGNOSIS — E785 Hyperlipidemia, unspecified: Secondary | ICD-10-CM | POA: Diagnosis not present

## 2016-01-26 ENCOUNTER — Telehealth: Payer: Self-pay | Admitting: Interventional Cardiology

## 2016-01-26 DIAGNOSIS — Z4789 Encounter for other orthopedic aftercare: Secondary | ICD-10-CM | POA: Diagnosis not present

## 2016-01-26 DIAGNOSIS — E1142 Type 2 diabetes mellitus with diabetic polyneuropathy: Secondary | ICD-10-CM | POA: Diagnosis not present

## 2016-01-26 DIAGNOSIS — E785 Hyperlipidemia, unspecified: Secondary | ICD-10-CM | POA: Diagnosis not present

## 2016-01-26 DIAGNOSIS — M150 Primary generalized (osteo)arthritis: Secondary | ICD-10-CM | POA: Diagnosis not present

## 2016-01-26 DIAGNOSIS — I10 Essential (primary) hypertension: Secondary | ICD-10-CM

## 2016-01-26 DIAGNOSIS — I2510 Atherosclerotic heart disease of native coronary artery without angina pectoris: Secondary | ICD-10-CM | POA: Diagnosis not present

## 2016-01-26 NOTE — Telephone Encounter (Signed)
New Message:    Please call,wants to give you blood pressure readings for the past week and discuss this with you.

## 2016-01-26 NOTE — Telephone Encounter (Signed)
[name] from [facility] calling with last 5 BP readings.  On 7/5 168/80       7/7 162/62       7/11 160/78        7/14 172/80        7/18 162/68 HR 47 His biggest concern is that BP hasn't decrease with adding hydralazine and that he has been having a "throbbing sensation in his head". Advised Dr. Tamala Julian is in hospital tomorrow and will be in office Thursday. Pt would like a call back as soon as possible.  Will forward message to him and his nurse Lattie Haw Parris-Godley,CMA.

## 2016-01-26 NOTE — Telephone Encounter (Signed)
[name] from [facility] calling to give some BP readings.  States she is driving and can't see readings and will call back.

## 2016-01-28 DIAGNOSIS — E785 Hyperlipidemia, unspecified: Secondary | ICD-10-CM | POA: Diagnosis not present

## 2016-01-28 DIAGNOSIS — I10 Essential (primary) hypertension: Secondary | ICD-10-CM | POA: Diagnosis not present

## 2016-01-28 DIAGNOSIS — M150 Primary generalized (osteo)arthritis: Secondary | ICD-10-CM | POA: Diagnosis not present

## 2016-01-28 DIAGNOSIS — I2510 Atherosclerotic heart disease of native coronary artery without angina pectoris: Secondary | ICD-10-CM | POA: Diagnosis not present

## 2016-01-28 DIAGNOSIS — Z4789 Encounter for other orthopedic aftercare: Secondary | ICD-10-CM | POA: Diagnosis not present

## 2016-01-28 DIAGNOSIS — E1142 Type 2 diabetes mellitus with diabetic polyneuropathy: Secondary | ICD-10-CM | POA: Diagnosis not present

## 2016-02-02 DIAGNOSIS — E1142 Type 2 diabetes mellitus with diabetic polyneuropathy: Secondary | ICD-10-CM | POA: Diagnosis not present

## 2016-02-02 DIAGNOSIS — Z4789 Encounter for other orthopedic aftercare: Secondary | ICD-10-CM | POA: Diagnosis not present

## 2016-02-02 DIAGNOSIS — I10 Essential (primary) hypertension: Secondary | ICD-10-CM | POA: Diagnosis not present

## 2016-02-02 DIAGNOSIS — E785 Hyperlipidemia, unspecified: Secondary | ICD-10-CM | POA: Diagnosis not present

## 2016-02-02 DIAGNOSIS — M150 Primary generalized (osteo)arthritis: Secondary | ICD-10-CM | POA: Diagnosis not present

## 2016-02-02 DIAGNOSIS — I2510 Atherosclerotic heart disease of native coronary artery without angina pectoris: Secondary | ICD-10-CM | POA: Diagnosis not present

## 2016-02-03 MED ORDER — LISINOPRIL 20 MG PO TABS: 20.0000 mg | ORAL_TABLET | Freq: Two times a day (BID) | ORAL | 1 refills | Status: DC

## 2016-02-03 MED ORDER — ASPIRIN EC 81 MG PO TBEC: 81.0000 mg | DELAYED_RELEASE_TABLET | Freq: Every day | ORAL | Status: DC

## 2016-02-03 NOTE — Telephone Encounter (Signed)
He needs to increase the Lisinopril to 20 mg BID Decrease aspirin to 81 mg daily If BP remains high, will increase the amlodipine to 10 mg daily. BMET in 1 week. Report BP next week.

## 2016-02-03 NOTE — Telephone Encounter (Signed)
Pt aware of Dr.Smith's recommendation. He will increase Lisinopril to 20mg  bid. Rx sent to pt pharmacy. He will reduce Asa to 81mg  daily. Pt is scheduled to see Ermalinda Barrios, PA on 8/1. Pt will bring his BP reading with him to that appt. Pt will have a bmet drawn when he sees [place] on 8/1. Pt verbalized understanding to instruction given.

## 2016-02-04 DIAGNOSIS — M150 Primary generalized (osteo)arthritis: Secondary | ICD-10-CM | POA: Diagnosis not present

## 2016-02-04 DIAGNOSIS — I10 Essential (primary) hypertension: Secondary | ICD-10-CM | POA: Diagnosis not present

## 2016-02-04 DIAGNOSIS — Z4789 Encounter for other orthopedic aftercare: Secondary | ICD-10-CM | POA: Diagnosis not present

## 2016-02-04 DIAGNOSIS — I2510 Atherosclerotic heart disease of native coronary artery without angina pectoris: Secondary | ICD-10-CM | POA: Diagnosis not present

## 2016-02-04 DIAGNOSIS — E1142 Type 2 diabetes mellitus with diabetic polyneuropathy: Secondary | ICD-10-CM | POA: Diagnosis not present

## 2016-02-04 DIAGNOSIS — E785 Hyperlipidemia, unspecified: Secondary | ICD-10-CM | POA: Diagnosis not present

## 2016-02-09 ENCOUNTER — Encounter: Payer: Self-pay | Admitting: *Deleted

## 2016-02-09 ENCOUNTER — Other Ambulatory Visit: Payer: Medicare Other

## 2016-02-09 ENCOUNTER — Ambulatory Visit (INDEPENDENT_AMBULATORY_CARE_PROVIDER_SITE_OTHER): Payer: Medicare Other | Admitting: Physician Assistant

## 2016-02-09 ENCOUNTER — Encounter: Payer: Self-pay | Admitting: Physician Assistant

## 2016-02-09 VITALS — BP 178/70 | HR 58 | Ht 71.0 in | Wt 260.1 lb

## 2016-02-09 DIAGNOSIS — E785 Hyperlipidemia, unspecified: Secondary | ICD-10-CM

## 2016-02-09 DIAGNOSIS — I10 Essential (primary) hypertension: Secondary | ICD-10-CM | POA: Diagnosis not present

## 2016-02-09 DIAGNOSIS — I2510 Atherosclerotic heart disease of native coronary artery without angina pectoris: Secondary | ICD-10-CM

## 2016-02-09 DIAGNOSIS — E119 Type 2 diabetes mellitus without complications: Secondary | ICD-10-CM

## 2016-02-09 DIAGNOSIS — R0989 Other specified symptoms and signs involving the circulatory and respiratory systems: Secondary | ICD-10-CM | POA: Insufficient documentation

## 2016-02-09 LAB — BASIC METABOLIC PANEL
BUN: 15 mg/dL (ref 7–25)
CO2: 24 mmol/L (ref 20–31)
Calcium: 9 mg/dL (ref 8.6–10.3)
Chloride: 106 mmol/L (ref 98–110)
Creat: 0.86 mg/dL (ref 0.70–1.18)
Glucose, Bld: 135 mg/dL — ABNORMAL HIGH (ref 65–99)
Potassium: 4.2 mmol/L (ref 3.5–5.3)
Sodium: 139 mmol/L (ref 135–146)

## 2016-02-09 MED ORDER — HYDRALAZINE HCL 50 MG PO TABS: 50.0000 mg | ORAL_TABLET | Freq: Three times a day (TID) | ORAL | 1 refills | Status: DC

## 2016-02-09 NOTE — Patient Instructions (Addendum)
Medication Instructions:  Increase hydralazine to 50mg  three times a day. You can take 2 of your 25mg  tablets three times a day and use your current supply.  Labwork: BMET today  Testing/Procedures: Your physician has requested that you have a carotid duplex. This test is an ultrasound of the carotid arteries in your neck. It looks at blood flow through these arteries that supply the brain with blood. Allow one hour for this exam. There are no restrictions or special instructions.    Follow-Up: Your physician recommends that you schedule a follow-up appointment in: 4 months with Dr Tamala Julian.   Any Other Special Instructions Will Be Listed Below (If Applicable).   Your physician has requested that you regularly monitor and record your blood pressure readings at home. Please use the same machine at the same time of day to check your readings and record them. Call the office next week with your blood pressure readings. [phone]      Low-Sodium Eating Plan Sodium raises blood pressure and causes water to be held in the body. Getting less sodium from food will help lower your blood pressure, reduce any swelling, and protect your heart, liver, and kidneys. We get sodium by adding salt (sodium chloride) to food. Most of our sodium comes from canned, boxed, and frozen foods. Restaurant foods, fast foods, and pizza are also very high in sodium. Even if you take medicine to lower your blood pressure or to reduce fluid in your body, getting less sodium from your food is important. WHAT IS MY PLAN? Most people should limit their sodium intake to 2,300 mg a day. Your health care provider recommends that you limit your sodium intake to __________ a day.  WHAT DO I NEED TO KNOW ABOUT THIS EATING PLAN? For the low-sodium eating plan, you will follow these general guidelines:  Choose foods with a % Daily Value for sodium of less than 5% (as listed on the food label).   Use salt-free seasonings or  herbs instead of table salt or sea salt.   Check with your health care provider or pharmacist before using salt substitutes.   Eat fresh foods.  Eat more vegetables and fruits.  Limit canned vegetables. If you do use them, rinse them well to decrease the sodium.   Limit cheese to 1 oz (28 g) per day.   Eat lower-sodium products, often labeled as "lower sodium" or "no salt added."  Avoid foods that contain monosodium glutamate (MSG). MSG is sometimes added to [place] food and some canned foods.  Check food labels (Nutrition Facts labels) on foods to learn how much sodium is in one serving.  Eat more home-cooked food and less restaurant, buffet, and fast food.  When eating at a restaurant, ask that your food be prepared with less salt, or no salt if possible.  HOW DO I READ FOOD LABELS FOR SODIUM INFORMATION? The Nutrition Facts label lists the amount of sodium in one serving of the food. If you eat more than one serving, you must multiply the listed amount of sodium by the number of servings. Food labels may also identify foods as:  Sodium free--Less than 5 mg in a serving.  Very low sodium--35 mg or less in a serving.  Low sodium--140 mg or less in a serving.  Light in sodium--50% less sodium in a serving. For example, if a food that usually has 300 mg of sodium is changed to become light in sodium, it will have 150 mg of sodium.  Reduced sodium--25% less sodium in a serving. For example, if a food that usually has 400 mg of sodium is changed to reduced sodium, it will have 300 mg of sodium. WHAT FOODS CAN I EAT? Grains Low-sodium cereals, including oats, puffed wheat and rice, and shredded wheat cereals. Low-sodium crackers. Unsalted rice and pasta. Lower-sodium bread.  Vegetables Frozen or fresh vegetables. Low-sodium or reduced-sodium canned vegetables. Low-sodium or reduced-sodium tomato sauce and paste. Low-sodium or reduced-sodium tomato and vegetable juices.    Fruits Fresh, frozen, and canned fruit. Fruit juice.  Meat and Other Protein Products Low-sodium canned tuna and salmon. Fresh or frozen meat, poultry, seafood, and fish. Lamb. Unsalted nuts. Dried beans, peas, and lentils without added salt. Unsalted canned beans. Homemade soups without salt. Eggs.  Dairy Milk. Soy milk. Ricotta cheese. Low-sodium or reduced-sodium cheeses. Yogurt.  Condiments Fresh and dried herbs and spices. Salt-free seasonings. Onion and garlic powders. Low-sodium varieties of mustard and ketchup. Fresh or refrigerated horseradish. Lemon juice.  Fats and Oils Reduced-sodium salad dressings. Unsalted butter.  Other Unsalted popcorn and pretzels.  The items listed above may not be a complete list of recommended foods or beverages. Contact your dietitian for more options. WHAT FOODS ARE NOT RECOMMENDED? Grains Instant hot cereals. Bread stuffing, pancake, and biscuit mixes. Croutons. Seasoned rice or pasta mixes. Noodle soup cups. Boxed or frozen macaroni and cheese. Self-rising flour. Regular salted crackers. Vegetables Regular canned vegetables. Regular canned tomato sauce and paste. Regular tomato and vegetable juices. Frozen vegetables in sauces. Salted [place] fries. Olives. Angie Fava. Relishes. Sauerkraut. Salsa. Meat and Other Protein Products Salted, canned, smoked, spiced, or pickled meats, seafood, or fish. Bacon, ham, sausage, hot dogs, corned beef, chipped beef, and packaged luncheon meats. Salt pork. Jerky. Pickled herring. Anchovies, regular canned tuna, and sardines. Salted nuts. Dairy Processed cheese and cheese spreads. Cheese curds. Blue cheese and cottage cheese. Buttermilk.  Condiments Onion and garlic salt, seasoned salt, table salt, and sea salt. Canned and packaged gravies. Worcestershire sauce. Tartar sauce. Barbecue sauce. Teriyaki sauce. Soy sauce, including reduced sodium. Steak sauce. Fish sauce. Oyster sauce. Cocktail sauce.  Horseradish that you find on the shelf. Regular ketchup and mustard. Meat flavorings and tenderizers. Bouillon cubes. Hot sauce. Tabasco sauce. Marinades. Taco seasonings. Relishes. Fats and Oils Regular salad dressings. Salted butter. Margarine. Ghee. Bacon fat.  Other Potato and tortilla chips. Corn chips and puffs. Salted popcorn and pretzels. Canned or dried soups. Pizza. Frozen entrees and pot pies.  The items listed above may not be a complete list of foods and beverages to avoid. Contact your dietitian for more information.   This information is not intended to replace advice given to you by your health care provider. Make sure you discuss any questions you have with your health care provider.   Document Released: 12/17/2001 Document Revised: 07/18/2014 Document Reviewed: 05/01/2013 Elsevier Interactive Patient Education [employer].     If you need a refill on your cardiac medications before your next appointment, please call your pharmacy.

## 2016-02-09 NOTE — Progress Notes (Signed)
Cardiology Office Note    Date:  02/09/2016   ID:  Kenneth Lee, DOB [birth year], MRN [id number]  PCP:  Kandice Hams, MD  Cardiologist: Dr. Daneen Schick  Chief Complaint  Patient presents with  . Follow-up    pt states thumping in left ear     History of Present Illness:  Kenneth Lee is a 70 y.o. male with CAD status post BMS to the RCA in 2008, bilateral carotid disease, sinus node dysfunction, hypertension, hyperlipidemia and obstructive sleep apnea. Patient last saw Dr. Tamala Julian in February 2017 and was doing well.  Patient was in the ED on 01/11/16 with thumping in his ear,chest pain with shortness of breath and dizziness as well as swimmer's ear.. This was 6 days after lumbar surgery.troponins were negative and they didn't think his history was consistent with ACS.patient states he didn't have chest pain but only of the thumping in his left ear with associated high blood pressure. He called our office because his blood pressure was remaining elevated between [id number] and [id number] systolic. Lisinopril was increased to 20 mg twice a day 3 days ago. He is still having the thumping in his ear off and on and it's always associated with his blood pressure being high at home. He doesn't use salt but does eat a lot of packaged foods. He still having pain from his recent surgery and has difficulty getting around. Denies any chest pain.   Past Medical History:  Diagnosis Date  . Arthritis   . Carotid disease, bilateral ([place])   . Chronic back pain    stenosis  . Coronary artery disease    with bare mental stent 2008 and RCA is Card   Dr.Smith  . Diabetes mellitus without complication ([place])    takes Metformin daily  . Headache    occasionally  . History of colon polyps    benign  . Hyperlipidemia    takes Crestor daily  . Hypertension    takes Amlodipine,Bystolic,Apresoline,and Lisinopril daily.   . Joint pain   . Other specified cardiac dysrhythmias(427.89)   . Peripheral edema    takes Lasix daily  . Peripheral neuropathy ([place])   . Sinoatrial node dysfunction ([facility])   . Sleep apnea   . Weakness    numbness and tingling,left leg    Past Surgical History:  Procedure Laterality Date  . aortobifemoral bypass  1996  . BACK SURGERY  2016   fusion  . CARDIAC CATHETERIZATION  2008  . CHOLECYSTECTOMY  2001  . COLONOSCOPY    . CORONARY ANGIOPLASTY     1 stent  . discectomy  20 yrs ago  . ESOPHAGOGASTRODUODENOSCOPY      Current Medications: Outpatient Medications Prior to Visit  Medication Sig Dispense Refill  . amLODipine (NORVASC) 5 MG tablet TAKE 1 TABLET (5 MG TOTAL) BY MOUTH DAILY. 90 tablet 3  . aspirin EC 81 MG tablet Take 1 tablet (81 mg total) by mouth daily.    Marland Kitchen azithromycin (ZITHROMAX) 250 MG tablet Take 1 tablet (250 mg total) by mouth daily. 4 tablet 0  . BYSTOLIC 5 MG tablet TAKE 1 TABLET EVERY DAY 90 tablet 2  . CRESTOR 10 MG tablet TAKE 1 TABLET BY MOUTH EVERY DAY 30 tablet 8  . furosemide (LASIX) 40 MG tablet Take 1 tablet (40 mg total) by mouth daily. 90 tablet 3  . HYDROcodone-acetaminophen (NORCO) 10-325 MG tablet Take 1 tablet by mouth every 6 (six) hours as needed. for pain  0  .  lisinopril (PRINIVIL,ZESTRIL) 20 MG tablet Take 1 tablet (20 mg total) by mouth 2 (two) times daily. 180 tablet 1  . meloxicam (MOBIC) 15 MG tablet Take 15 mg by mouth daily.  7  . metFORMIN (GLUCOPHAGE) 500 MG tablet Take 500 mg by mouth daily with breakfast.     . methocarbamol (ROBAXIN) 500 MG tablet Take 1 tablet (500 mg total) by mouth every 6 (six) hours as needed for muscle spasms. 60 tablet 3  . Multiple Vitamin (MULTIVITAMIN WITH MINERALS) TABS tablet Take 1 tablet by mouth daily.    . nitroGLYCERIN (NITROSTAT) 0.4 MG SL tablet Place 0.4 mg under the tongue every 5 (five) minutes as needed for chest pain.    . Omega-3 Fatty Acids (FISH OIL) 1000 MG CAPS Take 1,000 mg by mouth daily.     Marland Kitchen oxyCODONE-acetaminophen (PERCOCET/ROXICET) 5-325 MG tablet Take 1-2  tablets by mouth every 4 (four) hours as needed for moderate pain. 60 tablet 0  . hydrALAZINE (APRESOLINE) 25 MG tablet TAKE 1 TABLET BY MOUTH 3 TIMES A DAY 270 tablet 2   No facility-administered medications prior to visit.      Allergies:   Penicillins   Social History   Social History  . Marital status: Married    Spouse name: N/A  . Number of children: N/A  . Years of education: N/A   Social History Main Topics  . Smoking status: Former Research scientist (life sciences)  . Smokeless tobacco: None     Comment: history of smoking-quit a yr ago  . Alcohol use Yes     Comment: socially  . Drug use: No  . Sexual activity: Not Asked   Other Topics Concern  . None   Social History Narrative  . None     Family History:  The patient's family history includes Cancer in his mother; Heart attack in his mother; Heart disease in his mother; Hyperlipidemia in his other; Hypertension in his father, mother, and other.   ROS:   Please see the history of present illness.    Review of Systems  Constitution: Negative.  HENT: Negative.   Eyes: Positive for visual disturbance.  Cardiovascular: Positive for irregular heartbeat.  Respiratory: Positive for snoring.   Endocrine: Negative.   Hematologic/Lymphatic: Negative.   Musculoskeletal: Positive for back pain.  Gastrointestinal: Negative.   Genitourinary: Negative.   Neurological: Positive for loss of balance.   All other systems reviewed and are negative.   PHYSICAL EXAM:   VS:  BP (!) 178/70 (BP Location: Right Arm, Patient Position: Sitting, Cuff Size: Normal)   Pulse (!) 58   Ht 5\' 11"  (1.803 m)   Wt 260 lb 1.9 oz (118 kg)   BMI 36.28 kg/m   Physical Exam  GEN: Well nourished, well developed, in no acute distress  Neck: bilateral carotid bruitsno JVD, or masses Cardiac:RRR; positive S4,no murmurs, rubs  Respiratory:  Decreased breath sounds with scattered wheezes GI: soft, nontender, nondistended, + BS Ext: without cyanosis, clubbing, or  edema, Good distal pulses bilaterally MS: no deformity or atrophy  Skin: warm and dry, no rash Neuro:  Alert and Oriented x 3, Strength and sensation are intact Psych: euthymic mood, full affect  Wt Readings from Last 3 Encounters:  02/09/16 260 lb 1.9 oz (118 kg)  01/05/16 266 lb 15.6 oz (121.1 kg)  12/30/15 267 lb 9.6 oz (121.4 kg)      Studies/Labs Reviewed:   EKG:  EKG is ordered today.  The ekg ordered today demonstrates sinus bradycardia  at 60 bpm with old septal infarct, no acute change  Recent Labs: 05/08/2015: TSH 2.066 05/09/2015: ALT 25; B Natriuretic Peptide 24.9 01/11/2016: BUN 17; Creatinine, Ser 0.98; Hemoglobin 12.5; Platelets 231; Potassium 3.8; Sodium 134   Lipid Panel    Component Value Date/Time   CHOL  03/29/2007 0015    194        ATP III CLASSIFICATION:  <200     mg/dL   Desirable  200-239  mg/dL   Borderline High  >=240    mg/dL   High   TRIG 140 03/29/2007 0015   HDL 39 (L) 03/29/2007 0015   CHOLHDL 5.0 03/29/2007 0015   VLDL 28 03/29/2007 0015   LDLCALC (H) 03/29/2007 0015    127        Total Cholesterol/HDL:CHD Risk Coronary Heart Disease Risk Table                     Men   Women  1/2 Average Risk   3.4   3.3    Additional studies/ records that were reviewed today include:  2Decho 04/2015: Study Conclusions   - Left ventricle: The cavity size was normal. Systolic function was   normal. The estimated ejection fraction was in the range of 55%   to 60%. Wall motion was normal; there were no regional wall   motion abnormalities. Features are consistent with a pseudonormal   left ventricular filling pattern, with concomitant abnormal   relaxation and increased filling pressure (grade 2 diastolic   dysfunction). Doppler parameters are consistent with high   ventricular filling pressure. - Aortic valve: Moderate focal calcification involving the   noncoronary cusp. - Mitral valve: There was trivial regurgitation. - Atrial septum: There was  increased thickness of the septum,   consistent with lipomatous hypertrophy. Echo contrast study   showed no right-to-left atrial level shunt, at baseline or with   provocation.     ASSESSMENT:    1. Essential hypertension   2. Coronary artery disease involving native coronary artery of native heart without angina pectoris   3. Hyperlipidemia with target LDL less than 70   4. Type 2 diabetes mellitus not at goal [facility])   5. Bilateral carotid bruits      PLAN:  In order of problems listed above: Essential hypertension blood pressure has been elevated recently I suspect secondary to back pain from recent surgery and excessive sodium intake. Recommend 2 g sodium diet will increase hydralazine to 50 mg 3 times a day. He will check his blood pressures at home and call us next week with readings. Follow-up me if needed for ongoing hypertension. Follow-up with Dr. Tamala Julian in 4 months.  CAD status post BMS to the RCA in 2008 stable without further chest pain  Hyperlipidemia on Crestor and fish oil  Type 2 diabetes mellitus hemoglobin A1c recently 6.2 last month  Bilateral carotid bruits check carotid Dopplers     Medication Adjustments/Labs and Tests Ordered: Current medicines are reviewed at length with the patient today.  Concerns regarding medicines are outlined above.  Medication changes, Labs and Tests ordered today are listed in the Patient Instructions below. Patient Instructions  Medication Instructions:  Increase hydralazine to 50mg  three times a day. You can take 2 of your 25mg  tablets three times a day and use your current supply.  Labwork: BMET today  Testing/Procedures: Your physician has requested that you have a carotid duplex. This test is an ultrasound of the carotid arteries in  your neck. It looks at blood flow through these arteries that supply the brain with blood. Allow one hour for this exam. There are no restrictions or special  instructions.    Follow-Up: Your physician recommends that you schedule a follow-up appointment in: 4 months with Dr Tamala Julian.   Any Other Special Instructions Will Be Listed Below (If Applicable).   Your physician has requested that you regularly monitor and record your blood pressure readings at home. Please use the same machine at the same time of day to check your readings and record them. Call the office next week with your blood pressure readings. [phone]      Low-Sodium Eating Plan Sodium raises blood pressure and causes water to be held in the body. Getting less sodium from food will help lower your blood pressure, reduce any swelling, and protect your heart, liver, and kidneys. We get sodium by adding salt (sodium chloride) to food. Most of our sodium comes from canned, boxed, and frozen foods. Restaurant foods, fast foods, and pizza are also very high in sodium. Even if you take medicine to lower your blood pressure or to reduce fluid in your body, getting less sodium from your food is important. WHAT IS MY PLAN? Most people should limit their sodium intake to 2,300 mg a day. Your health care provider recommends that you limit your sodium intake to __________ a day.  WHAT DO I NEED TO KNOW ABOUT THIS EATING PLAN? For the low-sodium eating plan, you will follow these general guidelines:  Choose foods with a % Daily Value for sodium of less than 5% (as listed on the food label).   Use salt-free seasonings or herbs instead of table salt or sea salt.   Check with your health care provider or pharmacist before using salt substitutes.   Eat fresh foods.  Eat more vegetables and fruits.  Limit canned vegetables. If you do use them, rinse them well to decrease the sodium.   Limit cheese to 1 oz (28 g) per day.   Eat lower-sodium products, often labeled as "lower sodium" or "no salt added."  Avoid foods that contain monosodium glutamate (MSG). MSG is sometimes added  to [place] food and some canned foods.  Check food labels (Nutrition Facts labels) on foods to learn how much sodium is in one serving.  Eat more home-cooked food and less restaurant, buffet, and fast food.  When eating at a restaurant, ask that your food be prepared with less salt, or no salt if possible.  HOW DO I READ FOOD LABELS FOR SODIUM INFORMATION? The Nutrition Facts label lists the amount of sodium in one serving of the food. If you eat more than one serving, you must multiply the listed amount of sodium by the number of servings. Food labels may also identify foods as:  Sodium free--Less than 5 mg in a serving.  Very low sodium--35 mg or less in a serving.  Low sodium--140 mg or less in a serving.  Light in sodium--50% less sodium in a serving. For example, if a food that usually has 300 mg of sodium is changed to become light in sodium, it will have 150 mg of sodium.  Reduced sodium--25% less sodium in a serving. For example, if a food that usually has 400 mg of sodium is changed to reduced sodium, it will have 300 mg of sodium. WHAT FOODS CAN I EAT? Grains Low-sodium cereals, including oats, puffed wheat and rice, and shredded wheat cereals. Low-sodium crackers. Unsalted rice  and pasta. Lower-sodium bread.  Vegetables Frozen or fresh vegetables. Low-sodium or reduced-sodium canned vegetables. Low-sodium or reduced-sodium tomato sauce and paste. Low-sodium or reduced-sodium tomato and vegetable juices.  Fruits Fresh, frozen, and canned fruit. Fruit juice.  Meat and Other Protein Products Low-sodium canned tuna and salmon. Fresh or frozen meat, poultry, seafood, and fish. Lamb. Unsalted nuts. Dried beans, peas, and lentils without added salt. Unsalted canned beans. Homemade soups without salt. Eggs.  Dairy Milk. Soy milk. Ricotta cheese. Low-sodium or reduced-sodium cheeses. Yogurt.  Condiments Fresh and dried herbs and spices. Salt-free seasonings. Onion and  garlic powders. Low-sodium varieties of mustard and ketchup. Fresh or refrigerated horseradish. Lemon juice.  Fats and Oils Reduced-sodium salad dressings. Unsalted butter.  Other Unsalted popcorn and pretzels.  The items listed above may not be a complete list of recommended foods or beverages. Contact your dietitian for more options. WHAT FOODS ARE NOT RECOMMENDED? Grains Instant hot cereals. Bread stuffing, pancake, and biscuit mixes. Croutons. Seasoned rice or pasta mixes. Noodle soup cups. Boxed or frozen macaroni and cheese. Self-rising flour. Regular salted crackers. Vegetables Regular canned vegetables. Regular canned tomato sauce and paste. Regular tomato and vegetable juices. Frozen vegetables in sauces. Salted [place] fries. Olives. Angie Fava. Relishes. Sauerkraut. Salsa. Meat and Other Protein Products Salted, canned, smoked, spiced, or pickled meats, seafood, or fish. Bacon, ham, sausage, hot dogs, corned beef, chipped beef, and packaged luncheon meats. Salt pork. Jerky. Pickled herring. Anchovies, regular canned tuna, and sardines. Salted nuts. Dairy Processed cheese and cheese spreads. Cheese curds. Blue cheese and cottage cheese. Buttermilk.  Condiments Onion and garlic salt, seasoned salt, table salt, and sea salt. Canned and packaged gravies. Worcestershire sauce. Tartar sauce. Barbecue sauce. Teriyaki sauce. Soy sauce, including reduced sodium. Steak sauce. Fish sauce. Oyster sauce. Cocktail sauce. Horseradish that you find on the shelf. Regular ketchup and mustard. Meat flavorings and tenderizers. Bouillon cubes. Hot sauce. Tabasco sauce. Marinades. Taco seasonings. Relishes. Fats and Oils Regular salad dressings. Salted butter. Margarine. Ghee. Bacon fat.  Other Potato and tortilla chips. Corn chips and puffs. Salted popcorn and pretzels. Canned or dried soups. Pizza. Frozen entrees and pot pies.  The items listed above may not be a complete list of foods and  beverages to avoid. Contact your dietitian for more information.   This information is not intended to replace advice given to you by your health care provider. Make sure you discuss any questions you have with your health care provider.   Document Released: 12/17/2001 Document Revised: 07/18/2014 Document Reviewed: 05/01/2013 Elsevier Interactive Patient Education [employer].     If you need a refill on your cardiac medications before your next appointment, please call your pharmacy.      Sumner Boast, PA-C  02/09/2016 10:44 AM    [facility] [address], [place], [place]  [address] Phone: [phone]; Fax: [phone]

## 2016-02-09 NOTE — Addendum Note (Signed)
Addended by: Eulis Foster on: 02/09/2016 11:01 AM   Modules accepted: Orders

## 2016-02-11 ENCOUNTER — Ambulatory Visit (HOSPITAL_COMMUNITY)
Admission: RE | Admit: 2016-02-11 | Discharge: 2016-02-11 | Disposition: A | Discharge status: Home / Self Care | Payer: Medicare Other | Source: Ambulatory Visit | Attending: Physician Assistant | Admitting: Physician Assistant

## 2016-02-11 DIAGNOSIS — I2510 Atherosclerotic heart disease of native coronary artery without angina pectoris: Secondary | ICD-10-CM | POA: Diagnosis not present

## 2016-02-11 DIAGNOSIS — I10 Essential (primary) hypertension: Secondary | ICD-10-CM | POA: Insufficient documentation

## 2016-02-11 DIAGNOSIS — E1142 Type 2 diabetes mellitus with diabetic polyneuropathy: Secondary | ICD-10-CM | POA: Insufficient documentation

## 2016-02-11 DIAGNOSIS — E785 Hyperlipidemia, unspecified: Secondary | ICD-10-CM | POA: Diagnosis not present

## 2016-02-11 DIAGNOSIS — G4730 Sleep apnea, unspecified: Secondary | ICD-10-CM | POA: Insufficient documentation

## 2016-02-11 DIAGNOSIS — R0989 Other specified symptoms and signs involving the circulatory and respiratory systems: Secondary | ICD-10-CM | POA: Insufficient documentation

## 2016-02-11 DIAGNOSIS — I6523 Occlusion and stenosis of bilateral carotid arteries: Secondary | ICD-10-CM | POA: Insufficient documentation

## 2016-02-11 DIAGNOSIS — E119 Type 2 diabetes mellitus without complications: Secondary | ICD-10-CM

## 2016-02-12 ENCOUNTER — Telehealth: Payer: Self-pay

## 2016-02-12 DIAGNOSIS — I10 Essential (primary) hypertension: Secondary | ICD-10-CM

## 2016-02-12 NOTE — Telephone Encounter (Signed)
-----   Message from Belva Crome, MD sent at 02/11/2016  2:02 PM EDT ----- Regarding: Laboratory work The patient know that his electrolytes and kidney function were normal.

## 2016-02-12 NOTE — Telephone Encounter (Signed)
Called to give pt lab result message.[email or web address] The patient know that his electrolytes and kidney function were normal.

## 2016-02-15 NOTE — Telephone Encounter (Signed)
Follow Up: ° ° ° ° ° °Returning your call from Friday. °

## 2016-02-15 NOTE — Telephone Encounter (Signed)
Pt aware of lab results with verbal understanding.  Pt sts that per Estella Husk, PA-C he was to call back to report his BP readings 1 week after his recent medication adjustments. Pt sts that he has this constant thumping/pounding in his left ear that should like his heartbeat, he mentioned this to [place], she wanted him to let us know if it improved with the med change. Pt sts that it hasn't    8/2    176/68, 164/63 8/3     181/67, 162/66 8/4  147/63 8/5 153/72 8/6 189/85, 163/73 8/7  175/76, 151/67  Pt sts that his heartrate has been running  between 54-58 bpm  Adv pt that I will fwd his readings to  Dr.Smith and call back with his recommendations. Pt agreeable with plan and verbalized understanding.

## 2016-02-17 NOTE — Telephone Encounter (Signed)
Start Aldactone/spironolactone 25 mg daily in addition to his current medical regimen. Hopefully after some time, we will be able to discontinue hydralazine. Continue to measure blood pressure. Basic metabolic panel in 7 -10 days.

## 2016-02-19 MED ORDER — SPIRONOLACTONE 25 MG PO TABS: 25.0000 mg | ORAL_TABLET | Freq: Every day | ORAL | 5 refills | Status: DC

## 2016-02-19 NOTE — Telephone Encounter (Signed)
Pt aware of Dr.Smit's recommendation. Start Spironolactone 25mg  daily. Rx sent to pt pharmacy. Lab appt schedule for 8/18. Pt will continue to monitor BP and send reading in via my chart. Pt verbalized understanding to instruction given and verbalized understanding.

## 2016-02-24 DIAGNOSIS — I10 Essential (primary) hypertension: Secondary | ICD-10-CM | POA: Diagnosis not present

## 2016-02-24 DIAGNOSIS — Z6836 Body mass index (BMI) 36.0-36.9, adult: Secondary | ICD-10-CM | POA: Diagnosis not present

## 2016-02-24 DIAGNOSIS — M4806 Spinal stenosis, lumbar region: Secondary | ICD-10-CM | POA: Diagnosis not present

## 2016-02-26 ENCOUNTER — Other Ambulatory Visit: Payer: Medicare Other | Admitting: *Deleted

## 2016-02-26 DIAGNOSIS — I10 Essential (primary) hypertension: Secondary | ICD-10-CM | POA: Diagnosis not present

## 2016-02-26 LAB — BASIC METABOLIC PANEL
BUN: 19 mg/dL (ref 7–25)
CO2: 22 mmol/L (ref 20–31)
Calcium: 9.4 mg/dL (ref 8.6–10.3)
Chloride: 102 mmol/L (ref 98–110)
Creat: 0.86 mg/dL (ref 0.70–1.18)
Glucose, Bld: 117 mg/dL — ABNORMAL HIGH (ref 65–99)
Potassium: 4.4 mmol/L (ref 3.5–5.3)
Sodium: 138 mmol/L (ref 135–146)

## 2016-02-29 ENCOUNTER — Encounter: Payer: Self-pay | Admitting: Interventional Cardiology

## 2016-04-14 DIAGNOSIS — Z23 Encounter for immunization: Secondary | ICD-10-CM | POA: Diagnosis not present

## 2016-05-04 DIAGNOSIS — Z1389 Encounter for screening for other disorder: Secondary | ICD-10-CM | POA: Diagnosis not present

## 2016-05-04 DIAGNOSIS — Z0000 Encounter for general adult medical examination without abnormal findings: Secondary | ICD-10-CM | POA: Diagnosis not present

## 2016-05-04 DIAGNOSIS — Z7984 Long term (current) use of oral hypoglycemic drugs: Secondary | ICD-10-CM | POA: Diagnosis not present

## 2016-05-04 DIAGNOSIS — G4730 Sleep apnea, unspecified: Secondary | ICD-10-CM | POA: Diagnosis not present

## 2016-05-04 DIAGNOSIS — I2510 Atherosclerotic heart disease of native coronary artery without angina pectoris: Secondary | ICD-10-CM | POA: Diagnosis not present

## 2016-05-04 DIAGNOSIS — E7800 Pure hypercholesterolemia, unspecified: Secondary | ICD-10-CM | POA: Diagnosis not present

## 2016-05-04 DIAGNOSIS — I779 Disorder of arteries and arterioles, unspecified: Secondary | ICD-10-CM | POA: Diagnosis not present

## 2016-05-04 DIAGNOSIS — E1165 Type 2 diabetes mellitus with hyperglycemia: Secondary | ICD-10-CM | POA: Diagnosis not present

## 2016-05-04 DIAGNOSIS — Z6838 Body mass index (BMI) 38.0-38.9, adult: Secondary | ICD-10-CM | POA: Diagnosis not present

## 2016-05-04 DIAGNOSIS — I10 Essential (primary) hypertension: Secondary | ICD-10-CM | POA: Diagnosis not present

## 2016-05-04 DIAGNOSIS — Z125 Encounter for screening for malignant neoplasm of prostate: Secondary | ICD-10-CM | POA: Diagnosis not present

## 2016-05-04 DIAGNOSIS — E663 Overweight: Secondary | ICD-10-CM | POA: Diagnosis not present

## 2016-05-11 ENCOUNTER — Other Ambulatory Visit: Payer: Self-pay | Admitting: *Deleted

## 2016-05-11 MED ORDER — SPIRONOLACTONE 25 MG PO TABS: 25.0000 mg | ORAL_TABLET | Freq: Every day | ORAL | 0 refills | Status: DC

## 2016-06-07 ENCOUNTER — Encounter: Payer: Self-pay | Admitting: Interventional Cardiology

## 2016-06-14 ENCOUNTER — Encounter: Payer: Self-pay | Admitting: Interventional Cardiology

## 2016-06-14 ENCOUNTER — Ambulatory Visit (INDEPENDENT_AMBULATORY_CARE_PROVIDER_SITE_OTHER): Payer: Medicare Other | Admitting: Interventional Cardiology

## 2016-06-14 VITALS — BP 132/60 | HR 59 | Ht 71.0 in | Wt 268.2 lb

## 2016-06-14 DIAGNOSIS — R0989 Other specified symptoms and signs involving the circulatory and respiratory systems: Secondary | ICD-10-CM

## 2016-06-14 DIAGNOSIS — I10 Essential (primary) hypertension: Secondary | ICD-10-CM | POA: Diagnosis not present

## 2016-06-14 DIAGNOSIS — I2510 Atherosclerotic heart disease of native coronary artery without angina pectoris: Secondary | ICD-10-CM

## 2016-06-14 DIAGNOSIS — G4733 Obstructive sleep apnea (adult) (pediatric): Secondary | ICD-10-CM

## 2016-06-14 DIAGNOSIS — Z9989 Dependence on other enabling machines and devices: Secondary | ICD-10-CM

## 2016-06-14 DIAGNOSIS — I5033 Acute on chronic diastolic (congestive) heart failure: Secondary | ICD-10-CM

## 2016-06-14 DIAGNOSIS — E785 Hyperlipidemia, unspecified: Secondary | ICD-10-CM

## 2016-06-14 DIAGNOSIS — E119 Type 2 diabetes mellitus without complications: Secondary | ICD-10-CM

## 2016-06-14 NOTE — Patient Instructions (Signed)
Medication Instructions: Your physician recommends that you continue on your current medications as directed. Please refer to the Current Medication list given to you today.   Labwork: None Ordered  Procedures/Testing: None Ordered  Follow-Up: Your physician wants you to follow-up in: 9-12 MONTHS with Dr. Tamala Julian. You will receive a reminder letter in the mail two months in advance. If you don't receive a letter, please call our office to schedule the follow-up appointment.   Any Additional Special Instructions Will Be Listed Below (If Applicable).     If you need a refill on your cardiac medications before your next appointment, please call your pharmacy.

## 2016-06-14 NOTE — Progress Notes (Signed)
Cardiology Office Note    Date:  06/14/2016   ID:  Kenneth Lee, DOB 1946/02/17, MRN [id number]  PCP:  Kenneth Hams, MD  Cardiologist: Kenneth Grooms, MD   Chief Complaint  Patient presents with  . Coronary Artery Disease    History of Present Illness:  Kenneth Lee is a 70 y.o. male who presents for CAD with her mental stent RCA 2008, bilateral carotid disease, sinus node dysfunction, hypertension, hyperlipidemia, and obstructive sleep apnea.    Kenneth Lee is having significant difficulty with his lumbar spine. He has had 2 operations over the past 12 months. With rehabilitation and also the stress of each operation he has had no cardiac difficulty.  When he was seen in August by Erik Obey., PA-C, his blood pressure was elevated. Adjustments were made. He has tolerated them well. This is follow-up for that problem and his general vascular health.   Past Medical History:  Diagnosis Date  . Arthritis   . Carotid disease, bilateral ([place])   . Chronic back pain    stenosis  . Coronary artery disease    with bare mental stent 2008 and RCA is Card   Kenneth Lee  . Diabetes mellitus without complication ([place])    takes Metformin daily  . Headache    occasionally  . History of colon polyps    benign  . Hyperlipidemia    takes Crestor daily  . Hypertension    takes Amlodipine,Bystolic,Apresoline,and Lisinopril daily.   . Joint pain   . Other specified cardiac dysrhythmias(427.89)   . Peripheral edema    takes Lasix daily  . Peripheral neuropathy ([place])   . Sinoatrial node dysfunction (H[facility])   . Sleep apnea   . Weakness    numbness and tingling,left leg    Past Surgical History:  Procedure Laterality Date  . aortobifemoral bypass  1996  . BACK SURGERY  2016   fusion  . CARDIAC CATHETERIZATION  2008  . CHOLECYSTECTOMY  2001  . COLONOSCOPY    . CORONARY ANGIOPLASTY     1 stent  . discectomy  20 yrs ago  . ESOPHAGOGASTRODUODENOSCOPY      Current  Medications: Outpatient Medications Prior to Visit  Medication Sig Dispense Refill  . amLODipine (NORVASC) 5 MG tablet TAKE 1 TABLET (5 MG TOTAL) BY MOUTH DAILY. 90 tablet 3  . aspirin EC 81 MG tablet Take 1 tablet (81 mg total) by mouth daily.    Kenneth Lee BYSTOLIC 5 MG tablet TAKE 1 TABLET EVERY DAY 90 tablet 2  . CRESTOR 10 MG tablet TAKE 1 TABLET BY MOUTH EVERY DAY 30 tablet 8  . fluticasone (FLONASE) 50 MCG/ACT nasal spray Place 2 sprays into both nostrils daily.  5  . furosemide (LASIX) 40 MG tablet Take 1 tablet (40 mg total) by mouth daily. 90 tablet 3  . meloxicam (MOBIC) 15 MG tablet Take 15 mg by mouth daily.  7  . metFORMIN (GLUCOPHAGE) 500 MG tablet Take 500 mg by mouth daily with breakfast.     . methocarbamol (ROBAXIN) 500 MG tablet Take 1 tablet (500 mg total) by mouth every 6 (six) hours as needed for muscle spasms. 60 tablet 3  . Multiple Vitamin (MULTIVITAMIN WITH MINERALS) TABS tablet Take 1 tablet by mouth daily.    . nitroGLYCERIN (NITROSTAT) 0.4 MG SL tablet Place 0.4 mg under the tongue every 5 (five) minutes as needed for chest pain.    . Omega-3 Fatty Acids (FISH OIL) 1000 MG CAPS Take  1,000 mg by mouth daily.     Kenneth Lee spironolactone (ALDACTONE) 25 MG tablet Take 1 tablet (25 mg total) by mouth daily. 90 tablet 0  . hydrALAZINE (APRESOLINE) 50 MG tablet Take 1 tablet (50 mg total) by mouth 3 (three) times daily. 270 tablet 1  . lisinopril (PRINIVIL,ZESTRIL) 20 MG tablet Take 1 tablet (20 mg total) by mouth 2 (two) times daily. 180 tablet 1  . azithromycin (ZITHROMAX) 250 MG tablet Take 1 tablet (250 mg total) by mouth daily. (Patient not taking: Reported on 06/14/2016) 4 tablet 0  . HYDROcodone-acetaminophen (NORCO) 10-325 MG tablet Take 1 tablet by mouth every 6 (six) hours as needed. for pain  0  . oxyCODONE-acetaminophen (PERCOCET/ROXICET) 5-325 MG tablet Take 1-2 tablets by mouth every 4 (four) hours as needed for moderate pain. (Patient not taking: Reported on 06/14/2016) 60  tablet 0   No facility-administered medications prior to visit.      Allergies:   Penicillins   Social History   Social History  . Marital status: Married    Spouse name: N/A  . Number of children: N/A  . Years of education: N/A   Social History Main Topics  . Smoking status: Former Research scientist (life sciences)  . Smokeless tobacco: Never Used     Comment: history of smoking-quit a yr ago  . Alcohol use Yes     Comment: socially  . Drug use: No  . Sexual activity: Not Asked   Other Topics Concern  . None   Social History Narrative  . None     Family History:  The patient's family history includes Cancer in his mother; Heart attack in his mother; Heart disease in his mother; Hyperlipidemia in his other; Hypertension in his father, mother, and other.   ROS:   Please see the history of present illness.    Fatigue, back pain, muscle pain, dizziness, difficulty with balance, constipation, snoring, and fatigue.  All other systems reviewed and are negative.   PHYSICAL EXAM:   VS:  BP 132/60   Pulse (!) 59   Ht 5\' 11"  (1.803 m)   Wt 268 lb 3.2 oz (121.7 kg)   SpO2 95%   BMI 37.41 kg/m    GEN: Well nourished, well developed, in no acute distress . Obesity is present. HEENT: normal  Neck: no JVD, carotid bruits, or masses Cardiac: RRR; no murmurs, rubs, or gallops,no edema  Respiratory:  clear to auscultation bilaterally, normal work of breathing GI: soft, nontender, nondistended, + BS MS: no deformity or atrophy  Skin: warm and dry, no rash Neuro:  Alert and Oriented x 3, Strength and sensation are intact Psych: euthymic mood, full affect  Wt Readings from Last 3 Encounters:  06/14/16 268 lb 3.2 oz (121.7 kg)  02/09/16 260 lb 1.9 oz (118 kg)  01/05/16 266 lb 15.6 oz (121.1 kg)      Studies/Labs Reviewed:   EKG:  EKG  The electrocardiogram is not repeated.  Recent Labs: 01/11/2016: Hemoglobin 12.5; Platelets 231 02/26/2016: BUN 19; Creat 0.86; Potassium 4.4; Sodium 138   Lipid  Panel    Component Value Date/Time   CHOL  03/29/2007 0015    194        ATP III CLASSIFICATION:  <200     mg/dL   Desirable  200-239  mg/dL   Borderline High  >=240    mg/dL   High   TRIG 140 03/29/2007 0015   HDL 39 (L) 03/29/2007 0015   CHOLHDL 5.0 03/29/2007 0015  VLDL 28 03/29/2007 0015   LDLCALC (H) 03/29/2007 0015    127        Total Cholesterol/HDL:CHD Risk Coronary Heart Disease Risk Table                     Men   Women  1/2 Average Risk   3.4   3.3    Additional studies/ records that were reviewed today include:  Carotid Doppler, 02/11/2016:  Conclusions:  Technically challenging study. Heterogeneous plaque, bilaterally. Stable 1-39% bilateral ICA stenosis. >50% LECA stenosis. Normal subclavian arteries, bilaterally. Patent vertebral arteries with antegrade flow.    ASSESSMENT:    1. Coronary artery disease involving native coronary artery of native heart without angina pectoris   2. Essential hypertension   3. Acute on chronic diastolic heart failure ([place])   4. OSA on CPAP   5. Type 2 diabetes mellitus not at goal [facility])   6. Hyperlipidemia with target LDL less than 70   7. Bilateral carotid bruits      PLAN:  In order of problems listed above:  1. Stable without angina pectoris. No changes in necessary. 2. Excellent blood pressure control on the current regimen utilizing hydralazine. We will continue this current regimen which also includes Bystolic, furosemide, amlodipine, and spironolactone. 3. No evidence of volume overload. Diastolic heart failure is compensated and chronic. 4. Continue C Pap. 5. Not addressed 6. Continue aggressive lipid lowering with LDL target of 70 or below. 7. We reviewed the recent carotid Doppler study. No significant obstruction was noted.    Medication Adjustments/Labs and Tests Ordered: Current medicines are reviewed at length with the patient today.  Concerns regarding medicines are outlined above.  Medication  changes, Labs and Tests ordered today are listed in the Patient Instructions below. There are no Patient Instructions on file for this visit.   Signed, Kenneth Grooms, MD  06/14/2016 10:27 AM    [address], [place], [place]  [address] Phone: [phone]; Fax: [phone]

## 2016-06-24 DIAGNOSIS — H6503 Acute serous otitis media, bilateral: Secondary | ICD-10-CM | POA: Diagnosis not present

## 2016-06-24 DIAGNOSIS — H6993 Unspecified Eustachian tube disorder, bilateral: Secondary | ICD-10-CM | POA: Diagnosis not present

## 2016-06-29 ENCOUNTER — Other Ambulatory Visit: Payer: Self-pay | Admitting: Interventional Cardiology

## 2016-07-11 HISTORY — PX: EYE SURGERY: SHX253

## 2016-07-14 DIAGNOSIS — M48061 Spinal stenosis, lumbar region without neurogenic claudication: Secondary | ICD-10-CM | POA: Diagnosis not present

## 2016-07-14 DIAGNOSIS — M48062 Spinal stenosis, lumbar region with neurogenic claudication: Secondary | ICD-10-CM | POA: Diagnosis not present

## 2016-07-14 DIAGNOSIS — M4801 Spinal stenosis, occipito-atlanto-axial region: Secondary | ICD-10-CM | POA: Diagnosis not present

## 2016-08-07 ENCOUNTER — Other Ambulatory Visit: Payer: Self-pay | Admitting: Physician Assistant

## 2016-08-07 DIAGNOSIS — I10 Essential (primary) hypertension: Secondary | ICD-10-CM

## 2016-08-09 DIAGNOSIS — L821 Other seborrheic keratosis: Secondary | ICD-10-CM | POA: Diagnosis not present

## 2016-08-09 DIAGNOSIS — D2261 Melanocytic nevi of right upper limb, including shoulder: Secondary | ICD-10-CM | POA: Diagnosis not present

## 2016-08-09 DIAGNOSIS — L812 Freckles: Secondary | ICD-10-CM | POA: Diagnosis not present

## 2016-08-09 DIAGNOSIS — D225 Melanocytic nevi of trunk: Secondary | ICD-10-CM | POA: Diagnosis not present

## 2016-08-09 DIAGNOSIS — D2262 Melanocytic nevi of left upper limb, including shoulder: Secondary | ICD-10-CM | POA: Diagnosis not present

## 2016-08-19 ENCOUNTER — Other Ambulatory Visit: Payer: Self-pay | Admitting: Interventional Cardiology

## 2016-08-19 MED ORDER — ROSUVASTATIN CALCIUM 10 MG PO TABS: 10.0000 mg | ORAL_TABLET | Freq: Every day | ORAL | 9 refills | Status: DC

## 2016-08-23 ENCOUNTER — Other Ambulatory Visit: Payer: Self-pay | Admitting: *Deleted

## 2016-08-23 MED ORDER — ROSUVASTATIN CALCIUM 10 MG PO TABS: 10.0000 mg | ORAL_TABLET | Freq: Every day | ORAL | 2 refills | Status: DC

## 2016-08-24 ENCOUNTER — Other Ambulatory Visit: Payer: Self-pay | Admitting: Interventional Cardiology

## 2016-09-06 DIAGNOSIS — H6522 Chronic serous otitis media, left ear: Secondary | ICD-10-CM | POA: Diagnosis not present

## 2016-09-11 ENCOUNTER — Other Ambulatory Visit: Payer: Self-pay | Admitting: Interventional Cardiology

## 2016-09-19 ENCOUNTER — Other Ambulatory Visit: Payer: Self-pay | Admitting: Interventional Cardiology

## 2016-09-20 DIAGNOSIS — J310 Chronic rhinitis: Secondary | ICD-10-CM | POA: Diagnosis not present

## 2016-09-20 DIAGNOSIS — H6522 Chronic serous otitis media, left ear: Secondary | ICD-10-CM | POA: Diagnosis not present

## 2016-09-20 DIAGNOSIS — H906 Mixed conductive and sensorineural hearing loss, bilateral: Secondary | ICD-10-CM | POA: Diagnosis not present

## 2016-09-26 DIAGNOSIS — E1165 Type 2 diabetes mellitus with hyperglycemia: Secondary | ICD-10-CM | POA: Diagnosis not present

## 2016-09-26 DIAGNOSIS — G4733 Obstructive sleep apnea (adult) (pediatric): Secondary | ICD-10-CM | POA: Diagnosis not present

## 2016-09-26 DIAGNOSIS — R5383 Other fatigue: Secondary | ICD-10-CM | POA: Diagnosis not present

## 2016-09-26 DIAGNOSIS — M542 Cervicalgia: Secondary | ICD-10-CM | POA: Diagnosis not present

## 2016-09-28 DIAGNOSIS — E1165 Type 2 diabetes mellitus with hyperglycemia: Secondary | ICD-10-CM | POA: Diagnosis not present

## 2016-09-28 DIAGNOSIS — Z7984 Long term (current) use of oral hypoglycemic drugs: Secondary | ICD-10-CM | POA: Diagnosis not present

## 2016-09-29 DIAGNOSIS — H524 Presbyopia: Secondary | ICD-10-CM | POA: Diagnosis not present

## 2016-09-29 DIAGNOSIS — H2513 Age-related nuclear cataract, bilateral: Secondary | ICD-10-CM | POA: Diagnosis not present

## 2016-10-18 DIAGNOSIS — J300 Vasomotor rhinitis: Secondary | ICD-10-CM | POA: Diagnosis not present

## 2016-10-18 DIAGNOSIS — H66002 Acute suppurative otitis media without spontaneous rupture of ear drum, left ear: Secondary | ICD-10-CM | POA: Diagnosis not present

## 2016-11-02 DIAGNOSIS — E663 Overweight: Secondary | ICD-10-CM | POA: Diagnosis not present

## 2016-11-02 DIAGNOSIS — E1165 Type 2 diabetes mellitus with hyperglycemia: Secondary | ICD-10-CM | POA: Diagnosis not present

## 2016-11-02 DIAGNOSIS — E7800 Pure hypercholesterolemia, unspecified: Secondary | ICD-10-CM | POA: Diagnosis not present

## 2016-11-02 DIAGNOSIS — Z7984 Long term (current) use of oral hypoglycemic drugs: Secondary | ICD-10-CM | POA: Diagnosis not present

## 2016-11-02 DIAGNOSIS — Z6838 Body mass index (BMI) 38.0-38.9, adult: Secondary | ICD-10-CM | POA: Diagnosis not present

## 2016-11-02 DIAGNOSIS — G629 Polyneuropathy, unspecified: Secondary | ICD-10-CM | POA: Diagnosis not present

## 2016-11-02 DIAGNOSIS — I10 Essential (primary) hypertension: Secondary | ICD-10-CM | POA: Diagnosis not present

## 2016-11-08 ENCOUNTER — Other Ambulatory Visit: Payer: Self-pay | Admitting: Neurological Surgery

## 2016-11-08 DIAGNOSIS — M48062 Spinal stenosis, lumbar region with neurogenic claudication: Secondary | ICD-10-CM

## 2016-11-18 DIAGNOSIS — D2311 Other benign neoplasm of skin of right eyelid, including canthus: Secondary | ICD-10-CM | POA: Diagnosis not present

## 2016-11-18 DIAGNOSIS — H2513 Age-related nuclear cataract, bilateral: Secondary | ICD-10-CM | POA: Diagnosis not present

## 2016-11-18 DIAGNOSIS — H25011 Cortical age-related cataract, right eye: Secondary | ICD-10-CM | POA: Diagnosis not present

## 2016-11-28 ENCOUNTER — Ambulatory Visit
Admission: RE | Admit: 2016-11-28 | Discharge: 2016-11-28 | Disposition: A | Discharge status: Home / Self Care | Payer: Medicare Other | Source: Ambulatory Visit | Attending: Neurological Surgery | Admitting: Neurological Surgery

## 2016-11-28 DIAGNOSIS — M48061 Spinal stenosis, lumbar region without neurogenic claudication: Secondary | ICD-10-CM | POA: Diagnosis not present

## 2016-11-28 DIAGNOSIS — M48062 Spinal stenosis, lumbar region with neurogenic claudication: Secondary | ICD-10-CM

## 2016-11-28 MED ORDER — GADOBENATE DIMEGLUMINE 529 MG/ML IV SOLN
20.0000 mL | Freq: Once | INTRAVENOUS | Status: AC | PRN
  Administered 2016-11-28: 20 mL via INTRAVENOUS

## 2016-12-14 DIAGNOSIS — M48061 Spinal stenosis, lumbar region without neurogenic claudication: Secondary | ICD-10-CM | POA: Diagnosis not present

## 2016-12-28 DIAGNOSIS — M545 Low back pain: Secondary | ICD-10-CM | POA: Diagnosis not present

## 2016-12-29 DIAGNOSIS — H52221 Regular astigmatism, right eye: Secondary | ICD-10-CM | POA: Diagnosis not present

## 2016-12-29 DIAGNOSIS — H25011 Cortical age-related cataract, right eye: Secondary | ICD-10-CM | POA: Diagnosis not present

## 2016-12-29 DIAGNOSIS — H25811 Combined forms of age-related cataract, right eye: Secondary | ICD-10-CM | POA: Diagnosis not present

## 2016-12-29 DIAGNOSIS — H2511 Age-related nuclear cataract, right eye: Secondary | ICD-10-CM | POA: Diagnosis not present

## 2017-01-01 IMAGING — CR DG LUMBAR SPINE 1V
1 series · 1 of 1 positions shown · non-contrast
Comparison: Previous examinations, including the lumbar spine MR
dated 05/07/2015 and abdomen and pelvis CT dated 02/24/2010.

CLINICAL DATA: L3-4 no L4-5 posterior laminectomy and fusion.

EXAM:
LUMBAR SPINE - 1 VIEW

[lat]
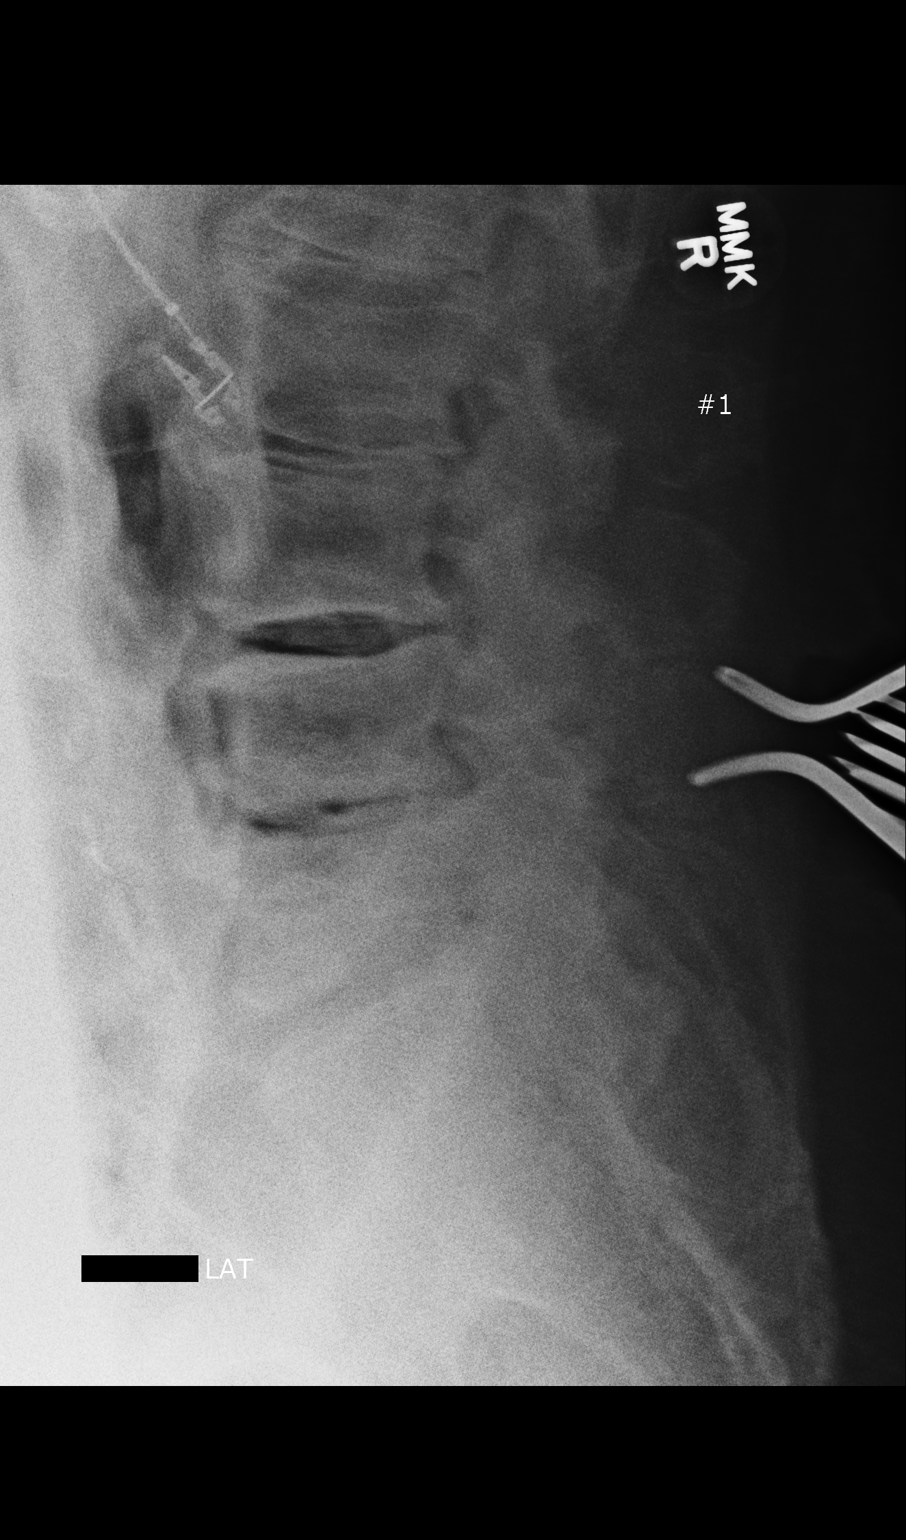

[1 of 1 positions shown; findings below may reference images not displayed]

FINDINGS: A portable cross-table lateral view of the lumbar spine is limited
by motion blurring. Based on the labeling of the previous MR and in
correlation with the previous CT, the last visible open disc space
is labeled the L5-S1 level. There is stable mild retrolisthesis at
the L4-5 level. A metallic localizer is demonstrated its tip
overlying the L4 spinous process and a second metallic localizer is
demonstrated with its tip overlying the space between the L3 and L4
spinous processes.
IMPRESSION: Localizers, as described above.

## 2017-01-01 IMAGING — RF DG C-ARM 61-120 MIN
1 series · 2 of 2 positions shown · non-contrast
Comparison: None.

CLINICAL DATA: Lumbar decompression with posterior fusion

EXAM:
DG C-ARM 61-120 MIN; LUMBAR SPINE - 2-3 VIEW

[Series 1: run · 2 of 2 slices shown]
[im 1/2]
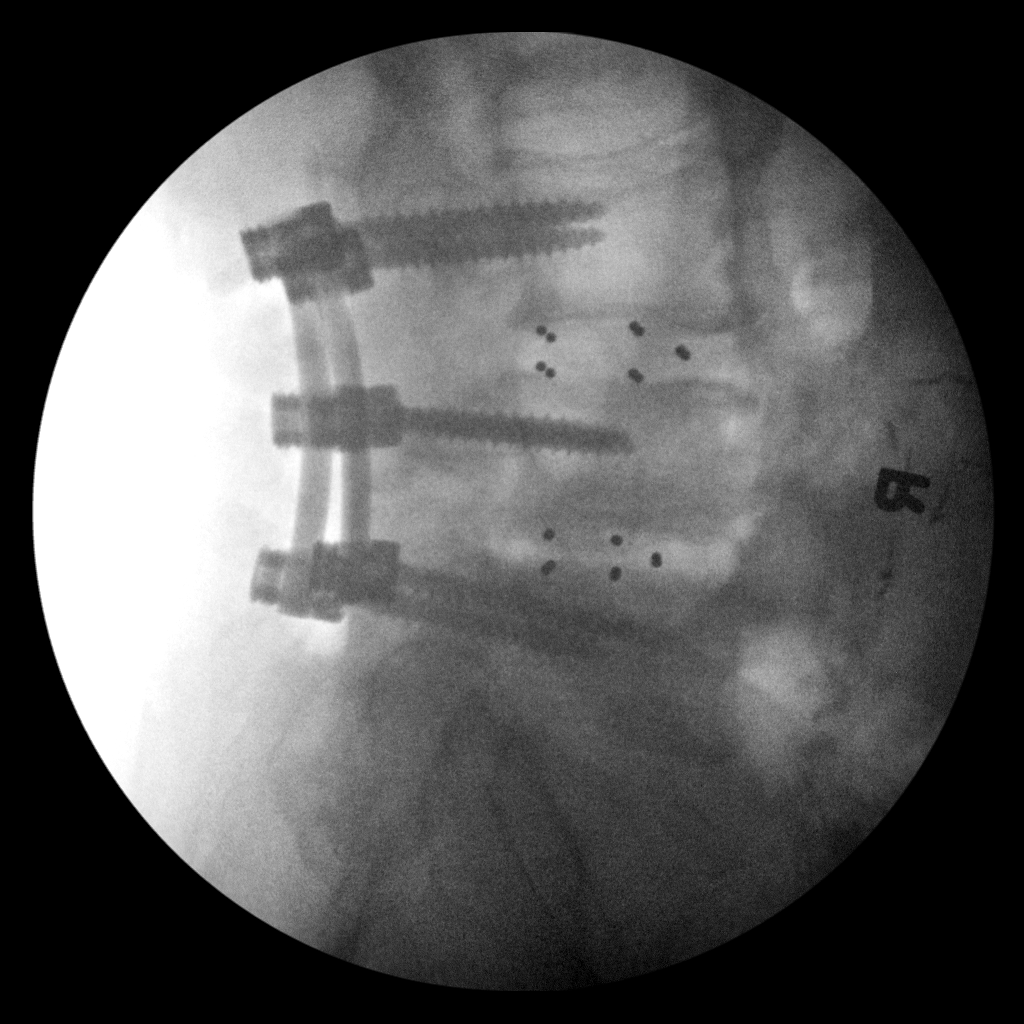
[im 2/2]
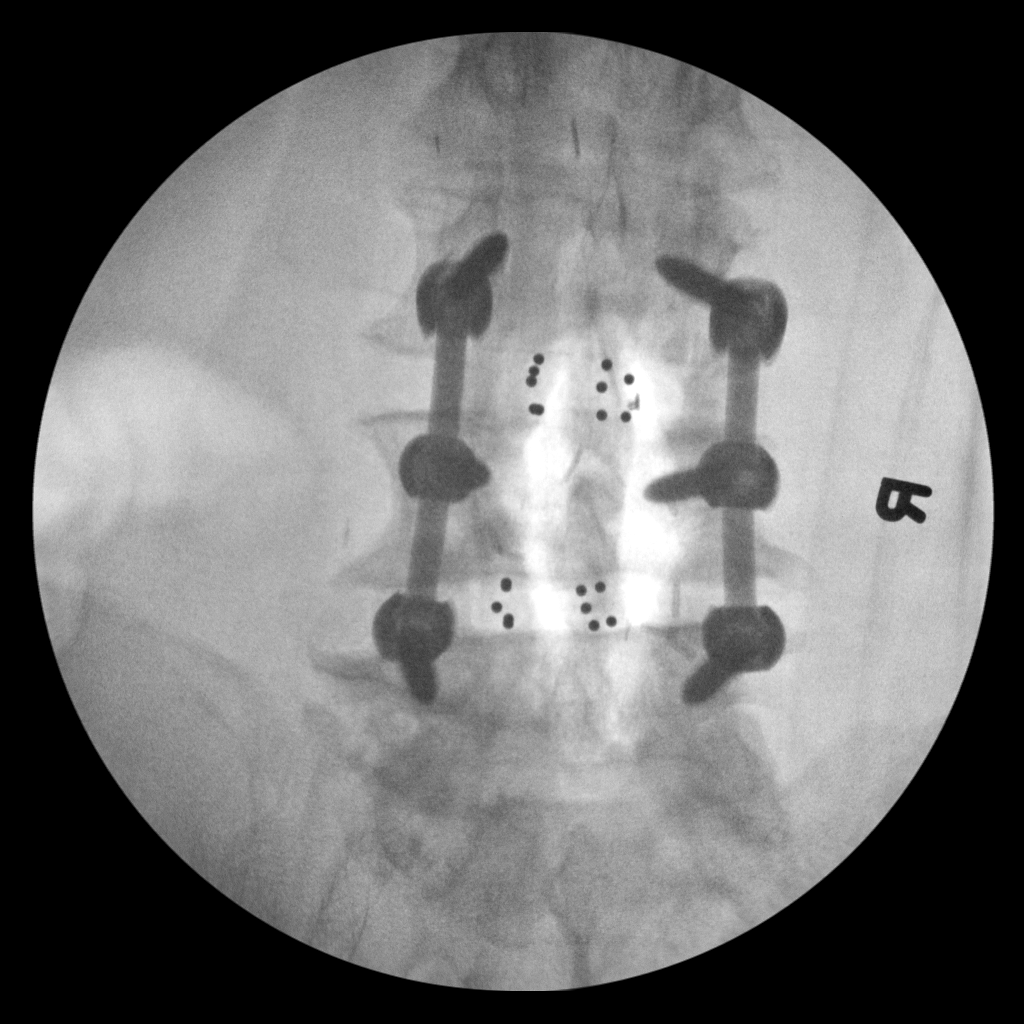

[2 of 2 positions shown; findings below may reference images not displayed]

FLUOROSCOPY TIME:  Radiation Exposure Index (as provided by the
fluoroscopic device): Not available

If the device does not provide the exposure index:

Fluoroscopy Time:  15 seconds

Number of Acquired Images:  2
FINDINGS: Pedicle screws are noted at L3, L4 and L5 with posterior fixation.
Interbody fusion is noted at L3-4 and L4-5.
IMPRESSION: Lumbar decompression with posterior fusion

## 2017-01-02 DIAGNOSIS — M545 Low back pain: Secondary | ICD-10-CM | POA: Diagnosis not present

## 2017-01-05 DIAGNOSIS — M545 Low back pain: Secondary | ICD-10-CM | POA: Diagnosis not present

## 2017-01-18 DIAGNOSIS — M545 Low back pain: Secondary | ICD-10-CM | POA: Diagnosis not present

## 2017-01-19 DIAGNOSIS — M48062 Spinal stenosis, lumbar region with neurogenic claudication: Secondary | ICD-10-CM | POA: Diagnosis not present

## 2017-01-20 DIAGNOSIS — M545 Low back pain: Secondary | ICD-10-CM | POA: Diagnosis not present

## 2017-04-06 ENCOUNTER — Other Ambulatory Visit: Payer: Self-pay | Admitting: Interventional Cardiology

## 2017-04-27 DIAGNOSIS — Z23 Encounter for immunization: Secondary | ICD-10-CM | POA: Diagnosis not present

## 2017-05-13 ENCOUNTER — Other Ambulatory Visit: Payer: Self-pay | Admitting: Interventional Cardiology

## 2017-05-17 DIAGNOSIS — G4733 Obstructive sleep apnea (adult) (pediatric): Secondary | ICD-10-CM | POA: Diagnosis not present

## 2017-05-17 DIAGNOSIS — E663 Overweight: Secondary | ICD-10-CM | POA: Diagnosis not present

## 2017-05-17 DIAGNOSIS — Z125 Encounter for screening for malignant neoplasm of prostate: Secondary | ICD-10-CM | POA: Diagnosis not present

## 2017-05-17 DIAGNOSIS — Z1389 Encounter for screening for other disorder: Secondary | ICD-10-CM | POA: Diagnosis not present

## 2017-05-17 DIAGNOSIS — Z0000 Encounter for general adult medical examination without abnormal findings: Secondary | ICD-10-CM | POA: Diagnosis not present

## 2017-05-17 DIAGNOSIS — E1165 Type 2 diabetes mellitus with hyperglycemia: Secondary | ICD-10-CM | POA: Diagnosis not present

## 2017-05-17 DIAGNOSIS — I10 Essential (primary) hypertension: Secondary | ICD-10-CM | POA: Diagnosis not present

## 2017-05-17 DIAGNOSIS — E7800 Pure hypercholesterolemia, unspecified: Secondary | ICD-10-CM | POA: Diagnosis not present

## 2017-07-01 ENCOUNTER — Other Ambulatory Visit: Payer: Self-pay | Admitting: Interventional Cardiology

## 2017-07-21 DIAGNOSIS — H66002 Acute suppurative otitis media without spontaneous rupture of ear drum, left ear: Secondary | ICD-10-CM | POA: Diagnosis not present

## 2017-07-30 ENCOUNTER — Other Ambulatory Visit: Payer: Self-pay | Admitting: Interventional Cardiology

## 2017-07-31 ENCOUNTER — Telehealth: Payer: Self-pay | Admitting: Interventional Cardiology

## 2017-07-31 MED ORDER — NEBIVOLOL HCL 5 MG PO TABS: 5.0000 mg | ORAL_TABLET | Freq: Every day | ORAL | 1 refills | Status: DC

## 2017-07-31 NOTE — Telephone Encounter (Signed)
New message     *STAT* If patient is at the pharmacy, call can be transferred to refill team.   1. Which medications need to be refilled? (please list name of each medication and dose if known) nebivolol (BYSTOLIC) 5 MG tablet  2. Which pharmacy/location (including street and city if local pharmacy) is medication to be sent to? [facility]  3. Do they need a 30 day or 90 day supply? [place]

## 2017-07-31 NOTE — Telephone Encounter (Signed)
Pt's medication was sent to pt's pharmacy as requested. Confirmation received.  °

## 2017-08-06 ENCOUNTER — Other Ambulatory Visit: Payer: Self-pay | Admitting: Interventional Cardiology

## 2017-08-06 DIAGNOSIS — I10 Essential (primary) hypertension: Secondary | ICD-10-CM

## 2017-08-10 ENCOUNTER — Other Ambulatory Visit: Payer: Self-pay | Admitting: Interventional Cardiology

## 2017-08-11 ENCOUNTER — Other Ambulatory Visit: Payer: Self-pay | Admitting: Interventional Cardiology

## 2017-08-11 DIAGNOSIS — L821 Other seborrheic keratosis: Secondary | ICD-10-CM | POA: Diagnosis not present

## 2017-08-11 DIAGNOSIS — D225 Melanocytic nevi of trunk: Secondary | ICD-10-CM | POA: Diagnosis not present

## 2017-08-11 DIAGNOSIS — D2261 Melanocytic nevi of right upper limb, including shoulder: Secondary | ICD-10-CM | POA: Diagnosis not present

## 2017-08-11 DIAGNOSIS — L812 Freckles: Secondary | ICD-10-CM | POA: Diagnosis not present

## 2017-08-11 DIAGNOSIS — L570 Actinic keratosis: Secondary | ICD-10-CM | POA: Diagnosis not present

## 2017-08-11 DIAGNOSIS — D2262 Melanocytic nevi of left upper limb, including shoulder: Secondary | ICD-10-CM | POA: Diagnosis not present

## 2017-08-28 IMAGING — RF DG LUMBAR SPINE 2-3V
1 series · 2 of 2 positions shown · non-contrast
Comparison: MRI lumbar spine 12/10/2015, radiographs 10/22/2015

CLINICAL DATA: Lumbar fusion

EXAM:
DG C-ARM 61-120 MIN; LUMBAR SPINE - 2-3 VIEW

[Series 1: run · 2 of 2 slices shown]
[im 1/2]
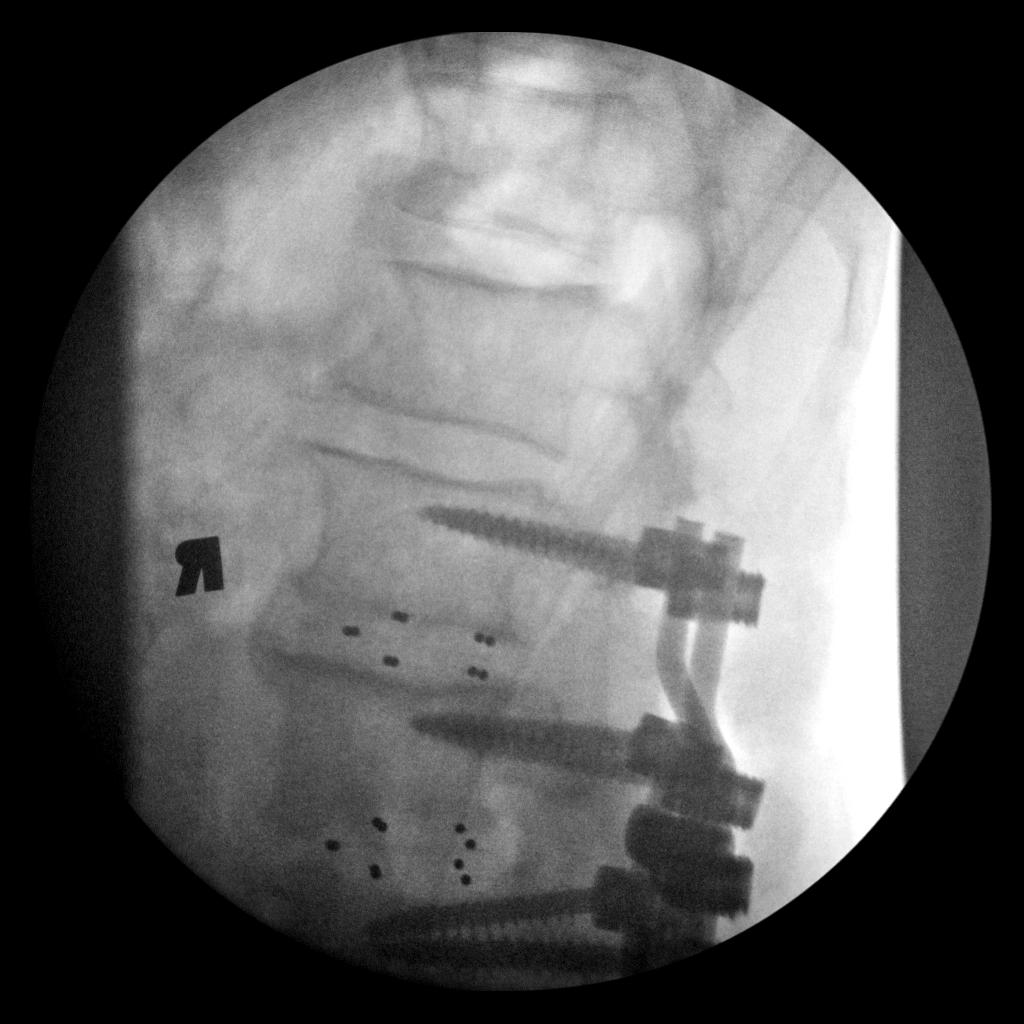
[im 2/2]
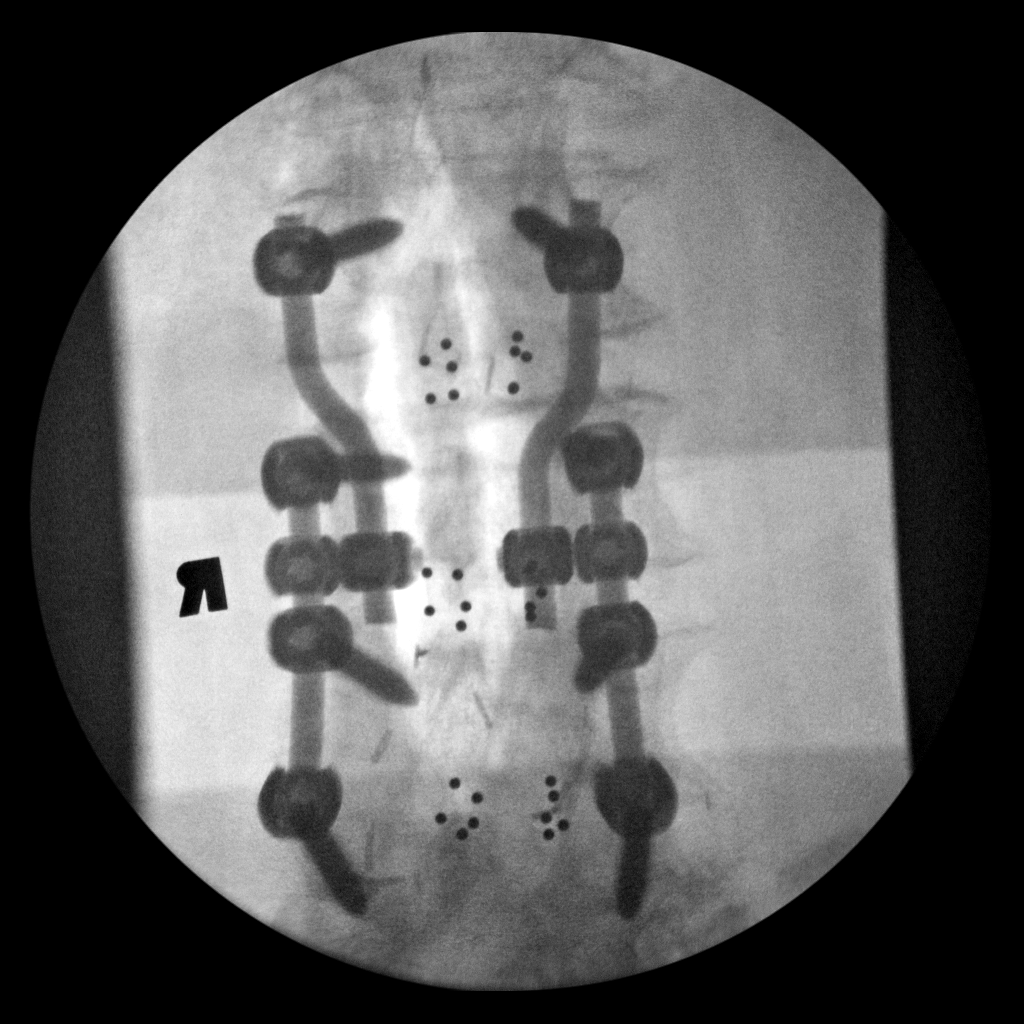

[2 of 2 positions shown; findings below may reference images not displayed]

FINDINGS: Two fluoroscopic intraoperative views. Addition of posterior lumbar
fusion from L2-L3. Interbody spacers at L2-L3. Fusion rods from
L3-L5 unchanged.
IMPRESSION: Posterior lumbar fusion as above.

## 2017-08-30 ENCOUNTER — Other Ambulatory Visit: Payer: Self-pay | Admitting: Interventional Cardiology

## 2017-09-04 ENCOUNTER — Ambulatory Visit: Payer: Medicare Other | Admitting: Interventional Cardiology

## 2017-09-04 ENCOUNTER — Other Ambulatory Visit: Payer: Self-pay | Admitting: Interventional Cardiology

## 2017-09-04 DIAGNOSIS — I10 Essential (primary) hypertension: Secondary | ICD-10-CM

## 2017-09-07 ENCOUNTER — Other Ambulatory Visit: Payer: Self-pay | Admitting: Interventional Cardiology

## 2017-09-23 ENCOUNTER — Other Ambulatory Visit: Payer: Self-pay | Admitting: Interventional Cardiology

## 2017-09-29 DIAGNOSIS — H6522 Chronic serous otitis media, left ear: Secondary | ICD-10-CM | POA: Diagnosis not present

## 2017-09-30 ENCOUNTER — Other Ambulatory Visit: Payer: Self-pay | Admitting: Interventional Cardiology

## 2017-10-05 ENCOUNTER — Other Ambulatory Visit: Payer: Self-pay | Admitting: Interventional Cardiology

## 2017-10-14 ENCOUNTER — Other Ambulatory Visit: Payer: Self-pay | Admitting: Interventional Cardiology

## 2017-10-14 DIAGNOSIS — I10 Essential (primary) hypertension: Secondary | ICD-10-CM

## 2017-10-19 ENCOUNTER — Other Ambulatory Visit: Payer: Self-pay | Admitting: Interventional Cardiology

## 2017-10-26 ENCOUNTER — Other Ambulatory Visit: Payer: Self-pay | Admitting: Interventional Cardiology

## 2017-11-06 ENCOUNTER — Other Ambulatory Visit: Payer: Self-pay | Admitting: Interventional Cardiology

## 2017-11-08 ENCOUNTER — Other Ambulatory Visit: Payer: Self-pay | Admitting: Interventional Cardiology

## 2017-11-08 DIAGNOSIS — I10 Essential (primary) hypertension: Secondary | ICD-10-CM

## 2017-11-09 ENCOUNTER — Ambulatory Visit (INDEPENDENT_AMBULATORY_CARE_PROVIDER_SITE_OTHER): Payer: Medicare Other | Admitting: Interventional Cardiology

## 2017-11-09 ENCOUNTER — Encounter: Payer: Self-pay | Admitting: Interventional Cardiology

## 2017-11-09 VITALS — BP 116/60 | HR 52 | Ht 71.0 in | Wt 266.8 lb

## 2017-11-09 DIAGNOSIS — G4733 Obstructive sleep apnea (adult) (pediatric): Secondary | ICD-10-CM

## 2017-11-09 DIAGNOSIS — I10 Essential (primary) hypertension: Secondary | ICD-10-CM

## 2017-11-09 DIAGNOSIS — E785 Hyperlipidemia, unspecified: Secondary | ICD-10-CM | POA: Diagnosis not present

## 2017-11-09 DIAGNOSIS — I739 Peripheral vascular disease, unspecified: Secondary | ICD-10-CM

## 2017-11-09 DIAGNOSIS — I2510 Atherosclerotic heart disease of native coronary artery without angina pectoris: Secondary | ICD-10-CM

## 2017-11-09 DIAGNOSIS — Z9989 Dependence on other enabling machines and devices: Secondary | ICD-10-CM

## 2017-11-09 NOTE — Progress Notes (Signed)
Cardiology Office Note    Date:  11/09/2017   ID:  Kenneth Lee, DOB [birth year], MRN [id number]  PCP:  Seward Carol, MD  Cardiologist: Sinclair Grooms, MD   Chief Complaint  Patient presents with  . Congestive Heart Failure    History of Present Illness:  Kenneth Lee is a 72 y.o. male who presents for CAD with her mental stent RCA 2008, bilateral carotid disease, sinus node dysfunction, hypertension, hyperlipidemia, and obstructive sleep apnea.   Patient presents today for evaluation of possible concerns for claudication.  Is having bilateral hip thigh discomfort with ambulation.  He has to stop and rest for up to 4 to 5 minutes for the discomfort to resolve.  Walking at the same pace and distance causes her discomfort to recur.  It is getting progressively more limiting over time.  Symptoms are similar to those present prior to bathroom bypass greater than 10 years ago.  He is not having angina.  He denies dyspnea.  His back no longer hurts after multiple operations.  He recently saw [name] who recommended that we reconsider the possibility of lower extremity claudication.   Past Medical History:  Diagnosis Date  . Arthritis   . Carotid disease, bilateral ([place])   . Chronic back pain    stenosis  . Coronary artery disease    with bare mental stent 2008 and RCA is Card   Dr.Elianny Buxbaum  . Diabetes mellitus without complication ([place])    takes Metformin daily  . Headache    occasionally  . History of colon polyps    benign  . Hyperlipidemia    takes Crestor daily  . Hypertension    takes Amlodipine,Bystolic,Apresoline,and Lisinopril daily.   . Joint pain   . Other specified cardiac dysrhythmias(427.89)   . Peripheral edema    takes Lasix daily  . Peripheral neuropathy   . Sinoatrial node dysfunction ([facility])   . Sleep apnea   . Weakness    numbness and tingling,left leg    Past Surgical History:  Procedure Laterality Date  . aortobifemoral bypass  1996  .  BACK SURGERY  2016   fusion  . CARDIAC CATHETERIZATION  2008  . CHOLECYSTECTOMY  2001  . COLONOSCOPY    . CORONARY ANGIOPLASTY     1 stent  . discectomy  20 yrs ago  . ESOPHAGOGASTRODUODENOSCOPY      Current Medications: Outpatient Medications Prior to Visit  Medication Sig Dispense Refill  . amLODipine (NORVASC) 5 MG tablet Take 1 tablet (5 mg total) by mouth daily. 30 tablet 0  . aspirin EC 81 MG tablet Take 1 tablet (81 mg total) by mouth daily.    . fluticasone (FLONASE) 50 MCG/ACT nasal spray Place 2 sprays into both nostrils daily.  5  . furosemide (LASIX) 40 MG tablet Take 1 tablet (40 mg total) by mouth daily. Please make overdue yearly appt with Dr. Tamala Julian before anymore refills. 1st attempt 90 tablet 2  . hydrALAZINE (APRESOLINE) 50 MG tablet Take 1 tablet (50 mg total) by mouth 3 (three) times daily. Please keep upcoming appt in May for future refills. Thank you 90 tablet 1  . lisinopril (PRINIVIL,ZESTRIL) 20 MG tablet TAKE 1 TABLET BY MOUTH 2 TIMES DAILY. PLEASE KEEP YOUR UPCOMING APPT FOR FUTURE REFILLS. 180 tablet 0  . meloxicam (MOBIC) 15 MG tablet Take 15 mg by mouth daily.  7  . metFORMIN (GLUCOPHAGE) 500 MG tablet Take 500 mg by mouth daily with breakfast.     .  Multiple Vitamin (MULTIVITAMIN WITH MINERALS) TABS tablet Take 1 tablet by mouth daily.    . nebivolol (BYSTOLIC) 5 MG tablet Take 1 tablet (5 mg total) by mouth daily. Please keep 5/2 upcoming appointment for additional refills thanks. 30 tablet 1  . nitroGLYCERIN (NITROSTAT) 0.4 MG SL tablet Place 0.4 mg under the tongue every 5 (five) minutes as needed for chest pain.    . Omega-3 Fatty Acids (FISH OIL) 1000 MG CAPS Take 1,000 mg by mouth daily.     . rosuvastatin (CRESTOR) 10 MG tablet Take 1 tablet (10 mg total) by mouth daily. Please keep upcoming appt in May before anymore refills. Thank you 90 tablet 0  . spironolactone (ALDACTONE) 25 MG tablet Take 1 tablet (25 mg total) by mouth daily. Please keep  upcoming appt for future refills. Thank you 90 tablet 0  . methocarbamol (ROBAXIN) 500 MG tablet Take 1 tablet (500 mg total) by mouth every 6 (six) hours as needed for muscle spasms. (Patient not taking: Reported on 11/09/2017) 60 tablet 3   No facility-administered medications prior to visit.      Allergies:   Penicillins   Social History   Socioeconomic History  . Marital status: Married    Spouse name: Not on file  . Number of children: Not on file  . Years of education: Not on file  . Highest education level: Not on file  Occupational History  . Not on file  Social Needs  . Financial resource strain: Not on file  . Food insecurity:    Worry: Not on file    Inability: Not on file  . Transportation needs:    Medical: Not on file    Non-medical: Not on file  Tobacco Use  . Smoking status: Former Research scientist (life sciences)  . Smokeless tobacco: Never Used  . Tobacco comment: history of smoking-quit a yr ago  Substance and Sexual Activity  . Alcohol use: Yes    Comment: socially  . Drug use: No  . Sexual activity: Not on file  Lifestyle  . Physical activity:    Days per week: Not on file    Minutes per session: Not on file  . Stress: Not on file  Relationships  . Social connections:    Talks on phone: Not on file    Gets together: Not on file    Attends religious service: Not on file    Active member of club or organization: Not on file    Attends meetings of clubs or organizations: Not on file    Relationship status: Not on file  Other Topics Concern  . Not on file  Social History Narrative  . Not on file     Family History:  The patient's family history includes Cancer in his mother; Heart attack in his mother; Heart disease in his mother; Hyperlipidemia in his other; Hypertension in his father, mother, and other.   ROS:   Please see the history of present illness.    Snoring, difficulty with balance, numbness in both feet, dizziness, back pain, shortness of breath with  excessive activity.  No sores on his feet. All other systems reviewed and are negative.   PHYSICAL EXAM:   VS:  BP 116/60   Pulse (!) 52   Ht 5\' 11"  (1.803 m)   Wt 266 lb 12.8 oz (121 kg)   BMI 37.21 kg/m    GEN: Well nourished, well developed, in no acute distress.  Severe obesity. HEENT: normal  Neck: no JVD,  carotid bruits, or masses Cardiac: RRR; no murmurs, rubs, or gallops,no edema.  Absent pedal pulses in the left foot.  Absent left popliteal pulse.  1-2+ right popliteal and 2+ right dorsalis pedis pulse.  2+ femoral pulses bilaterally.  Bilateral femoral bruits are heard. Respiratory:  clear to auscultation bilaterally, normal work of breathing GI: soft, nontender, nondistended, + BS MS: no deformity or atrophy  Skin: warm and dry, no rash Neuro:  Alert and Oriented x 3, Strength and sensation are intact Psych: euthymic mood, full affect  Wt Readings from Last 3 Encounters:  11/09/17 266 lb 12.8 oz (121 kg)  06/14/16 268 lb 3.2 oz (121.7 kg)  02/09/16 260 lb 1.9 oz (118 kg)      Studies/Labs Reviewed:   EKG:  EKG normal sinus rhythm with poor R wave progression and first-degree AV block.  Recent Labs: No results found for requested labs within last 8760 hours.   Lipid Panel    Component Value Date/Time   CHOL  03/29/2007 0015    194        ATP III CLASSIFICATION:  <200     mg/dL   Desirable  200-239  mg/dL   Borderline High  >=240    mg/dL   High   TRIG 140 03/29/2007 0015   HDL 39 (L) 03/29/2007 0015   CHOLHDL 5.0 03/29/2007 0015   VLDL 28 03/29/2007 0015   LDLCALC (H) 03/29/2007 0015    127        Total Cholesterol/HDL:CHD Risk Coronary Heart Disease Risk Table                     Men   Women  1/2 Average Risk   3.4   3.3    Additional studies/ records that were reviewed today include:  None    ASSESSMENT:    1. Coronary artery disease involving native coronary artery of native heart without angina pectoris   2. Claudication (Kenneth Lee)   3.  Essential hypertension   4. Hyperlipidemia with target LDL less than 70   5. OSA on CPAP      PLAN:  In order of problems listed above:  1. Denies angina..  No specific work-up is needed.  He should report chest discomfort.  Aggressive secondary prevention with LDL less than 70, A1c less than 7, blood pressure controlled 130/80 mmHg OS. 2. History of bifemoral bypass greater than 10 years ago.  Previously followed by Dr. [name] but has not seen him in over 3 years.  We will plan bilateral lower extremity Doppler and duplex study to exclude claudication in the setting.  The differential may include neurogenic pain related to lumbar disc disease and spinal stenosis. 3. Target blood pressure 130/80 mmHg or less. 4. LDL target less than 70. 5. Advocated wearing CPAP for sleep apnea.  Clinical follow-up in 1 year.  After lower extremity Doppler study may need to refer either to interventional PV but more likely back to his vascular surgeon, Dr. [name]  Medication Adjustments/Labs and Tests Ordered: Current medicines are reviewed at length with the patient today.  Concerns regarding medicines are outlined above.  Medication changes, Labs and Tests ordered today are listed in the Patient Instructions below. Patient Instructions  Medication Instructions:  Your physician recommends that you continue on your current medications as directed. Please refer to the Current Medication list given to you today.  Labwork: None  Testing/Procedures: Your physician has requested that you have a lower or upper  extremity arterial duplex. This test is an ultrasound of the arteries in the legs or arms. It looks at arterial blood flow in the legs and arms. Allow one hour for Lower and Upper Arterial scans. There are no restrictions or special instructions  Follow-Up: Your physician wants you to follow-up in: 1 year with Dr. Tamala Julian. You will receive a reminder letter in the mail two months in advance. If you  don't receive a letter, please call our office to schedule the follow-up appointment.   Any Other Special Instructions Will Be Listed Below (If Applicable).     If you need a refill on your cardiac medications before your next appointment, please call your pharmacy.      Signed, Sinclair Grooms, MD  11/09/2017 4:01 PM    [facility] [address], [place], [place]  [address] Phone: [phone]; Fax: [phone]

## 2017-11-09 NOTE — Patient Instructions (Signed)
Medication Instructions:  Your physician recommends that you continue on your current medications as directed. Please refer to the Current Medication list given to you today.  Labwork: None  Testing/Procedures: Your physician has requested that you have a lower or upper extremity arterial duplex. This test is an ultrasound of the arteries in the legs or arms. It looks at arterial blood flow in the legs and arms. Allow one hour for Lower and Upper Arterial scans. There are no restrictions or special instructions  Follow-Up: Your physician wants you to follow-up in: 1 year with Dr. Tamala Julian. You will receive a reminder letter in the mail two months in advance. If you don't receive a letter, please call our office to schedule the follow-up appointment.   Any Other Special Instructions Will Be Listed Below (If Applicable).     If you need a refill on your cardiac medications before your next appointment, please call your pharmacy.

## 2017-11-14 ENCOUNTER — Other Ambulatory Visit: Payer: Self-pay | Admitting: Interventional Cardiology

## 2017-11-14 DIAGNOSIS — I739 Peripheral vascular disease, unspecified: Secondary | ICD-10-CM

## 2017-11-15 DIAGNOSIS — I2510 Atherosclerotic heart disease of native coronary artery without angina pectoris: Secondary | ICD-10-CM | POA: Diagnosis not present

## 2017-11-15 DIAGNOSIS — I739 Peripheral vascular disease, unspecified: Secondary | ICD-10-CM | POA: Diagnosis not present

## 2017-11-15 DIAGNOSIS — E7800 Pure hypercholesterolemia, unspecified: Secondary | ICD-10-CM | POA: Diagnosis not present

## 2017-11-15 DIAGNOSIS — G4730 Sleep apnea, unspecified: Secondary | ICD-10-CM | POA: Diagnosis not present

## 2017-11-15 DIAGNOSIS — E1151 Type 2 diabetes mellitus with diabetic peripheral angiopathy without gangrene: Secondary | ICD-10-CM | POA: Diagnosis not present

## 2017-11-15 DIAGNOSIS — I10 Essential (primary) hypertension: Secondary | ICD-10-CM | POA: Diagnosis not present

## 2017-11-16 ENCOUNTER — Other Ambulatory Visit: Payer: Self-pay | Admitting: Interventional Cardiology

## 2017-11-21 ENCOUNTER — Other Ambulatory Visit: Payer: Self-pay | Admitting: Interventional Cardiology

## 2017-11-21 ENCOUNTER — Ambulatory Visit (HOSPITAL_COMMUNITY)
Admission: RE | Admit: 2017-11-21 | Discharge: 2017-11-21 | Disposition: A | Discharge status: Home / Self Care | Payer: Medicare Other | Source: Ambulatory Visit | Attending: Cardiovascular Disease | Admitting: Cardiovascular Disease

## 2017-11-21 DIAGNOSIS — I739 Peripheral vascular disease, unspecified: Secondary | ICD-10-CM

## 2017-11-22 ENCOUNTER — Telehealth: Payer: Self-pay | Admitting: Interventional Cardiology

## 2017-11-22 DIAGNOSIS — I739 Peripheral vascular disease, unspecified: Secondary | ICD-10-CM

## 2017-11-22 NOTE — Telephone Encounter (Signed)
Informed pt of results. Pt verbalized understanding. 

## 2017-11-22 NOTE — Telephone Encounter (Signed)
Follow Up:; ° ° °Returning your call. °

## 2017-11-27 DIAGNOSIS — H6123 Impacted cerumen, bilateral: Secondary | ICD-10-CM | POA: Diagnosis not present

## 2017-12-08 ENCOUNTER — Other Ambulatory Visit: Payer: Self-pay | Admitting: Interventional Cardiology

## 2017-12-08 DIAGNOSIS — I10 Essential (primary) hypertension: Secondary | ICD-10-CM

## 2017-12-08 DIAGNOSIS — H6522 Chronic serous otitis media, left ear: Secondary | ICD-10-CM | POA: Diagnosis not present

## 2017-12-19 DIAGNOSIS — H906 Mixed conductive and sensorineural hearing loss, bilateral: Secondary | ICD-10-CM | POA: Diagnosis not present

## 2017-12-19 DIAGNOSIS — H6522 Chronic serous otitis media, left ear: Secondary | ICD-10-CM | POA: Diagnosis not present

## 2017-12-20 ENCOUNTER — Other Ambulatory Visit: Payer: Self-pay

## 2017-12-20 ENCOUNTER — Ambulatory Visit (INDEPENDENT_AMBULATORY_CARE_PROVIDER_SITE_OTHER): Payer: Medicare Other | Admitting: Vascular Surgery

## 2017-12-20 ENCOUNTER — Encounter

## 2017-12-20 ENCOUNTER — Encounter: Payer: Self-pay | Admitting: *Deleted

## 2017-12-20 ENCOUNTER — Encounter: Payer: Self-pay | Admitting: Vascular Surgery

## 2017-12-20 ENCOUNTER — Other Ambulatory Visit: Payer: Self-pay | Admitting: *Deleted

## 2017-12-20 VITALS — BP 155/64 | HR 50 | Resp 50 | Ht 71.0 in | Wt 265.0 lb

## 2017-12-20 DIAGNOSIS — I70219 Atherosclerosis of native arteries of extremities with intermittent claudication, unspecified extremity: Secondary | ICD-10-CM | POA: Diagnosis not present

## 2017-12-20 NOTE — H&P (View-Only) (Signed)
Patient name: Kenneth Lee MRN: [id number] DOB: 10/15/1945 Sex: male   Lee FOR CONSULT:    Claudication.  The consult is requested by Dr. Delfina Redwood.   HPI:   Kenneth Lee is a pleasant 72 y.o. male, who I believe underwent an emergent aortobifemoral bypass graft around 15 years ago by myself although I cannot locate this operative report.  I have asked medical records to find this.  He presents today with claudication.  He experiences pain in both hips which is brought on by ambulation and relieved with rest.  His symptoms are equal on both sides.  I really do not get any history of thigh or calf claudication on either side.  He does describe some pain in his knees and tightness in his left calf which occurs even at rest.  He has no history of rest pain or nonhealing wounds.  He does have a history of significant back issues and is undergone 2 previous back operations.  I have reviewed the records from Dr. Lina Sar office.  Patient has a history of diabetes which is under good contro  He also has a history of hypertension which has been under good control.  In addition he has hyperlipidemia.  Past Medical History:  Diagnosis Date  . Arthritis   . Carotid disease, bilateral ([place])   . Chronic back pain    stenosis  . Coronary artery disease    with bare mental stent 2008 and RCA is Card   Dr.Smith  . Diabetes mellitus without complication ([place])    takes Metformin daily  . Headache    occasionally  . History of colon polyps    benign  . Hyperlipidemia    takes Crestor daily  . Hypertension    takes Amlodipine,Bystolic,Apresoline,and Lisinopril daily.   . Joint pain   . Other specified cardiac dysrhythmias(427.89)   . Peripheral edema    takes Lasix daily  . Peripheral neuropathy   . Sinoatrial node dysfunction (HCC)   . Sleep apnea   . Weakness    numbness and tingling,left leg    Family History  Problem Relation Age of Onset  . Heart disease Mother   . Cancer  Mother   . Heart attack Mother   . Hypertension Mother   . Hypertension Father   . Hypertension Other   . Hyperlipidemia Other   . Stroke Neg Hx     SOCIAL HISTORY: He has smoked on and off for many years and quit approximately 2 months ago. Social History   Socioeconomic History  . Marital status: Married    Spouse name: Not on file  . Number of children: Not on file  . Years of education: Not on file  . Highest education level: Not on file  Occupational History  . Not on file  Social Needs  . Financial resource strain: Not on file  . Food insecurity:    Worry: Not on file    Inability: Not on file  . Transportation needs:    Medical: Not on file    Non-medical: Not on file  Tobacco Use  . Smoking status: Former Research scientist (life sciences)  . Smokeless tobacco: Never Used  . Tobacco comment: history of smoking-quit a yr ago  Substance and Sexual Activity  . Alcohol use: Yes    Comment: socially  . Drug use: No  . Sexual activity: Not on file  Lifestyle  . Physical activity:    Days per week: Not on file  Minutes per session: Not on file  . Stress: Not on file  Relationships  . Social connections:    Talks on phone: Not on file    Gets together: Not on file    Attends religious service: Not on file    Active member of club or organization: Not on file    Attends meetings of clubs or organizations: Not on file    Relationship status: Not on file  . Intimate partner violence:    Fear of current or ex partner: Not on file    Emotionally abused: Not on file    Physically abused: Not on file    Forced sexual activity: Not on file  Other Topics Concern  . Not on file  Social History Narrative  . Not on file    Allergies  Allergen Reactions  . Penicillins Swelling    Has patient had a PCN reaction causing immediate rash, facial/tongue/throat swelling, SOB or lightheadedness with hypotension: Yes Has patient had a PCN reaction causing severe rash involving mucus membranes or  skin necrosis: No Has patient had a PCN reaction that required hospitalization No Has patient had a PCN reaction occurring within the last 10 years: No If all of the above answers are "NO", then may proceed with Cephalosporin use.    Current Outpatient Medications  Medication Sig Dispense Refill  . amLODipine (NORVASC) 5 MG tablet TAKE 1 TABLET BY MOUTH EVERY DAY 30 tablet 11  . aspirin EC 81 MG tablet Take 1 tablet (81 mg total) by mouth daily.    . fluticasone (FLONASE) 50 MCG/ACT nasal spray Place 2 sprays into both nostrils daily.  5  . furosemide (LASIX) 40 MG tablet Take 1 tablet (40 mg total) by mouth daily. Please make overdue yearly appt with Dr. Tamala Julian before anymore refills. 1st attempt 90 tablet 2  . hydrALAZINE (APRESOLINE) 50 MG tablet Take 1 tablet (50 mg total) by mouth 3 (three) times daily. 270 tablet 3  . lisinopril (PRINIVIL,ZESTRIL) 20 MG tablet TAKE 1 TABLET BY MOUTH 2 TIMES DAILY. PLEASE KEEP YOUR UPCOMING APPT FOR FUTURE REFILLS. 180 tablet 0  . meloxicam (MOBIC) 15 MG tablet Take 15 mg by mouth daily.  7  . metFORMIN (GLUCOPHAGE) 500 MG tablet Take 500 mg by mouth daily with breakfast.     . Multiple Vitamin (MULTIVITAMIN WITH MINERALS) TABS tablet Take 1 tablet by mouth daily.    . nebivolol (BYSTOLIC) 5 MG tablet Take 1 tablet (5 mg total) by mouth daily. Please keep 5/2 upcoming appointment for additional refills thanks. 30 tablet 1  . nitroGLYCERIN (NITROSTAT) 0.4 MG SL tablet Place 0.4 mg under the tongue every 5 (five) minutes as needed for chest pain.    . Omega-3 Fatty Acids (FISH OIL) 1000 MG CAPS Take 1,000 mg by mouth daily.     . rosuvastatin (CRESTOR) 10 MG tablet Take 1 tablet (10 mg total) by mouth daily. Please keep upcoming appt in May before anymore refills. Thank you 90 tablet 0  . spironolactone (ALDACTONE) 25 MG tablet Take 1 tablet (25 mg total) by mouth daily. 90 tablet 4  . nebivolol (BYSTOLIC) 5 MG tablet Take 1 tablet (5 mg total) by mouth  daily. (Patient not taking: Reported on 12/20/2017) 30 tablet 11   No current facility-administered medications for this visit.     REVIEW OF SYSTEMS:  [X]  denotes positive finding, [ ]  denotes negative finding Cardiac  Comments:  Chest pain or chest pressure:    Shortness  of breath upon exertion:    Short of breath when lying flat:    Irregular heart rhythm:        Vascular    Pain in calf, thigh, or hip brought on by ambulation: x   Pain in feet at night that wakes you up from your sleep:  x   Blood clot in your veins:    Leg swelling:         Pulmonary    Oxygen at home:    Productive cough:     Wheezing:         Neurologic    Sudden weakness in arms or legs:     Sudden numbness in arms or legs:     Sudden onset of difficulty speaking or slurred speech:    Temporary loss of vision in one eye:     Problems with dizziness:         Gastrointestinal    Blood in stool:     Vomited blood:         Genitourinary    Burning when urinating:     Blood in urine:        Psychiatric    Major depression:         Hematologic    Bleeding problems:    Problems with blood clotting too easily:        Skin    Rashes or ulcers:        Constitutional    Fever or chills:     PHYSICAL EXAM:   Vitals:   12/20/17 1033 12/20/17 1035  BP: (!) 164/66 (!) 155/64  Pulse: (!) 50   Resp: (!) 50   SpO2: 98%   Weight: 265 lb (120.2 kg)   Height: 5\' 11"  (1.803 m)     GENERAL: The patient is a well-nourished male, in no acute distress. The vital signs are documented above. CARDIAC: There is a regular rate and rhythm.  VASCULAR: The patient has a soft left carotid bruit. He has palpable femoral pulses bilaterally. On the right side he has a palpable dorsalis pedis pulse. On the left side I cannot palpate pedal pulses. He has mild bilateral lower extremity swelling. PULMONARY: There is good air exchange bilaterally without wheezing or rales. ABDOMEN: Soft and non-tender with normal  pitched bowel sounds.  MUSCULOSKELETAL: There are no major deformities or cyanosis. NEUROLOGIC: No focal weakness or paresthesias are detected. SKIN: There are no ulcers or rashes noted. PSYCHIATRIC: The patient has a normal affect.  DATA:    ARTERIAL DOPPLER STUDY: I have reviewed the arterial Doppler study that was done elsewhere on 11/21/2017.  On the right side there is a biphasic dorsalis pedis signal.  The posterior tibial signal could not be obtained.  There is a biphasic peroneal signal on the right.  ABI on the right is 83%.  Toe pressure is 96 mmHg.  On the left side there is monophasic flow in the common femoral artery.  There are monophasic signals in the dorsalis pedis and posterior tibial positions ABI on the left is 52% with a toe pressure of 0  ARTERIAL DUPLEX: I have reviewed the arterial duplex scan that was done on 11/21/2017.    His aortobifemoral bypass graft is patent. There is noted to be a 75 to 99% stenosis in the proximal and mid superficial femoral artery on the right.  However there is biphasic flow throughout the superficial femoral artery.  On the left side there is a 76  to 99% stenosis in the proximal superficial femoral artery.  LABS: I have reviewed his labs from his most recent office visit on 11/15/2017.  Hemoglobin A1c was 6.8.  LDL cholesterol was 62.  Creatinine was 1.04 with a GFR greater than 60.  He did have a carotid duplex scan 2 years ago which showed less than 39% carotid stenosis bilaterally.  MEDICAL ISSUES:   PERIPHERAL VASCULAR DISEASE WITH CLAUDICATION: This patient describes pain in both hips which is brought on by ambulation relieved with rest.  He has an ABI of 83% on the right and 52% on the left.  His symptoms are equal on both sides are not totally convinced that his symptoms can be attributed to his peripheral vascular disease.  He also has some back issues.  I am more concerned about his ABI on the left which is 52% and he has monophasic  waveforms from the common femoral artery down.  He has palpable pedal pulses so I think his aortobifemoral bypass graft is patent.  However he may have developed significant progression of disease on the left distal to the bypass which could potentially put him at risk for occlusion of the left limb of his graft.  This Lee I recommended that we proceed with arteriography.  I have reviewed with the patient the indications for arteriography. In addition, I have reviewed the potential complications of arteriography including but not limited to: Bleeding, arterial injury, arterial thrombosis, dye action, renal insufficiency, or other unpredictable medical problems. I have explained to the patient that if we find disease amenable to angioplasty we could potentially address this at the same time. I have discussed the potential complications of angioplasty and stenting, including but not limited to: Bleeding, arterial thrombosis, arterial injury, dissection, or the need for surgical intervention.  This procedure is scheduled for 12/29/2017.  I will make further recommendations pending these results.  ADDENDUM: Medical records was able to locate his operative report.  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  I performed an aortobifemoral bypass graft.   [name] [facility] [phone]

## 2017-12-20 NOTE — Progress Notes (Signed)
Patient name: Kenneth Lee MRN: [id number] DOB: 08-Oct-1945 Sex: male   REASON FOR CONSULT:    Claudication.  The consult is requested by Dr. Delfina Redwood.   HPI:   Kenneth Lee is a pleasant 72 y.o. male, who I believe underwent an emergent aortobifemoral bypass graft around 15 years ago by myself although I cannot locate this operative report.  I have asked medical records to find this.  He presents today with claudication.  He experiences pain in both hips which is brought on by ambulation and relieved with rest.  His symptoms are equal on both sides.  I really do not get any history of thigh or calf claudication on either side.  He does describe some pain in his knees and tightness in his left calf which occurs even at rest.  He has no history of rest pain or nonhealing wounds.  He does have a history of significant back issues and is undergone 2 previous back operations.  I have reviewed the records from Dr. Lina Sar office.  Patient has a history of diabetes which is under good contro  He also has a history of hypertension which has been under good control.  In addition he has hyperlipidemia.  Past Medical History:  Diagnosis Date  . Arthritis   . Carotid disease, bilateral ([place])   . Chronic back pain    stenosis  . Coronary artery disease    with bare mental stent 2008 and RCA is Card   Dr.Smith  . Diabetes mellitus without complication ([place])    takes Metformin daily  . Headache    occasionally  . History of colon polyps    benign  . Hyperlipidemia    takes Crestor daily  . Hypertension    takes Amlodipine,Bystolic,Apresoline,and Lisinopril daily.   . Joint pain   . Other specified cardiac dysrhythmias(427.89)   . Peripheral edema    takes Lasix daily  . Peripheral neuropathy   . Sinoatrial node dysfunction (HCC)   . Sleep apnea   . Weakness    numbness and tingling,left leg    Family History  Problem Relation Age of Onset  . Heart disease Mother   . Cancer  Mother   . Heart attack Mother   . Hypertension Mother   . Hypertension Father   . Hypertension Other   . Hyperlipidemia Other   . Stroke Neg Hx     SOCIAL HISTORY: He has smoked on and off for many years and quit approximately 2 months ago. Social History   Socioeconomic History  . Marital status: Married    Spouse name: Not on file  . Number of children: Not on file  . Years of education: Not on file  . Highest education level: Not on file  Occupational History  . Not on file  Social Needs  . Financial resource strain: Not on file  . Food insecurity:    Worry: Not on file    Inability: Not on file  . Transportation needs:    Medical: Not on file    Non-medical: Not on file  Tobacco Use  . Smoking status: Former Research scientist (life sciences)  . Smokeless tobacco: Never Used  . Tobacco comment: history of smoking-quit a yr ago  Substance and Sexual Activity  . Alcohol use: Yes    Comment: socially  . Drug use: No  . Sexual activity: Not on file  Lifestyle  . Physical activity:    Days per week: Not on file  Minutes per session: Not on file  . Stress: Not on file  Relationships  . Social connections:    Talks on phone: Not on file    Gets together: Not on file    Attends religious service: Not on file    Active member of club or organization: Not on file    Attends meetings of clubs or organizations: Not on file    Relationship status: Not on file  . Intimate partner violence:    Fear of current or ex partner: Not on file    Emotionally abused: Not on file    Physically abused: Not on file    Forced sexual activity: Not on file  Other Topics Concern  . Not on file  Social History Narrative  . Not on file    Allergies  Allergen Reactions  . Penicillins Swelling    Has patient had a PCN reaction causing immediate rash, facial/tongue/throat swelling, SOB or lightheadedness with hypotension: Yes Has patient had a PCN reaction causing severe rash involving mucus membranes or  skin necrosis: No Has patient had a PCN reaction that required hospitalization No Has patient had a PCN reaction occurring within the last 10 years: No If all of the above answers are "NO", then may proceed with Cephalosporin use.    Current Outpatient Medications  Medication Sig Dispense Refill  . amLODipine (NORVASC) 5 MG tablet TAKE 1 TABLET BY MOUTH EVERY DAY 30 tablet 11  . aspirin EC 81 MG tablet Take 1 tablet (81 mg total) by mouth daily.    . fluticasone (FLONASE) 50 MCG/ACT nasal spray Place 2 sprays into both nostrils daily.  5  . furosemide (LASIX) 40 MG tablet Take 1 tablet (40 mg total) by mouth daily. Please make overdue yearly appt with Dr. Tamala Julian before anymore refills. 1st attempt 90 tablet 2  . hydrALAZINE (APRESOLINE) 50 MG tablet Take 1 tablet (50 mg total) by mouth 3 (three) times daily. 270 tablet 3  . lisinopril (PRINIVIL,ZESTRIL) 20 MG tablet TAKE 1 TABLET BY MOUTH 2 TIMES DAILY. PLEASE KEEP YOUR UPCOMING APPT FOR FUTURE REFILLS. 180 tablet 0  . meloxicam (MOBIC) 15 MG tablet Take 15 mg by mouth daily.  7  . metFORMIN (GLUCOPHAGE) 500 MG tablet Take 500 mg by mouth daily with breakfast.     . Multiple Vitamin (MULTIVITAMIN WITH MINERALS) TABS tablet Take 1 tablet by mouth daily.    . nebivolol (BYSTOLIC) 5 MG tablet Take 1 tablet (5 mg total) by mouth daily. Please keep 5/2 upcoming appointment for additional refills thanks. 30 tablet 1  . nitroGLYCERIN (NITROSTAT) 0.4 MG SL tablet Place 0.4 mg under the tongue every 5 (five) minutes as needed for chest pain.    . Omega-3 Fatty Acids (FISH OIL) 1000 MG CAPS Take 1,000 mg by mouth daily.     . rosuvastatin (CRESTOR) 10 MG tablet Take 1 tablet (10 mg total) by mouth daily. Please keep upcoming appt in May before anymore refills. Thank you 90 tablet 0  . spironolactone (ALDACTONE) 25 MG tablet Take 1 tablet (25 mg total) by mouth daily. 90 tablet 4  . nebivolol (BYSTOLIC) 5 MG tablet Take 1 tablet (5 mg total) by mouth  daily. (Patient not taking: Reported on 12/20/2017) 30 tablet 11   No current facility-administered medications for this visit.     REVIEW OF SYSTEMS:  [X]  denotes positive finding, [ ]  denotes negative finding Cardiac  Comments:  Chest pain or chest pressure:    Shortness  of breath upon exertion:    Short of breath when lying flat:    Irregular heart rhythm:        Vascular    Pain in calf, thigh, or hip brought on by ambulation: x   Pain in feet at night that wakes you up from your sleep:  x   Blood clot in your veins:    Leg swelling:         Pulmonary    Oxygen at home:    Productive cough:     Wheezing:         Neurologic    Sudden weakness in arms or legs:     Sudden numbness in arms or legs:     Sudden onset of difficulty speaking or slurred speech:    Temporary loss of vision in one eye:     Problems with dizziness:         Gastrointestinal    Blood in stool:     Vomited blood:         Genitourinary    Burning when urinating:     Blood in urine:        Psychiatric    Major depression:         Hematologic    Bleeding problems:    Problems with blood clotting too easily:        Skin    Rashes or ulcers:        Constitutional    Fever or chills:     PHYSICAL EXAM:   Vitals:   12/20/17 1033 12/20/17 1035  BP: (!) 164/66 (!) 155/64  Pulse: (!) 50   Resp: (!) 50   SpO2: 98%   Weight: 265 lb (120.2 kg)   Height: 5\' 11"  (1.803 m)     GENERAL: The patient is a well-nourished male, in no acute distress. The vital signs are documented above. CARDIAC: There is a regular rate and rhythm.  VASCULAR: The patient has a soft left carotid bruit. He has palpable femoral pulses bilaterally. On the right side he has a palpable dorsalis pedis pulse. On the left side I cannot palpate pedal pulses. He has mild bilateral lower extremity swelling. PULMONARY: There is good air exchange bilaterally without wheezing or rales. ABDOMEN: Soft and non-tender with normal  pitched bowel sounds.  MUSCULOSKELETAL: There are no major deformities or cyanosis. NEUROLOGIC: No focal weakness or paresthesias are detected. SKIN: There are no ulcers or rashes noted. PSYCHIATRIC: The patient has a normal affect.  DATA:    ARTERIAL DOPPLER STUDY: I have reviewed the arterial Doppler study that was done elsewhere on 11/21/2017.  On the right side there is a biphasic dorsalis pedis signal.  The posterior tibial signal could not be obtained.  There is a biphasic peroneal signal on the right.  ABI on the right is 83%.  Toe pressure is 96 mmHg.  On the left side there is monophasic flow in the common femoral artery.  There are monophasic signals in the dorsalis pedis and posterior tibial positions ABI on the left is 52% with a toe pressure of 0  ARTERIAL DUPLEX: I have reviewed the arterial duplex scan that was done on 11/21/2017.    His aortobifemoral bypass graft is patent. There is noted to be a 75 to 99% stenosis in the proximal and mid superficial femoral artery on the right.  However there is biphasic flow throughout the superficial femoral artery.  On the left side there is a 5  to 99% stenosis in the proximal superficial femoral artery.  LABS: I have reviewed his labs from his most recent office visit on 11/15/2017.  Hemoglobin A1c was 6.8.  LDL cholesterol was 62.  Creatinine was 1.04 with a GFR greater than 60.  He did have a carotid duplex scan 2 years ago which showed less than 39% carotid stenosis bilaterally.  MEDICAL ISSUES:   PERIPHERAL VASCULAR DISEASE WITH CLAUDICATION: This patient describes pain in both hips which is brought on by ambulation relieved with rest.  He has an ABI of 83% on the right and 52% on the left.  His symptoms are equal on both sides are not totally convinced that his symptoms can be attributed to his peripheral vascular disease.  He also has some back issues.  I am more concerned about his ABI on the left which is 52% and he has monophasic  waveforms from the common femoral artery down.  He has palpable pedal pulses so I think his aortobifemoral bypass graft is patent.  However he may have developed significant progression of disease on the left distal to the bypass which could potentially put him at risk for occlusion of the left limb of his graft.  This reason I recommended that we proceed with arteriography.  I have reviewed with the patient the indications for arteriography. In addition, I have reviewed the potential complications of arteriography including but not limited to: Bleeding, arterial injury, arterial thrombosis, dye action, renal insufficiency, or other unpredictable medical problems. I have explained to the patient that if we find disease amenable to angioplasty we could potentially address this at the same time. I have discussed the potential complications of angioplasty and stenting, including but not limited to: Bleeding, arterial thrombosis, arterial injury, dissection, or the need for surgical intervention.  This procedure is scheduled for 12/29/2017.  I will make further recommendations pending these results.  ADDENDUM: Medical records was able to locate his operative report.  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  I performed an aortobifemoral bypass graft.   [name] [facility] [phone]

## 2017-12-26 ENCOUNTER — Other Ambulatory Visit: Payer: Self-pay | Admitting: Interventional Cardiology

## 2017-12-29 ENCOUNTER — Encounter (HOSPITAL_COMMUNITY)
Admission: RE | Disposition: A | Discharge status: Home / Self Care | Payer: Self-pay | Source: Ambulatory Visit | Attending: Vascular Surgery

## 2017-12-29 ENCOUNTER — Ambulatory Visit (HOSPITAL_COMMUNITY)
Admission: RE | Admit: 2017-12-29 | Discharge: 2017-12-29 | Disposition: A | Discharge status: Home / Self Care | Payer: Medicare Other | Source: Ambulatory Visit | Attending: Vascular Surgery | Admitting: Vascular Surgery

## 2017-12-29 DIAGNOSIS — Z87891 Personal history of nicotine dependence: Secondary | ICD-10-CM | POA: Insufficient documentation

## 2017-12-29 DIAGNOSIS — Z7951 Long term (current) use of inhaled steroids: Secondary | ICD-10-CM | POA: Insufficient documentation

## 2017-12-29 DIAGNOSIS — G4730 Sleep apnea, unspecified: Secondary | ICD-10-CM | POA: Insufficient documentation

## 2017-12-29 DIAGNOSIS — Z9889 Other specified postprocedural states: Secondary | ICD-10-CM | POA: Insufficient documentation

## 2017-12-29 DIAGNOSIS — I499 Cardiac arrhythmia, unspecified: Secondary | ICD-10-CM | POA: Diagnosis not present

## 2017-12-29 DIAGNOSIS — I10 Essential (primary) hypertension: Secondary | ICD-10-CM | POA: Insufficient documentation

## 2017-12-29 DIAGNOSIS — E1151 Type 2 diabetes mellitus with diabetic peripheral angiopathy without gangrene: Secondary | ICD-10-CM | POA: Diagnosis not present

## 2017-12-29 DIAGNOSIS — Z880 Allergy status to penicillin: Secondary | ICD-10-CM | POA: Insufficient documentation

## 2017-12-29 DIAGNOSIS — I2510 Atherosclerotic heart disease of native coronary artery without angina pectoris: Secondary | ICD-10-CM | POA: Insufficient documentation

## 2017-12-29 DIAGNOSIS — Z8249 Family history of ischemic heart disease and other diseases of the circulatory system: Secondary | ICD-10-CM | POA: Insufficient documentation

## 2017-12-29 DIAGNOSIS — I70212 Atherosclerosis of native arteries of extremities with intermittent claudication, left leg: Secondary | ICD-10-CM | POA: Insufficient documentation

## 2017-12-29 DIAGNOSIS — E1140 Type 2 diabetes mellitus with diabetic neuropathy, unspecified: Secondary | ICD-10-CM | POA: Diagnosis not present

## 2017-12-29 DIAGNOSIS — I6523 Occlusion and stenosis of bilateral carotid arteries: Secondary | ICD-10-CM | POA: Insufficient documentation

## 2017-12-29 DIAGNOSIS — E785 Hyperlipidemia, unspecified: Secondary | ICD-10-CM | POA: Insufficient documentation

## 2017-12-29 DIAGNOSIS — Z86010 Personal history of colonic polyps: Secondary | ICD-10-CM | POA: Diagnosis not present

## 2017-12-29 DIAGNOSIS — Z79899 Other long term (current) drug therapy: Secondary | ICD-10-CM | POA: Diagnosis not present

## 2017-12-29 DIAGNOSIS — Z7984 Long term (current) use of oral hypoglycemic drugs: Secondary | ICD-10-CM | POA: Insufficient documentation

## 2017-12-29 DIAGNOSIS — T82898A Other specified complication of vascular prosthetic devices, implants and grafts, initial encounter: Secondary | ICD-10-CM | POA: Diagnosis not present

## 2017-12-29 DIAGNOSIS — Z7982 Long term (current) use of aspirin: Secondary | ICD-10-CM | POA: Diagnosis not present

## 2017-12-29 HISTORY — PX: LOWER EXTREMITY ANGIOGRAPHY: CATH118251

## 2017-12-29 HISTORY — PX: ABDOMINAL AORTOGRAM: CATH118222

## 2017-12-29 LAB — GLUCOSE, CAPILLARY
Glucose-Capillary: 154 mg/dL — ABNORMAL HIGH (ref 65–99)
Glucose-Capillary: 139 mg/dL — ABNORMAL HIGH (ref 65–99)

## 2017-12-29 LAB — POCT I-STAT, CHEM 8
BUN: 32 mg/dL — ABNORMAL HIGH (ref 6–20)
Calcium, Ion: 1.23 mmol/L (ref 1.15–1.40)
Chloride: 108 mmol/L (ref 101–111)
Creatinine, Ser: 1 mg/dL (ref 0.61–1.24)
Glucose, Bld: 158 mg/dL — ABNORMAL HIGH (ref 65–99)
HCT: 39 % (ref 39.0–52.0)
Hemoglobin: 13.3 g/dL (ref 13.0–17.0)
Potassium: 4.3 mmol/L (ref 3.5–5.1)
Sodium: 141 mmol/L (ref 135–145)
TCO2: 21 mmol/L — ABNORMAL LOW (ref 22–32)

## 2017-12-29 SURGERY — ABDOMINAL AORTOGRAM
Anesthesia: LOCAL

## 2017-12-29 MED ORDER — ONDANSETRON HCL 4 MG/2ML IJ SOLN
4.0000 mg | Freq: Four times a day (QID) | INTRAMUSCULAR | Status: DC | PRN
Start: 2017-12-29 — End: 2017-12-29

## 2017-12-29 MED ORDER — SODIUM CHLORIDE 0.9 % WEIGHT BASED INFUSION: 1.0000 mL/kg/h | INTRAVENOUS | Status: DC

## 2017-12-29 MED ORDER — ACETAMINOPHEN 325 MG PO TABS: 650.0000 mg | ORAL_TABLET | ORAL | Status: DC | PRN

## 2017-12-29 MED ORDER — SODIUM CHLORIDE 0.9 % IV SOLN
INTRAVENOUS | Status: DC
  Administered 2017-12-29: 08:00:00 via INTRAVENOUS

## 2017-12-29 MED ORDER — LABETALOL HCL 5 MG/ML IV SOLN: 10.0000 mg | INTRAVENOUS | Status: DC | PRN

## 2017-12-29 MED ORDER — FENTANYL CITRATE (PF) 100 MCG/2ML IJ SOLN
INTRAMUSCULAR | Status: AC
  Filled 2017-12-29: qty 2

## 2017-12-29 MED ORDER — IODIXANOL 320 MG/ML IV SOLN
INTRAVENOUS | Status: DC | PRN
  Administered 2017-12-29: 150 mL via INTRAVENOUS

## 2017-12-29 MED ORDER — LIDOCAINE HCL (PF) 1 % IJ SOLN
INTRAMUSCULAR | Status: DC | PRN
  Administered 2017-12-29: 18 mL

## 2017-12-29 MED ORDER — HYDRALAZINE HCL 20 MG/ML IJ SOLN: 5.0000 mg | INTRAMUSCULAR | Status: DC | PRN

## 2017-12-29 MED ORDER — FENTANYL CITRATE (PF) 100 MCG/2ML IJ SOLN
INTRAMUSCULAR | Status: DC | PRN
  Administered 2017-12-29: 50 ug via INTRAVENOUS

## 2017-12-29 MED ORDER — HEPARIN (PORCINE) IN NACL 2-0.9 UNITS/ML
INTRAMUSCULAR | Status: AC | PRN
  Administered 2017-12-29: 1000 mL

## 2017-12-29 MED ORDER — MIDAZOLAM HCL 2 MG/2ML IJ SOLN
INTRAMUSCULAR | Status: DC | PRN
  Administered 2017-12-29: 1 mg via INTRAVENOUS

## 2017-12-29 MED ORDER — SODIUM CHLORIDE 0.9% FLUSH: 3.0000 mL | INTRAVENOUS | Status: DC | PRN

## 2017-12-29 MED ORDER — HEPARIN (PORCINE) IN NACL 1000-0.9 UT/500ML-% IV SOLN
INTRAVENOUS | Status: AC
  Filled 2017-12-29: qty 500

## 2017-12-29 MED ORDER — LIDOCAINE HCL (PF) 1 % IJ SOLN
INTRAMUSCULAR | Status: AC
  Filled 2017-12-29: qty 30

## 2017-12-29 MED ORDER — MIDAZOLAM HCL 2 MG/2ML IJ SOLN
INTRAMUSCULAR | Status: AC
  Filled 2017-12-29: qty 2

## 2017-12-29 MED ORDER — SODIUM CHLORIDE 0.9 % IV SOLN: 250.0000 mL | INTRAVENOUS | Status: DC | PRN

## 2017-12-29 MED ORDER — SODIUM CHLORIDE 0.9% FLUSH: 3.0000 mL | Freq: Two times a day (BID) | INTRAVENOUS | Status: DC

## 2017-12-29 SURGICAL SUPPLY — 14 items
CATH ANGIO 5F PIGTAIL 65CM (CATHETERS) ×3 IMPLANT
CATH SOFTOUCH MOTARJEME 5F (CATHETERS) ×3 IMPLANT
COVER PRB 48X5XTLSCP FOLD TPE (BAG) ×2 IMPLANT
COVER PROBE 5X48 (BAG) ×1
KIT MICROPUNCTURE NIT STIFF (SHEATH) ×3 IMPLANT
KIT PV (KITS) ×3 IMPLANT
SHEATH PINNACLE 5F 10CM (SHEATH) ×3 IMPLANT
STOPCOCK MORSE 400PSI 3WAY (MISCELLANEOUS) ×3 IMPLANT
SYRINGE MEDRAD AVANTA MACH 7 (SYRINGE) ×3 IMPLANT
TRANSDUCER W/STOPCOCK (MISCELLANEOUS) ×3 IMPLANT
TRAY PV CATH (CUSTOM PROCEDURE TRAY) ×3 IMPLANT
TUBING CIL FLEX 10 FLL-RA (TUBING) ×3 IMPLANT
WIRE HITORQ VERSACORE ST 145CM (WIRE) ×3 IMPLANT
WIRE ROSEN-J .035X180CM (WIRE) ×3 IMPLANT

## 2017-12-29 NOTE — Discharge Instructions (Signed)

## 2017-12-29 NOTE — Op Note (Signed)
PATIENT: Kenneth Lee      MRN: [id number] DOB: Jun 06, 1946    DATE OF PROCEDURE: 12/29/2017  INDICATIONS:    Kenneth Lee is a 72 y.o. male who is referred with claudication of the left leg.  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  He was then lost to follow-up.  He describes left calf claudication and on duplex was found to have monophasic signal starting at the common femoral level with an ABI 52% on the left.  ABI on the right was 83%.  He appeared to have disease in the proximal SFA and distal common femoral artery and I was concerned that he might be at risk for graft thrombosis of the left limb of his graft.  He therefore presents for arteriography.  PROCEDURE:    1.  Conscious sedation 2.  Ultrasound-guided access to the right limb of his aortofemoral bypass graft 3.  Aortogram with bilateral iliac arteriogram 4.  Selective catheterization of the left limb of his aortofemoral bypass graft with left lower extremity runoff 5.  Retrograde right femoral arteriogram with right lower extremity runoff  SURGEON: Judeth Cornfield. [name], MD, FACS  ANESTHESIA: Local with sedation  EBL: Minimal patient was  TECHNIQUE: Brought to the peripheral vascular lab and was sedated.  The period of conscious sedation was 39 minutes.  During that time period, I was present face-to-face 100% of the time.  The patient was administered 1 mg of Versed and 50 mcg of fentanyl. The patient's heart rate, blood pressure, and oxygen saturation were monitored by the nurse continuously during the procedure.  Both groins were prepped and draped in the usual sterile fashion.  Under ultrasound guidance, after the skin was anesthetized, I cannulated the right limb of his aortofemoral bypass graft.  With a micropuncture needle and a micropuncture sheath was introduced over a wire.  This was exchanged for a 5 [place] sheath over a Bentson wire.  By  ultrasound the femoral artery was patent. A real-time image was obtained and placed in the chart.   The pigtail catheter was positioned at the L1 vertebral body and flush aortogram obtained.  The catheter was then exchanged for a shepherd's hook catheter which was positioned into the left limb of the aortofemoral bypass graft.  Left lower extremity arteriogram was obtained.  Next the catheter was removed over a wire and a retrograde right femoral arteriogram was obtained with right lower extremity runoff.  FINDINGS:   1.  His aortofemoral bypass graft is widely patent. 2.  On the left side, there is significant calcific plaque of the distal common femoral artery just below the anastomosis which extends into the deep femoral artery and partially into the superficial femoral artery.  Below that he has mild disease in the proximal superficial femoral artery and an occlusion in the distal superficial femoral artery with reconstitution of the above-knee popliteal artery.  There is moderate disease in the popliteal artery at the level of the knee.  There is three-vessel runoff in the left via the anterior tibial, posterior tibial, and peroneal arteries. 3.  On the right side the common femoral and deep femoral arteries are widely patent.  There is moderate diffuse disease throughout the superficial femoral artery.  The popliteal artery is patent with three-vessel runoff on the right via the anterior tibial, posterior tibial, and peroneal arteries.  CLINICAL NOTE: Given the plaque in the distal common femoral artery extending  into the deep femoral artery on the left I think he is at risk for thrombosis of the left limb of his graft.  This reason I recommended revision of the distal anastomosis of the left limb of his aortofemoral graft with endarterectomy.  He would like to discuss this further in the office before scheduling surgery.  I will make arrangements for an office visit to discuss this further and  also map his left great saphenous vein.  I do not think that I would need to address the short segment occlusion of the superficial femoral artery given that he has only mild claudication symptoms and I think the main issue is the risk of graft thrombosis on the left side given the disease just distal to the anastomosis.  [name], MD, FACS [facility]  DATE OF DICTATION:   12/29/2017

## 2017-12-29 NOTE — Progress Notes (Signed)
Site area: Scientific laboratory technician Prior to Removal:  Level 0 Pressure Applied For: 25 min Manual: yes   Patient Status During Pull:  A/O Post Pull Site:  Level 0 Post Pull Instructions Given:  Post instructions given and pt understands Post Pull Pulses Present: 2+ rt dp Dressing Applied:  Tegaderm and a 4x4 Bedrest begins @ 11:15:00 Comments: Pt leaves cath lab holding area in stable condition. Rt groin is CDI. No hematoma or complications.

## 2017-12-29 NOTE — Interval H&P Note (Signed)
History and Physical Interval Note:  12/29/2017 9:15 AM  Kenneth Lee  has presented today for surgery, with the diagnosis of pad  The various methods of treatment have been discussed with the patient and family. After consideration of risks, benefits and other options for treatment, the patient has consented to  Procedure(s): ABDOMINAL AORTOGRAM (N/A) as a surgical intervention .  The patient's history has been reviewed, patient examined, no change in status, stable for surgery.  I have reviewed the patient's chart and labs.  Questions were answered to the patient's satisfaction.     [name]

## 2018-01-01 ENCOUNTER — Telehealth: Payer: Self-pay | Admitting: Vascular Surgery

## 2018-01-01 ENCOUNTER — Encounter (HOSPITAL_COMMUNITY): Payer: Self-pay | Admitting: Vascular Surgery

## 2018-01-01 NOTE — Telephone Encounter (Signed)
sch appt spk to pt 01/17/18 12pm Vein Mapping 1230pm f/u MD

## 2018-01-02 ENCOUNTER — Other Ambulatory Visit: Payer: Self-pay

## 2018-01-02 DIAGNOSIS — Z01812 Encounter for preprocedural laboratory examination: Secondary | ICD-10-CM

## 2018-01-02 DIAGNOSIS — I70219 Atherosclerosis of native arteries of extremities with intermittent claudication, unspecified extremity: Secondary | ICD-10-CM

## 2018-01-03 DIAGNOSIS — H906 Mixed conductive and sensorineural hearing loss, bilateral: Secondary | ICD-10-CM | POA: Diagnosis not present

## 2018-01-03 DIAGNOSIS — H6522 Chronic serous otitis media, left ear: Secondary | ICD-10-CM | POA: Diagnosis not present

## 2018-01-04 ENCOUNTER — Encounter: Payer: Self-pay | Admitting: Vascular Surgery

## 2018-01-17 ENCOUNTER — Ambulatory Visit (INDEPENDENT_AMBULATORY_CARE_PROVIDER_SITE_OTHER): Payer: Medicare Other | Admitting: Vascular Surgery

## 2018-01-17 ENCOUNTER — Encounter: Payer: Self-pay | Admitting: Vascular Surgery

## 2018-01-17 ENCOUNTER — Ambulatory Visit (HOSPITAL_COMMUNITY)
Admission: RE | Admit: 2018-01-17 | Discharge: 2018-01-17 | Disposition: A | Discharge status: Home / Self Care | Payer: Medicare Other | Source: Ambulatory Visit | Attending: Vascular Surgery | Admitting: Vascular Surgery

## 2018-01-17 VITALS — BP 118/64 | HR 47 | Temp 97.3°F | Resp 18 | Ht 71.0 in | Wt 265.0 lb

## 2018-01-17 DIAGNOSIS — Z01810 Encounter for preprocedural cardiovascular examination: Secondary | ICD-10-CM | POA: Insufficient documentation

## 2018-01-17 DIAGNOSIS — I70219 Atherosclerosis of native arteries of extremities with intermittent claudication, unspecified extremity: Secondary | ICD-10-CM

## 2018-01-17 DIAGNOSIS — E119 Type 2 diabetes mellitus without complications: Secondary | ICD-10-CM | POA: Insufficient documentation

## 2018-01-17 DIAGNOSIS — Z01812 Encounter for preprocedural laboratory examination: Secondary | ICD-10-CM

## 2018-01-17 DIAGNOSIS — I10 Essential (primary) hypertension: Secondary | ICD-10-CM | POA: Diagnosis not present

## 2018-01-17 DIAGNOSIS — E785 Hyperlipidemia, unspecified: Secondary | ICD-10-CM | POA: Insufficient documentation

## 2018-01-17 DIAGNOSIS — I2510 Atherosclerotic heart disease of native coronary artery without angina pectoris: Secondary | ICD-10-CM | POA: Insufficient documentation

## 2018-01-17 NOTE — Progress Notes (Signed)
Patient name: Kenneth Lee MRN: [id number] DOB: 25-Sep-1945 Sex: male  REASON FOR VISIT:   To discuss arteriography.  HPI:   Kenneth Lee is a pleasant 72 y.o. male who I had seen with claudication of the left lower extremity.  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  His ABI on the left was 52% and 83% on the right.  He had disease in the proximal superficial femoral artery and distal common femoral artery and I was concerned that he might be at risk for graft thrombosis of the left limb of his graft.  He presented for arteriography.  He underwent an arteriogram on 12/29/2017.  This showed significant plaque in the distal common femoral artery just below the anastomosis which extends onto the deep femoral artery and partially onto the superficial femoral artery.   He continues to have left lower extremity claudication.  He denies any rest pain.  He denies any symptoms significant symptoms on the right side.  Past Medical History:  Diagnosis Date  . Arthritis   . Carotid disease, bilateral ([place])   . Chronic back pain    stenosis  . Coronary artery disease    with bare mental stent 2008 and RCA is Card   Dr.Smith  . Diabetes mellitus without complication ([place])    takes Metformin daily  . Headache    occasionally  . History of colon polyps    benign  . Hyperlipidemia    takes Crestor daily  . Hypertension    takes Amlodipine,Bystolic,Apresoline,and Lisinopril daily.   . Joint pain   . Other specified cardiac dysrhythmias(427.89)   . Peripheral edema    takes Lasix daily  . Peripheral neuropathy   . Sinoatrial node dysfunction (HCC)   . Sleep apnea   . Weakness    numbness and tingling,left leg    Family History  Problem Relation Age of Onset  . Heart disease Mother   . Cancer Mother   . Heart attack Mother   . Hypertension Mother   . Hypertension Father   . Hypertension Other   . Hyperlipidemia Other   .  Stroke Neg Hx     SOCIAL HISTORY: Social History   Tobacco Use  . Smoking status: Former Research scientist (life sciences)  . Smokeless tobacco: Never Used  . Tobacco comment: history of smoking-quit a yr ago  Substance Use Topics  . Alcohol use: Yes    Comment: socially    Allergies  Allergen Reactions  . Penicillins Swelling    Has patient had a PCN reaction causing immediate rash, facial/tongue/throat swelling, SOB or lightheadedness with hypotension: Yes Has patient had a PCN reaction causing severe rash involving mucus membranes or skin necrosis: No Has patient had a PCN reaction that required hospitalization No Has patient had a PCN reaction occurring within the last 10 years: No If all of the above answers are "NO", then may proceed with Cephalosporin use.    Current Outpatient Medications  Medication Sig Dispense Refill  . amLODipine (NORVASC) 5 MG tablet TAKE 1 TABLET BY MOUTH EVERY DAY 30 tablet 11  . aspirin EC 81 MG tablet Take 1 tablet (81 mg total) by mouth daily. (Patient taking differently: Take 325 mg by mouth daily. )    . fluticasone (FLONASE) 50 MCG/ACT nasal spray Place 2 sprays into both nostrils at bedtime.   5  . furosemide (LASIX) 40 MG tablet Take 1 tablet (40 mg total) by mouth daily.  Please make overdue yearly appt with Dr. Tamala Julian before anymore refills. 1st attempt 90 tablet 2  . hydrALAZINE (APRESOLINE) 50 MG tablet Take 1 tablet (50 mg total) by mouth 3 (three) times daily. 270 tablet 3  . lisinopril (PRINIVIL,ZESTRIL) 20 MG tablet TAKE 1 TABLET BY MOUTH 2 TIMES DAILY. PLEASE KEEP YOUR UPCOMING APPT FOR FUTURE REFILLS. 180 tablet 0  . meloxicam (MOBIC) 15 MG tablet Take 15 mg by mouth daily.  7  . metFORMIN (GLUCOPHAGE) 500 MG tablet Take 500 mg by mouth 2 (two) times daily with a meal.     . Multiple Vitamin (MULTIVITAMIN WITH MINERALS) TABS tablet Take 1 tablet by mouth daily.    . Naphazoline HCl (CLEAR EYES OP) Place 1 drop into both eyes daily.    . nebivolol (BYSTOLIC) 5  MG tablet Take 1 tablet (5 mg total) by mouth daily. Please keep 5/2 upcoming appointment for additional refills thanks. 30 tablet 1  . nebivolol (BYSTOLIC) 5 MG tablet Take 1 tablet (5 mg total) by mouth daily. 30 tablet 11  . nitroGLYCERIN (NITROSTAT) 0.4 MG SL tablet Place 0.4 mg under the tongue every 5 (five) minutes as needed for chest pain.    . Omega-3 Fatty Acids (FISH OIL) 1000 MG CAPS Take 1,000 mg by mouth daily.     . rosuvastatin (CRESTOR) 10 MG tablet Take 1 tablet (10 mg total) by mouth daily. 90 tablet 3  . spironolactone (ALDACTONE) 25 MG tablet Take 1 tablet (25 mg total) by mouth daily. 90 tablet 4   No current facility-administered medications for this visit.     REVIEW OF SYSTEMS:  [X]  denotes positive finding, [ ]  denotes negative finding Cardiac  Comments:  Chest pain or chest pressure:    Shortness of breath upon exertion: x   Short of breath when lying flat:    Irregular heart rhythm:        Vascular    Pain in calf, thigh, or hip brought on by ambulation:    Pain in feet at night that wakes you up from your sleep:     Blood clot in your veins:    Leg swelling:         Pulmonary    Oxygen at home:    Productive cough:     Wheezing:         Neurologic    Sudden weakness in arms or legs:     Sudden numbness in arms or legs:     Sudden onset of difficulty speaking or slurred speech:    Temporary loss of vision in one eye:     Problems with dizziness:         Gastrointestinal    Blood in stool:     Vomited blood:         Genitourinary    Burning when urinating:     Blood in urine:        Psychiatric    Major depression:         Hematologic    Bleeding problems:    Problems with blood clotting too easily:        Skin    Rashes or ulcers:        Constitutional    Fever or chills:     PHYSICAL EXAM:   Vitals:   01/17/18 1248  BP: 118/64  Pulse: (!) 47  Resp: 18  Temp: (!) 97.3 F (36.3 C)  TempSrc: Oral  SpO2: 95%  Weight: 265  lb  (120.2 kg)  Height: 5\' 11"  (1.803 m)    GENERAL: The patient is a well-nourished male, in no acute distress. The vital signs are documented above. CARDIAC: There is a regular rate and rhythm.  VASCULAR: He has palpable femoral pulses bilaterally.  On the right side he has a palpable dorsalis pedis pulse.  I cannot palpate pedal pulses on the left. PULMONARY: There is good air exchange bilaterally without wheezing or rales. ABDOMEN: Soft and non-tender with normal pitched bowel sounds.  MUSCULOSKELETAL: There are no major deformities or cyanosis. NEUROLOGIC: No focal weakness or paresthesias are detected. SKIN: There are no ulcers or rashes noted. PSYCHIATRIC: The patient has a normal affect.  DATA:    ARTERIOGRAPHY: His arteriogram shows a stenosis at the distal aspect of the left limb of his aortofemoral bypass graft.  This extends into the proximal superficial femoral artery and deep femoral artery.  Downstream from that he has a short segment occlusion of the superficial femoral artery with reconstitution of the above-knee popliteal artery.  VEIN MAP: I have independently interpreted his vein map of the left great saphenous vein.  The vein looks reasonable to the mid thigh and then becomes quite small.  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  I am afraid that with progression of this this could result in thrombosis of the left limb of his graft.  For this reason I recommended endarterectomy and patch angioplasty here to try to improve the chances of graft patency on the left.  He does have cardiac history and has had previous PTCA.  We will have him follow-up with Dr. Tamala Julian prior to scheduling his surgery.  Once he has been cleared from a cardiac standpoint we can schedule endarterectomy of the distal left common femoral artery with  vein patch angioplasty and revision of the distal  left limb of his aortofemoral bypass graft.  [name] [facility] [phone]

## 2018-01-17 NOTE — H&P (View-Only) (Signed)
Patient name: Kenneth Lee MRN: [id number] DOB: 1946/04/17 Sex: male  REASON FOR VISIT:   To discuss arteriography.  HPI:   Kenneth Lee is a pleasant 72 y.o. male who I had seen with claudication of the left lower extremity.  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  His ABI on the left was 52% and 83% on the right.  He had disease in the proximal superficial femoral artery and distal common femoral artery and I was concerned that he might be at risk for graft thrombosis of the left limb of his graft.  He presented for arteriography.  He underwent an arteriogram on 12/29/2017.  This showed significant plaque in the distal common femoral artery just below the anastomosis which extends onto the deep femoral artery and partially onto the superficial femoral artery.   He continues to have left lower extremity claudication.  He denies any rest pain.  He denies any symptoms significant symptoms on the right side.  Past Medical History:  Diagnosis Date  . Arthritis   . Carotid disease, bilateral ([place])   . Chronic back pain    stenosis  . Coronary artery disease    with bare mental stent 2008 and RCA is Card   Dr.Smith  . Diabetes mellitus without complication ([place])    takes Metformin daily  . Headache    occasionally  . History of colon polyps    benign  . Hyperlipidemia    takes Crestor daily  . Hypertension    takes Amlodipine,Bystolic,Apresoline,and Lisinopril daily.   . Joint pain   . Other specified cardiac dysrhythmias(427.89)   . Peripheral edema    takes Lasix daily  . Peripheral neuropathy   . Sinoatrial node dysfunction (HCC)   . Sleep apnea   . Weakness    numbness and tingling,left leg    Family History  Problem Relation Age of Onset  . Heart disease Mother   . Cancer Mother   . Heart attack Mother   . Hypertension Mother   . Hypertension Father   . Hypertension Other   . Hyperlipidemia Other   .  Stroke Neg Hx     SOCIAL HISTORY: Social History   Tobacco Use  . Smoking status: Former Research scientist (life sciences)  . Smokeless tobacco: Never Used  . Tobacco comment: history of smoking-quit a yr ago  Substance Use Topics  . Alcohol use: Yes    Comment: socially    Allergies  Allergen Reactions  . Penicillins Swelling    Has patient had a PCN reaction causing immediate rash, facial/tongue/throat swelling, SOB or lightheadedness with hypotension: Yes Has patient had a PCN reaction causing severe rash involving mucus membranes or skin necrosis: No Has patient had a PCN reaction that required hospitalization No Has patient had a PCN reaction occurring within the last 10 years: No If all of the above answers are "NO", then may proceed with Cephalosporin use.    Current Outpatient Medications  Medication Sig Dispense Refill  . amLODipine (NORVASC) 5 MG tablet TAKE 1 TABLET BY MOUTH EVERY DAY 30 tablet 11  . aspirin EC 81 MG tablet Take 1 tablet (81 mg total) by mouth daily. (Patient taking differently: Take 325 mg by mouth daily. )    . fluticasone (FLONASE) 50 MCG/ACT nasal spray Place 2 sprays into both nostrils at bedtime.   5  . furosemide (LASIX) 40 MG tablet Take 1 tablet (40 mg total) by mouth daily.  Please make overdue yearly appt with Dr. Tamala Julian before anymore refills. 1st attempt 90 tablet 2  . hydrALAZINE (APRESOLINE) 50 MG tablet Take 1 tablet (50 mg total) by mouth 3 (three) times daily. 270 tablet 3  . lisinopril (PRINIVIL,ZESTRIL) 20 MG tablet TAKE 1 TABLET BY MOUTH 2 TIMES DAILY. PLEASE KEEP YOUR UPCOMING APPT FOR FUTURE REFILLS. 180 tablet 0  . meloxicam (MOBIC) 15 MG tablet Take 15 mg by mouth daily.  7  . metFORMIN (GLUCOPHAGE) 500 MG tablet Take 500 mg by mouth 2 (two) times daily with a meal.     . Multiple Vitamin (MULTIVITAMIN WITH MINERALS) TABS tablet Take 1 tablet by mouth daily.    . Naphazoline HCl (CLEAR EYES OP) Place 1 drop into both eyes daily.    . nebivolol (BYSTOLIC) 5  MG tablet Take 1 tablet (5 mg total) by mouth daily. Please keep 5/2 upcoming appointment for additional refills thanks. 30 tablet 1  . nebivolol (BYSTOLIC) 5 MG tablet Take 1 tablet (5 mg total) by mouth daily. 30 tablet 11  . nitroGLYCERIN (NITROSTAT) 0.4 MG SL tablet Place 0.4 mg under the tongue every 5 (five) minutes as needed for chest pain.    . Omega-3 Fatty Acids (FISH OIL) 1000 MG CAPS Take 1,000 mg by mouth daily.     . rosuvastatin (CRESTOR) 10 MG tablet Take 1 tablet (10 mg total) by mouth daily. 90 tablet 3  . spironolactone (ALDACTONE) 25 MG tablet Take 1 tablet (25 mg total) by mouth daily. 90 tablet 4   No current facility-administered medications for this visit.     REVIEW OF SYSTEMS:  [X]  denotes positive finding, [ ]  denotes negative finding Cardiac  Comments:  Chest pain or chest pressure:    Shortness of breath upon exertion: x   Short of breath when lying flat:    Irregular heart rhythm:        Vascular    Pain in calf, thigh, or hip brought on by ambulation:    Pain in feet at night that wakes you up from your sleep:     Blood clot in your veins:    Leg swelling:         Pulmonary    Oxygen at home:    Productive cough:     Wheezing:         Neurologic    Sudden weakness in arms or legs:     Sudden numbness in arms or legs:     Sudden onset of difficulty speaking or slurred speech:    Temporary loss of vision in one eye:     Problems with dizziness:         Gastrointestinal    Blood in stool:     Vomited blood:         Genitourinary    Burning when urinating:     Blood in urine:        Psychiatric    Major depression:         Hematologic    Bleeding problems:    Problems with blood clotting too easily:        Skin    Rashes or ulcers:        Constitutional    Fever or chills:     PHYSICAL EXAM:   Vitals:   01/17/18 1248  BP: 118/64  Pulse: (!) 47  Resp: 18  Temp: (!) 97.3 F (36.3 C)  TempSrc: Oral  SpO2: 95%  Weight: 265  lb  (120.2 kg)  Height: 5\' 11"  (1.803 m)    GENERAL: The patient is a well-nourished male, in no acute distress. The vital signs are documented above. CARDIAC: There is a regular rate and rhythm.  VASCULAR: He has palpable femoral pulses bilaterally.  On the right side he has a palpable dorsalis pedis pulse.  I cannot palpate pedal pulses on the left. PULMONARY: There is good air exchange bilaterally without wheezing or rales. ABDOMEN: Soft and non-tender with normal pitched bowel sounds.  MUSCULOSKELETAL: There are no major deformities or cyanosis. NEUROLOGIC: No focal weakness or paresthesias are detected. SKIN: There are no ulcers or rashes noted. PSYCHIATRIC: The patient has a normal affect.  DATA:    ARTERIOGRAPHY: His arteriogram shows a stenosis at the distal aspect of the left limb of his aortofemoral bypass graft.  This extends into the proximal superficial femoral artery and deep femoral artery.  Downstream from that he has a short segment occlusion of the superficial femoral artery with reconstitution of the above-knee popliteal artery.  VEIN MAP: I have independently interpreted his vein map of the left great saphenous vein.  The vein looks reasonable to the mid thigh and then becomes quite small.  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  I am afraid that with progression of this this could result in thrombosis of the left limb of his graft.  For this reason I recommended endarterectomy and patch angioplasty here to try to improve the chances of graft patency on the left.  He does have cardiac history and has had previous PTCA.  We will have him follow-up with Dr. Tamala Julian prior to scheduling his surgery.  Once he has been cleared from a cardiac standpoint we can schedule endarterectomy of the distal left common femoral artery with  vein patch angioplasty and revision of the distal  left limb of his aortofemoral bypass graft.  [name] [facility] [phone]

## 2018-01-18 DIAGNOSIS — H6992 Unspecified Eustachian tube disorder, left ear: Secondary | ICD-10-CM | POA: Diagnosis not present

## 2018-01-18 DIAGNOSIS — H6122 Impacted cerumen, left ear: Secondary | ICD-10-CM | POA: Diagnosis not present

## 2018-01-18 NOTE — Progress Notes (Signed)
Needs Lexiscan nuclear to evaluate for Pre-Op clearance before vascular surgery.

## 2018-01-19 ENCOUNTER — Telehealth: Payer: Self-pay | Admitting: *Deleted

## 2018-01-19 NOTE — Telephone Encounter (Signed)
Editor: Belva Crome, MD (Physician)      Added by: [] Belva Crome, MD   Needs Lexiscan nuclear to evaluate for Pre-Op clearance before vascular surgery.       Spoke with pt and advised him of Dr. Thompson Caul recommendation.  Pt verbalized understanding.  Pt mentioned that he has appt on 7/15 with Truitt Merle, NP for pre op clearance.  Advised pt we will plan to order Lexi and schedule while he is here on Monday.  Pt appreciative for call.

## 2018-01-19 NOTE — Progress Notes (Signed)
Kenneth Lee, I will set him up for stress nuclear to r/o high risk substrate prior to PV surgery.  [name]

## 2018-01-19 NOTE — Telephone Encounter (Signed)
-----   Message from Belva Crome, MD sent at 01/18/2018  9:53 PM EDT -----   ----- Message ----- From: Angelia Mould, MD Sent: 01/17/2018   1:38 PM To: Belva Crome, MD

## 2018-01-22 ENCOUNTER — Telehealth (HOSPITAL_COMMUNITY): Payer: Self-pay | Admitting: *Deleted

## 2018-01-22 ENCOUNTER — Ambulatory Visit (INDEPENDENT_AMBULATORY_CARE_PROVIDER_SITE_OTHER): Payer: Medicare Other | Admitting: Nurse Practitioner

## 2018-01-22 ENCOUNTER — Encounter: Payer: Self-pay | Admitting: Nurse Practitioner

## 2018-01-22 VITALS — BP 128/60 | HR 49 | Ht 71.0 in | Wt 267.1 lb

## 2018-01-22 DIAGNOSIS — I70219 Atherosclerosis of native arteries of extremities with intermittent claudication, unspecified extremity: Secondary | ICD-10-CM | POA: Diagnosis not present

## 2018-01-22 DIAGNOSIS — Z01810 Encounter for preprocedural cardiovascular examination: Secondary | ICD-10-CM

## 2018-01-22 NOTE — Progress Notes (Signed)
CARDIOLOGY OFFICE NOTE  Date:  01/22/2018    Kenneth Lee Date of Birth: 01-Jun-1946 Medical Record #[id number]  PCP:  Seward Carol, MD  Cardiologist:  Tamala Julian  Chief Complaint  Patient presents with  . Pre-op Exam    Seen for Dr. Tamala Julian    History of Present Illness: Kenneth Lee is a 72 y.o. male who presents today for a work in visit - pre op clearance. Seen for Dr. Tamala Julian.   He has known CAD with prior BMS to the RCA in 2008, bilateral carotid disease, SSS, HTN, HLD and OSA. He has obesity and is borderline diabetic - but on Metformin. 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 He was then lost to follow-up.   Seen here back in May - concern for possible claudication with bilateral hip/thigh discomfort. He ended up getting back to Dr. [name] -  found to have monophasic signal starting at the common femoral level with an ABI 52% on the left. ABI on the right was 83%. He appeared to have disease in the proximal SFA and distal common femoral artery and there was concern that he might be at risk for graft thrombosis of the left limb of his graft. He underwent an arteriogram on 12/29/2017.  This showed significant plaque in the distal common femoral artery just below the anastomosis which extends onto the deep femoral artery and partially onto the superficial femoral artery.He has had recommended endarterectomy and patch angioplasty to try to improve the chances of graft patency on the left.  He is to undergo endarterectomy of the distal left common femoral artery with vein patch angioplasty and revision of the distal  left limb of his aortofemoral bypass graft. This has not been scheduled.   His Functional Capacity in METs is: 4.3 according to the [employer] Activity Status Index (DASI). Probably even less - he tells me he cannot walk around the block.   Comes in today. Here alone. He notes he wanted to be proactive  before proceeding with his vascular surgery. He has no chest pain/arm pain - he did have this with his remote PCI. He is very sedentary due to hip/thigh/back pain. Remains obese. No real shortness of breath with exertion - but he does not exert. Tolerating his medicines. He hopes to be able to be more active in the future. He has not had stress testing in many years.   Past Medical History:  Diagnosis Date  . Arthritis   . Carotid disease, bilateral ([place])   . Chronic back pain    stenosis  . Coronary artery disease    with bare mental stent 2008 and RCA is Card   Dr.Smith  . Diabetes mellitus without complication ([place])    takes Metformin daily  . Headache    occasionally  . History of colon polyps    benign  . Hyperlipidemia    takes Crestor daily  . Hypertension    takes Amlodipine,Bystolic,Apresoline,and Lisinopril daily.   . Joint pain   . Other specified cardiac dysrhythmias(427.89)   . Peripheral edema    takes Lasix daily  . Peripheral neuropathy   . Sinoatrial node dysfunction ([facility])   . Sleep apnea   . Weakness    numbness and tingling,left leg    Past Surgical History:  Procedure Laterality Date  . ABDOMINAL AORTOGRAM N/A 12/29/2017   Procedure: ABDOMINAL AORTOGRAM;  Surgeon: Angelia Mould, MD;  Location: [facility];  Service: Cardiovascular;  Laterality: N/A;  . aortobifemoral bypass  1996  . BACK SURGERY  2016   fusion  . CARDIAC CATHETERIZATION  2008  . CHOLECYSTECTOMY  2001  . COLONOSCOPY    . CORONARY ANGIOPLASTY     1 stent  . discectomy  20 yrs ago  . ESOPHAGOGASTRODUODENOSCOPY    . LOWER EXTREMITY ANGIOGRAPHY Bilateral 12/29/2017   Procedure: Lower Extremity Angiography;  Surgeon: Angelia Mould, MD;  Location: [facility];  Service: Cardiovascular;  Laterality: Bilateral;     Medications: Current Meds  Medication Sig  . amLODipine (NORVASC) 5 MG tablet TAKE 1 TABLET BY MOUTH EVERY DAY  . aspirin 325 MG tablet Take  325 mg by mouth daily.  . fluticasone (FLONASE) 50 MCG/ACT nasal spray Place 2 sprays into both nostrils at bedtime.   . furosemide (LASIX) 40 MG tablet Take 40 mg by mouth daily.  . hydrALAZINE (APRESOLINE) 50 MG tablet Take 1 tablet (50 mg total) by mouth 3 (three) times daily.  Marland Kitchen lisinopril (PRINIVIL,ZESTRIL) 20 MG tablet TAKE 1 TABLET BY MOUTH 2 TIMES DAILY. PLEASE KEEP YOUR UPCOMING APPT FOR FUTURE REFILLS.  Marland Kitchen meloxicam (MOBIC) 15 MG tablet Take 15 mg by mouth daily.  . metFORMIN (GLUCOPHAGE) 500 MG tablet Take 500 mg by mouth 2 (two) times daily with a meal.   . Multiple Vitamin (MULTIVITAMIN WITH MINERALS) TABS tablet Take 1 tablet by mouth daily.  . Naphazoline HCl (CLEAR EYES OP) Place 1 drop into both eyes daily.  . nebivolol (BYSTOLIC) 5 MG tablet Take 1 tablet (5 mg total) by mouth daily.  . nitroGLYCERIN (NITROSTAT) 0.4 MG SL tablet Place 0.4 mg under the tongue every 5 (five) minutes as needed for chest pain.  . Omega-3 Fatty Acids (FISH OIL) 1000 MG CAPS Take 1,000 mg by mouth daily.   . rosuvastatin (CRESTOR) 10 MG tablet Take 1 tablet (10 mg total) by mouth daily.  Marland Kitchen spironolactone (ALDACTONE) 25 MG tablet Take 1 tablet (25 mg total) by mouth daily.     Allergies: Allergies  Allergen Reactions  . Penicillins Swelling    Has patient had a PCN reaction causing immediate rash, facial/tongue/throat swelling, SOB or lightheadedness with hypotension: Yes Has patient had a PCN reaction causing severe rash involving mucus membranes or skin necrosis: No Has patient had a PCN reaction that required hospitalization No Has patient had a PCN reaction occurring within the last 10 years: No If all of the above answers are "NO", then may proceed with Cephalosporin use.    Social History: The patient  reports that he has quit smoking. He has never used smokeless tobacco. He reports that he drinks alcohol. He reports that he does not use drugs.   Family History: The patient's family  history includes Cancer in his mother; Heart attack in his mother; Heart disease in his mother; Hyperlipidemia in his other; Hypertension in his father, mother, and other.   Review of Systems: Please see the history of present illness.   Otherwise, the review of systems is positive for none.   All other systems are reviewed and negative.   Physical Exam: VS:  BP 128/60 (BP Location: Left Arm, Patient Position: Sitting, Cuff Size: Large)   Pulse (!) 49   Ht 5\' 11"  (1.803 m)   Wt 267 lb 1.9 oz (121.2 kg)   BMI 37.26 kg/m  .  BMI Body mass index is 37.26 kg/m.  Wt Readings from Last  3 Encounters:  01/22/18 267 lb 1.9 oz (121.2 kg)  01/17/18 265 lb (120.2 kg)  12/29/17 265 lb (120.2 kg)    General: Pleasant. He is obese. Alert and in no acute distress.   HEENT: Normal.  Neck: Supple, no JVD, carotid bruits, or masses noted.  Cardiac: Regular rate and rhythm. No murmurs, rubs, or gallops. No edema.  Respiratory:  Lungs are clear to auscultation bilaterally with normal work of breathing.  GI: Soft and nontender.  MS: No deformity or atrophy. Gait and ROM intact.  Skin: Warm and dry. Color is normal.  Neuro:  Strength and sensation are intact and no gross focal deficits noted.  Psych: Alert, appropriate and with normal affect.   LABORATORY DATA:  EKG:  EKG is ordered today. This demonstrates sinus bradycardia.  Lab Results  Component Value Date   WBC 10.5 01/11/2016   HGB 13.3 12/29/2017   HCT 39.0 12/29/2017   PLT 231 01/11/2016   GLUCOSE 158 (H) 12/29/2017   CHOL  03/29/2007    194        ATP III CLASSIFICATION:  <200     mg/dL   Desirable  200-239  mg/dL   Borderline High  >=240    mg/dL   High   TRIG 140 03/29/2007   HDL 39 (L) 03/29/2007   LDLCALC (H) 03/29/2007    127        Total Cholesterol/HDL:CHD Risk Coronary Heart Disease Risk Table                     Men   Women  1/2 Average Risk   3.4   3.3   ALT 25 05/09/2015   AST 29 05/09/2015   NA 141 12/29/2017     K 4.3 12/29/2017   CL 108 12/29/2017   CREATININE 1.00 12/29/2017   BUN 32 (H) 12/29/2017   CO2 22 02/26/2016   TSH 2.066 05/08/2015   INR 1.17 05/08/2015   HGBA1C 6.2 (H) 12/30/2015       BNP (last 3 results) No results for input(s): BNP in the last 8760 hours.  ProBNP (last 3 results) No results for input(s): PROBNP in the last 8760 hours.   Other Studies Reviewed Today:  Echo Study Conclusions 2016 - Left ventricle: The cavity size was normal. Systolic function was   normal. The estimated ejection fraction was in the range of 55%   to 60%. Wall motion was normal; there were no regional wall   motion abnormalities. Features are consistent with a pseudonormal   left ventricular filling pattern, with concomitant abnormal   relaxation and increased filling pressure (grade 2 diastolic   dysfunction). Doppler parameters are consistent with high   ventricular filling pressure. - Aortic valve: Moderate focal calcification involving the   noncoronary cusp. - Mitral valve: There was trivial regurgitation. - Atrial septum: There was increased thickness of the septum,   consistent with lipomatous hypertrophy. Echo contrast study   showed no right-to-left atrial level shunt, at baseline or with   provocation.   Assessment/Plan:  1. CAD - remote BMS in 2008 - no active symptoms. Very poor activity level. Needs aggressive CV risk factor modification.   2. Pre op clearance for vascular surgery - will need lexiscan to clear. Further disposition to follow. He has no active symptoms.   3. HTN - BP ok on current regimen. No changes made today.   4. PAD - seeing Dr. [name] - upcoming surgery as noted above.  5. HLD - on statin therapy - labs from May noted.   6. DM - on Metformin  Current medicines are reviewed with the patient today.  The patient does not have concerns regarding medicines other than what has been noted above.  The following changes have been made:  See  above.  Labs/ tests ordered today include:    Orders Placed This Encounter  Procedures  . MYOCARDIAL PERFUSION IMAGING  . EKG 12-Lead     Disposition:   FU with Dr. Tamala Julian as planned. Further disposition to follow.   Patient is agreeable to this plan and will call if any problems develop in the interim.   SignedTruitt Merle, NP  01/22/2018 9:50 AM  [facility] [address] [place], [place]  [address] Phone: [phone] Fax: [phone]

## 2018-01-22 NOTE — Telephone Encounter (Signed)
Patient given detailed instructions per Myocardial Perfusion Study Information Sheet for the test on 01/24/18. Patient notified to arrive 15 minutes early and that it is imperative to arrive on time for appointment to keep from having the test rescheduled.  If you need to cancel or reschedule your appointment, please call the office within 24 hours of your appointment. . Patient verbalized understanding. Kirstie Peri

## 2018-01-22 NOTE — Patient Instructions (Addendum)
We will be checking the following labs today - NONE   Medication Instructions:    Continue with your current medicines.     Testing/Procedures To Be Arranged:  Lexiscan Myoview  You are scheduled for a Myocardial Perfusion Imaging Study on ____________________________________ at ________________________________________-.   Please arrive 15 minutes prior to your appointment time for registration and insurance purposes.   The test will take approximately 3 to 4 hours to complete; you may bring reading material. If someone comes with you to your appointment, they will need to remain in the main lobby due to limited space in the testing area.    How to prepare for your Myocardial Perfusion test:   Do not eat or drink 3 hours prior to your test, except you may have water.    Do not consume products containing caffeine (regular or decaffeinated) 12 hours prior to your test (ex: coffee, chocolate, soda, tea)   Do bring a list of your current medications with you. If not listed below, you may take your medications as normal.     Bring any held medication to your appointment, as you may be required to take it once the test is complete.   Do wear comfortable clothes (no dresses or overalls) and walking shoes. Tennis shoes are preferred. No heels or open toed shoes.  Do not wear cologne, perfume, aftershave or lotions (deodorant is allowed).   If these instructions are not followed, you test will have to be rescheduled.   Please report to [address] for your test. If you have questions or concerns about your appointment, please call the Nuclear Lab at [phone].  If you cannot keep your appointment, please provide 24 hour notification to the Nuclear lab to avoid a possible $50 charge to your account.       Follow-Up:   See Dr. Tamala Julian as planned    Other Special Instructions:   N/A    If you need a refill on your cardiac medications before your  next appointment, please call your pharmacy.   Call the [place] office at [phone] if you have any questions, problems or concerns.

## 2018-01-24 ENCOUNTER — Ambulatory Visit (HOSPITAL_COMMUNITY): Payer: Medicare Other | Attending: Cardiology

## 2018-01-24 VITALS — Ht 71.0 in | Wt 261.0 lb

## 2018-01-24 DIAGNOSIS — R9439 Abnormal result of other cardiovascular function study: Secondary | ICD-10-CM | POA: Insufficient documentation

## 2018-01-24 DIAGNOSIS — R110 Nausea: Secondary | ICD-10-CM

## 2018-01-24 DIAGNOSIS — Z01810 Encounter for preprocedural cardiovascular examination: Secondary | ICD-10-CM | POA: Insufficient documentation

## 2018-01-24 LAB — MYOCARDIAL PERFUSION IMAGING
LV dias vol: 159 mL (ref 62–150)
LV sys vol: 71 mL
Peak HR: 86 {beats}/min
RATE: 0.33
Rest HR: 48 {beats}/min
SDS: 0
SRS: 2
SSS: 2
TID: 1.03

## 2018-01-24 MED ORDER — REGADENOSON 0.4 MG/5ML IV SOLN
0.4000 mg | Freq: Once | INTRAVENOUS | Status: AC
  Administered 2018-01-24: 0.4 mg via INTRAVENOUS

## 2018-01-24 MED ORDER — TECHNETIUM TC 99M TETROFOSMIN IV KIT
10.3000 | PACK | Freq: Once | INTRAVENOUS | Status: AC | PRN
  Administered 2018-01-24: 10.3 via INTRAVENOUS
  Filled 2018-01-24: qty 11

## 2018-01-24 MED ORDER — AMINOPHYLLINE 25 MG/ML IV SOLN
75.0000 mg | Freq: Once | INTRAVENOUS | Status: AC
  Administered 2018-01-24: 75 mg via INTRAVENOUS

## 2018-01-24 MED ORDER — TECHNETIUM TC 99M TETROFOSMIN IV KIT
32.8000 | PACK | Freq: Once | INTRAVENOUS | Status: AC | PRN
  Administered 2018-01-24: 32.8 via INTRAVENOUS
  Filled 2018-01-24: qty 33

## 2018-01-26 ENCOUNTER — Other Ambulatory Visit: Payer: Self-pay | Admitting: *Deleted

## 2018-01-26 NOTE — Progress Notes (Signed)
Phone call to patient instructed to be at [facility] admitting department at 5:30 am on 02/01/18 for surgery. NPO past MN  Night prior and expect a call and follow the detailed instructions received from the hospital pre-admission department about this surgery. Verbalized understanding.

## 2018-01-29 DIAGNOSIS — H6992 Unspecified Eustachian tube disorder, left ear: Secondary | ICD-10-CM | POA: Diagnosis not present

## 2018-01-30 ENCOUNTER — Encounter (HOSPITAL_COMMUNITY): Payer: Self-pay

## 2018-01-30 DIAGNOSIS — H6992 Unspecified Eustachian tube disorder, left ear: Secondary | ICD-10-CM | POA: Diagnosis not present

## 2018-01-30 DIAGNOSIS — J018 Other acute sinusitis: Secondary | ICD-10-CM | POA: Diagnosis not present

## 2018-01-30 NOTE — Pre-Procedure Instructions (Signed)
Kenneth Lee  01/30/2018    Your procedure is scheduled on Thursday, February 01, 2018 at 7:30 AM.   Report to [facility] Entrance "A" Admitting Office at 5:30 AM.   Call this number if you have problems the morning of surgery: [phone]   Remember:  Do not eat or drink after midnight tonight.  Take these medicines the morning of surgery with A SIP OF WATER: Amlodipine (Norvasc), Aspirin, Hydralazine (Apresoline), Nebivolol (Bystolic), may use eye drops  Stop Multivitamins, Fish Oil and Meloxicam as of today.  Do not take Metformin in the morning.   How to Manage Your Diabetes Before Surgery   Why is it important to control my blood sugar before and after surgery?   Improving blood sugar levels before and after surgery helps healing and can limit problems.  A way of improving blood sugar control is eating a healthy diet by:  - Eating less sugar and carbohydrates  - Increasing activity/exercise  - Talk with your doctor about reaching your blood sugar goals  High blood sugars (greater than 180 mg/dL) can raise your risk of infections and slow down your recovery so you will need to focus on controlling your diabetes during the weeks before surgery.  Make sure that the doctor who takes care of your diabetes knows about your planned surgery including the date and location.  How do I manage my blood sugars before surgery?   Check your blood sugar the morning of your surgery when you wake up and every 2 hours until you get to the Short-Stay unit.  Treat a low blood sugar (less than 70 mg/dL) with 1/2 cup of clear juice (cranberry or apple), 4 glucose tablets, OR glucose gel.  Recheck blood sugar in 15 minutes after treatment (to make sure it is greater than 70 mg/dL).  If blood sugar is not greater than 70 mg/dL on re-check, call [phone] for further instructions.   Report your blood sugar to the Short-Stay nurse when you get to Short-Stay.  References:   [facility], 2007 "How to Manage your Diabetes Before and After Surgery".   Do not wear jewelry.  Do not wear lotions, powders, cologne or deodorant.  Do not shave 48 hours prior to surgery.  Men may shave face and neck.  Do not bring valuables to the hospital.  [facility] is not responsible for any belongings or valuables.  Contacts, dentures or bridgework may not be worn into surgery.  Leave your suitcase in the car.  After surgery it may be brought to your room.  For patients admitted to the hospital, discharge time will be determined by your treatment team.  [facility] - Preparing for Surgery  Before surgery, you can play an important role.  Because skin is not sterile, your skin needs to be as free of germs as possible.  You can reduce the number of germs on you skin by washing with CHG (chlorahexidine gluconate) soap before surgery.  CHG is an antiseptic cleaner which kills germs and bonds with the skin to continue killing germs even after washing.  Oral Hygiene is also important in reducing the risk of infection.  Remember to brush your teeth with your regular toothpaste the morning of surgery.  Please DO NOT use if you have an allergy to CHG or antibacterial soaps.  If your skin becomes reddened/irritated stop using the CHG and inform your nurse when you arrive at Short Stay.  Do not shave (including  legs and underarms) for at least 48 hours prior to the first CHG shower.  You may shave your face.  Please follow these instructions carefully:   1.  Shower with CHG Soap the night before surgery and the morning of Surgery.  2.  If you choose to wash your hair, wash your hair first as usual with your normal shampoo.  3.  After you shampoo, rinse your hair and body thoroughly to remove the shampoo. 4.  Use CHG as you would any other liquid soap.  You can apply chg directly to the skin and wash gently with a      scrungie or washcloth.           5.  Apply  the CHG Soap to your body ONLY FROM THE NECK DOWN.   Do not use on open wounds or open sores. Avoid contact with your eyes, ears, mouth and genitals (private parts).  Wash genitals (private parts) with your normal soap.  6.  Wash thoroughly, paying special attention to the area where your surgery will be performed.  7.  Thoroughly rinse your body with warm water from the neck down.  8.  DO NOT shower/wash with your normal soap after using and rinsing off the CHG Soap.  9.  Pat yourself dry with a clean towel.            10.  Wear clean pajamas.            11.  Place clean sheets on your bed the night of your first shower and do not sleep with pets.  Day of Surgery  Shower as above. Do not apply any lotions/deodorants the morning of surgery.   Please wear clean clothes to the hospital. Remember to brush your teeth with toothpaste.   Please read over the fact sheets that you were given.

## 2018-01-31 ENCOUNTER — Other Ambulatory Visit: Payer: Self-pay

## 2018-01-31 ENCOUNTER — Encounter (HOSPITAL_COMMUNITY)
Admission: RE | Admit: 2018-01-31 | Discharge: 2018-01-31 | Disposition: A | Discharge status: Home / Self Care | Payer: Medicare Other | Source: Ambulatory Visit | Attending: Vascular Surgery | Admitting: Vascular Surgery

## 2018-01-31 ENCOUNTER — Encounter (HOSPITAL_COMMUNITY): Payer: Self-pay

## 2018-01-31 DIAGNOSIS — Z01812 Encounter for preprocedural laboratory examination: Secondary | ICD-10-CM | POA: Diagnosis not present

## 2018-01-31 HISTORY — DX: Cardiac murmur, unspecified: R01.1

## 2018-01-31 LAB — URINALYSIS, ROUTINE W REFLEX MICROSCOPIC
Bilirubin Urine: NEGATIVE
Glucose, UA: NEGATIVE mg/dL
Hgb urine dipstick: NEGATIVE
Ketones, ur: NEGATIVE mg/dL
Leukocytes, UA: NEGATIVE
Nitrite: NEGATIVE
Protein, ur: NEGATIVE mg/dL
Specific Gravity, Urine: 1.005 (ref 1.005–1.030)
pH: 5 (ref 5.0–8.0)

## 2018-01-31 LAB — COMPREHENSIVE METABOLIC PANEL
ALT: 26 U/L (ref 0–44)
AST: 26 U/L (ref 15–41)
Albumin: 4.2 g/dL (ref 3.5–5.0)
Alkaline Phosphatase: 81 U/L (ref 38–126)
Anion gap: 10 (ref 5–15)
BUN: 29 mg/dL — ABNORMAL HIGH (ref 8–23)
CO2: 21 mmol/L — ABNORMAL LOW (ref 22–32)
Calcium: 9.6 mg/dL (ref 8.9–10.3)
Chloride: 109 mmol/L (ref 98–111)
Creatinine, Ser: 1.17 mg/dL (ref 0.61–1.24)
GFR calc Af Amer: >60 mL/min (ref >60)
GFR calc non Af Amer: >60 mL/min (ref >60)
Glucose, Bld: 157 mg/dL — ABNORMAL HIGH (ref 70–99)
Potassium: 4.8 mmol/L (ref 3.5–5.1)
Sodium: 140 mmol/L (ref 135–145)
Total Bilirubin: 0.7 mg/dL (ref 0.3–1.2)
Total Protein: 6.9 g/dL (ref 6.5–8.1)

## 2018-01-31 LAB — SURGICAL PCR SCREEN
MRSA, PCR: NEGATIVE
Staphylococcus aureus: NEGATIVE

## 2018-01-31 LAB — CBC
HCT: 44.2 % (ref 39.0–52.0)
Hemoglobin: 14.2 g/dL (ref 13.0–17.0)
MCH: 31.9 pg (ref 26.0–34.0)
MCHC: 32.1 g/dL (ref 30.0–36.0)
MCV: 99.3 fL (ref 78.0–100.0)
Platelets: 180 10*3/uL (ref 150–400)
RBC: 4.45 MIL/uL (ref 4.22–5.81)
RDW: 13.4 % (ref 11.5–15.5)
WBC: 10.9 10*3/uL — ABNORMAL HIGH (ref 4.0–10.5)

## 2018-01-31 LAB — HEMOGLOBIN A1C
Hgb A1c MFr Bld: 6.8 % — ABNORMAL HIGH (ref 4.8–5.6)
Mean Plasma Glucose: 148.46 mg/dL

## 2018-01-31 LAB — PROTIME-INR
INR: 1.06
Prothrombin Time: 13.7 seconds (ref 11.4–15.2)

## 2018-01-31 LAB — GLUCOSE, CAPILLARY: Glucose-Capillary: 167 mg/dL — ABNORMAL HIGH (ref 70–99)

## 2018-01-31 LAB — APTT: aPTT: 34 seconds (ref 24–36)

## 2018-01-31 MED ORDER — VANCOMYCIN HCL 10 G IV SOLR
1500.0000 mg | INTRAVENOUS | Status: AC
  Administered 2018-02-09: 1500 mg via INTRAVENOUS
  Filled 2018-01-31 (×2): qty 1500

## 2018-01-31 NOTE — Progress Notes (Signed)
Notified pt of date change for day of surgery. Instructed him to arrive at the same time on 02/09/18.

## 2018-01-31 NOTE — Progress Notes (Signed)
Pt has hx of CAD with 1 metal stent that was placed in 2008. Pt denies any recent chest pain or sob. Pt's cardiologist is Dr. Daneen Schick. Pt is a type 2 diabetic. States his last A1C was over a year ago. States he does not check his blood sugar. CBG today was 167 with coffee only. Pt states he was started on a Z-pack for an ear infection. Denies any fever, flu like symptoms or drainage from ear. Dr. Nicole Cella office notified, spoke with Caryl Pina.

## 2018-02-01 LAB — TYPE AND SCREEN
ABO/RH(D): A NEG
Antibody Screen: NEGATIVE

## 2018-02-06 ENCOUNTER — Other Ambulatory Visit: Payer: Self-pay

## 2018-02-06 MED ORDER — LISINOPRIL 20 MG PO TABS: ORAL_TABLET | ORAL | 0 refills | Status: DC

## 2018-02-08 ENCOUNTER — Encounter (HOSPITAL_COMMUNITY): Payer: Self-pay | Admitting: Certified Registered Nurse Anesthetist

## 2018-02-09 ENCOUNTER — Other Ambulatory Visit: Payer: Self-pay

## 2018-02-09 ENCOUNTER — Inpatient Hospital Stay (HOSPITAL_COMMUNITY): Payer: Medicare Other | Admitting: Certified Registered Nurse Anesthetist

## 2018-02-09 ENCOUNTER — Encounter (HOSPITAL_COMMUNITY)
Admission: RE | Disposition: A | Discharge status: Home / Self Care | Payer: Self-pay | Source: Ambulatory Visit | Attending: Vascular Surgery

## 2018-02-09 ENCOUNTER — Inpatient Hospital Stay (HOSPITAL_COMMUNITY)
Admission: RE | Admit: 2018-02-09 | Discharge: 2018-02-11 | DRG: 254 | Disposition: A | Discharge status: Home / Self Care | Payer: Medicare Other | Source: Ambulatory Visit | Attending: Vascular Surgery | Admitting: Vascular Surgery

## 2018-02-09 ENCOUNTER — Encounter (HOSPITAL_COMMUNITY): Payer: Self-pay

## 2018-02-09 DIAGNOSIS — G4730 Sleep apnea, unspecified: Secondary | ICD-10-CM | POA: Diagnosis not present

## 2018-02-09 DIAGNOSIS — Z7982 Long term (current) use of aspirin: Secondary | ICD-10-CM

## 2018-02-09 DIAGNOSIS — M549 Dorsalgia, unspecified: Secondary | ICD-10-CM | POA: Diagnosis present

## 2018-02-09 DIAGNOSIS — Z880 Allergy status to penicillin: Secondary | ICD-10-CM

## 2018-02-09 DIAGNOSIS — Z87891 Personal history of nicotine dependence: Secondary | ICD-10-CM

## 2018-02-09 DIAGNOSIS — E1142 Type 2 diabetes mellitus with diabetic polyneuropathy: Secondary | ICD-10-CM | POA: Diagnosis present

## 2018-02-09 DIAGNOSIS — I70302 Unspecified atherosclerosis of unspecified type of bypass graft(s) of the extremities, left leg: Secondary | ICD-10-CM | POA: Diagnosis not present

## 2018-02-09 DIAGNOSIS — I2510 Atherosclerotic heart disease of native coronary artery without angina pectoris: Secondary | ICD-10-CM | POA: Diagnosis present

## 2018-02-09 DIAGNOSIS — Z809 Family history of malignant neoplasm, unspecified: Secondary | ICD-10-CM

## 2018-02-09 DIAGNOSIS — M1990 Unspecified osteoarthritis, unspecified site: Secondary | ICD-10-CM | POA: Diagnosis present

## 2018-02-09 DIAGNOSIS — Z86010 Personal history of colonic polyps: Secondary | ICD-10-CM | POA: Diagnosis not present

## 2018-02-09 DIAGNOSIS — Z791 Long term (current) use of non-steroidal anti-inflammatories (NSAID): Secondary | ICD-10-CM | POA: Diagnosis not present

## 2018-02-09 DIAGNOSIS — G8929 Other chronic pain: Secondary | ICD-10-CM | POA: Diagnosis present

## 2018-02-09 DIAGNOSIS — I495 Sick sinus syndrome: Secondary | ICD-10-CM | POA: Diagnosis not present

## 2018-02-09 DIAGNOSIS — I5033 Acute on chronic diastolic (congestive) heart failure: Secondary | ICD-10-CM | POA: Diagnosis not present

## 2018-02-09 DIAGNOSIS — I10 Essential (primary) hypertension: Secondary | ICD-10-CM | POA: Diagnosis present

## 2018-02-09 DIAGNOSIS — I70202 Unspecified atherosclerosis of native arteries of extremities, left leg: Secondary | ICD-10-CM | POA: Diagnosis not present

## 2018-02-09 DIAGNOSIS — Z7951 Long term (current) use of inhaled steroids: Secondary | ICD-10-CM | POA: Diagnosis not present

## 2018-02-09 DIAGNOSIS — E1151 Type 2 diabetes mellitus with diabetic peripheral angiopathy without gangrene: Secondary | ICD-10-CM | POA: Diagnosis not present

## 2018-02-09 DIAGNOSIS — I110 Hypertensive heart disease with heart failure: Secondary | ICD-10-CM | POA: Diagnosis not present

## 2018-02-09 DIAGNOSIS — I739 Peripheral vascular disease, unspecified: Secondary | ICD-10-CM | POA: Diagnosis present

## 2018-02-09 DIAGNOSIS — I70212 Atherosclerosis of native arteries of extremities with intermittent claudication, left leg: Principal | ICD-10-CM | POA: Diagnosis present

## 2018-02-09 DIAGNOSIS — Z8249 Family history of ischemic heart disease and other diseases of the circulatory system: Secondary | ICD-10-CM

## 2018-02-09 DIAGNOSIS — E785 Hyperlipidemia, unspecified: Secondary | ICD-10-CM | POA: Diagnosis not present

## 2018-02-09 HISTORY — PX: FEMORAL-POPLITEAL BYPASS GRAFT: SHX937

## 2018-02-09 LAB — CBC
HCT: 39.8 % (ref 39.0–52.0)
Hemoglobin: 12.8 g/dL — ABNORMAL LOW (ref 13.0–17.0)
MCH: 32.1 pg (ref 26.0–34.0)
MCHC: 32.2 g/dL (ref 30.0–36.0)
MCV: 99.7 fL (ref 78.0–100.0)
Platelets: 190 10*3/uL (ref 150–400)
RBC: 3.99 MIL/uL — ABNORMAL LOW (ref 4.22–5.81)
RDW: 13.2 % (ref 11.5–15.5)
WBC: 12.9 10*3/uL — ABNORMAL HIGH (ref 4.0–10.5)

## 2018-02-09 LAB — GLUCOSE, CAPILLARY
Glucose-Capillary: 151 mg/dL — ABNORMAL HIGH (ref 70–99)
Glucose-Capillary: 190 mg/dL — ABNORMAL HIGH (ref 70–99)
Glucose-Capillary: 204 mg/dL — ABNORMAL HIGH (ref 70–99)
Glucose-Capillary: 248 mg/dL — ABNORMAL HIGH (ref 70–99)

## 2018-02-09 LAB — CREATININE, SERUM
Creatinine, Ser: 1.05 mg/dL (ref 0.61–1.24)
GFR calc Af Amer: >60 mL/min (ref >60)
GFR calc non Af Amer: >60 mL/min (ref >60)

## 2018-02-09 SURGERY — BYPASS GRAFT FEMORAL-POPLITEAL ARTERY
Anesthesia: General | Site: Leg Upper | Laterality: Left

## 2018-02-09 MED ORDER — NITROGLYCERIN 0.4 MG SL SUBL: 0.4000 mg | SUBLINGUAL_TABLET | SUBLINGUAL | Status: DC | PRN

## 2018-02-09 MED ORDER — SODIUM CHLORIDE 0.9 % IV SOLN
INTRAVENOUS | Status: DC
  Administered 2018-02-09 – 2018-02-10 (×2): via INTRAVENOUS

## 2018-02-09 MED ORDER — INSULIN ASPART 100 UNIT/ML ~~LOC~~ SOLN
0.0000 [IU] | Freq: Three times a day (TID) | SUBCUTANEOUS | Status: DC
  Administered 2018-02-10: 3 [IU] via SUBCUTANEOUS
  Administered 2018-02-10: 2 [IU] via SUBCUTANEOUS
  Administered 2018-02-10: 3 [IU] via SUBCUTANEOUS
  Administered 2018-02-11: 2 [IU] via SUBCUTANEOUS

## 2018-02-09 MED ORDER — SODIUM CHLORIDE 0.9 % IV SOLN
INTRAVENOUS | Status: DC | PRN
  Administered 2018-02-09: 500 mL

## 2018-02-09 MED ORDER — SUGAMMADEX SODIUM 200 MG/2ML IV SOLN
INTRAVENOUS | Status: DC | PRN
  Administered 2018-02-09: 200 mg via INTRAVENOUS

## 2018-02-09 MED ORDER — SODIUM CHLORIDE 0.9 % IV SOLN
INTRAVENOUS | Status: AC
  Filled 2018-02-09: qty 1.2

## 2018-02-09 MED ORDER — FLUTICASONE PROPIONATE 50 MCG/ACT NA SUSP
2.0000 | Freq: Every day | NASAL | Status: DC
  Administered 2018-02-09 – 2018-02-10 (×2): 2 via NASAL
  Filled 2018-02-09: qty 16

## 2018-02-09 MED ORDER — HYDRALAZINE HCL 20 MG/ML IJ SOLN: 5.0000 mg | INTRAMUSCULAR | Status: DC | PRN

## 2018-02-09 MED ORDER — PANTOPRAZOLE SODIUM 40 MG PO TBEC
40.0000 mg | DELAYED_RELEASE_TABLET | Freq: Every day | ORAL | Status: DC
  Administered 2018-02-10 – 2018-02-11 (×2): 40 mg via ORAL
  Filled 2018-02-09 (×2): qty 1

## 2018-02-09 MED ORDER — HEPARIN SODIUM (PORCINE) 1000 UNIT/ML IJ SOLN
INTRAMUSCULAR | Status: DC | PRN
  Administered 2018-02-09: 11000 [IU] via INTRAVENOUS

## 2018-02-09 MED ORDER — PAPAVERINE HCL 30 MG/ML IJ SOLN
INTRAMUSCULAR | Status: AC
  Filled 2018-02-09: qty 2

## 2018-02-09 MED ORDER — PROTAMINE SULFATE 10 MG/ML IV SOLN
INTRAVENOUS | Status: DC | PRN
  Administered 2018-02-09: 50 mg via INTRAVENOUS

## 2018-02-09 MED ORDER — OXYCODONE-ACETAMINOPHEN 5-325 MG PO TABS
ORAL_TABLET | ORAL | Status: AC
  Filled 2018-02-09: qty 2

## 2018-02-09 MED ORDER — LABETALOL HCL 5 MG/ML IV SOLN: 10.0000 mg | INTRAVENOUS | Status: DC | PRN

## 2018-02-09 MED ORDER — SODIUM CHLORIDE 0.9 % IJ SOLN
INTRAMUSCULAR | Status: AC
  Filled 2018-02-09: qty 30

## 2018-02-09 MED ORDER — ONDANSETRON HCL 4 MG/2ML IJ SOLN: 4.0000 mg | Freq: Four times a day (QID) | INTRAMUSCULAR | Status: DC | PRN

## 2018-02-09 MED ORDER — PROPOFOL 10 MG/ML IV BOLUS
INTRAVENOUS | Status: AC
  Filled 2018-02-09: qty 40

## 2018-02-09 MED ORDER — LISINOPRIL 10 MG PO TABS
20.0000 mg | ORAL_TABLET | Freq: Two times a day (BID) | ORAL | Status: DC
  Administered 2018-02-09 – 2018-02-11 (×3): 20 mg via ORAL
  Filled 2018-02-09 (×5): qty 2

## 2018-02-09 MED ORDER — GUAIFENESIN-DM 100-10 MG/5ML PO SYRP: 15.0000 mL | ORAL_SOLUTION | ORAL | Status: DC | PRN

## 2018-02-09 MED ORDER — CHLORHEXIDINE GLUCONATE 4 % EX LIQD: 60.0000 mL | Freq: Once | CUTANEOUS | Status: DC

## 2018-02-09 MED ORDER — THROMBIN (RECOMBINANT) 5000 UNITS EX SOLR
CUTANEOUS | Status: AC
  Filled 2018-02-09: qty 20000

## 2018-02-09 MED ORDER — ASPIRIN 325 MG PO TABS
325.0000 mg | ORAL_TABLET | Freq: Every day | ORAL | Status: DC
  Administered 2018-02-10 – 2018-02-11 (×2): 325 mg via ORAL
  Filled 2018-02-09 (×2): qty 1

## 2018-02-09 MED ORDER — HYDRALAZINE HCL 50 MG PO TABS
50.0000 mg | ORAL_TABLET | Freq: Three times a day (TID) | ORAL | Status: DC
  Administered 2018-02-09 – 2018-02-11 (×3): 50 mg via ORAL
  Filled 2018-02-09 (×6): qty 1

## 2018-02-09 MED ORDER — ONDANSETRON HCL 4 MG/2ML IJ SOLN
INTRAMUSCULAR | Status: DC | PRN
  Administered 2018-02-09: 4 mg via INTRAVENOUS

## 2018-02-09 MED ORDER — HYDROMORPHONE HCL 1 MG/ML IJ SOLN
0.2500 mg | INTRAMUSCULAR | Status: DC | PRN
  Administered 2018-02-09 (×2): 0.5 mg via INTRAVENOUS

## 2018-02-09 MED ORDER — HEMOSTATIC AGENTS (NO CHARGE) OPTIME
TOPICAL | Status: DC | PRN
  Administered 2018-02-09: 1 via TOPICAL

## 2018-02-09 MED ORDER — ROCURONIUM BROMIDE 10 MG/ML (PF) SYRINGE
PREFILLED_SYRINGE | INTRAVENOUS | Status: AC
  Filled 2018-02-09: qty 10

## 2018-02-09 MED ORDER — METOPROLOL TARTRATE 5 MG/5ML IV SOLN: 2.0000 mg | INTRAVENOUS | Status: DC | PRN

## 2018-02-09 MED ORDER — ADULT MULTIVITAMIN W/MINERALS CH
1.0000 | ORAL_TABLET | Freq: Every day | ORAL | Status: DC
  Administered 2018-02-10 – 2018-02-11 (×2): 1 via ORAL
  Filled 2018-02-09 (×2): qty 1

## 2018-02-09 MED ORDER — MIDAZOLAM HCL 2 MG/2ML IJ SOLN
INTRAMUSCULAR | Status: AC
  Filled 2018-02-09: qty 2

## 2018-02-09 MED ORDER — SODIUM CHLORIDE 0.9 % IV SOLN: INTRAVENOUS | Status: DC

## 2018-02-09 MED ORDER — FENTANYL CITRATE (PF) 250 MCG/5ML IJ SOLN
INTRAMUSCULAR | Status: AC
  Filled 2018-02-09: qty 5

## 2018-02-09 MED ORDER — PHENYLEPHRINE 40 MCG/ML (10ML) SYRINGE FOR IV PUSH (FOR BLOOD PRESSURE SUPPORT)
PREFILLED_SYRINGE | INTRAVENOUS | Status: DC | PRN
  Administered 2018-02-09: 80 ug via INTRAVENOUS

## 2018-02-09 MED ORDER — LIDOCAINE 2% (20 MG/ML) 5 ML SYRINGE
INTRAMUSCULAR | Status: AC
  Filled 2018-02-09: qty 5

## 2018-02-09 MED ORDER — GLYCOPYRROLATE PF 0.2 MG/ML IJ SOSY
PREFILLED_SYRINGE | INTRAMUSCULAR | Status: AC
  Filled 2018-02-09: qty 1

## 2018-02-09 MED ORDER — EPHEDRINE SULFATE 50 MG/ML IJ SOLN
INTRAMUSCULAR | Status: DC | PRN
  Administered 2018-02-09 (×2): 10 mg via INTRAVENOUS

## 2018-02-09 MED ORDER — LACTATED RINGERS IV SOLN
INTRAVENOUS | Status: DC | PRN
  Administered 2018-02-09 (×2): via INTRAVENOUS

## 2018-02-09 MED ORDER — ONDANSETRON HCL 4 MG/2ML IJ SOLN
INTRAMUSCULAR | Status: AC
  Filled 2018-02-09: qty 2

## 2018-02-09 MED ORDER — ROSUVASTATIN CALCIUM 10 MG PO TABS
10.0000 mg | ORAL_TABLET | Freq: Every day | ORAL | Status: DC
  Administered 2018-02-10 – 2018-02-11 (×2): 10 mg via ORAL
  Filled 2018-02-09 (×2): qty 1

## 2018-02-09 MED ORDER — POLYETHYLENE GLYCOL 3350 17 G PO PACK: 17.0000 g | PACK | Freq: Every day | ORAL | Status: DC | PRN

## 2018-02-09 MED ORDER — SUCCINYLCHOLINE CHLORIDE 200 MG/10ML IV SOSY
PREFILLED_SYRINGE | INTRAVENOUS | Status: DC | PRN
  Administered 2018-02-09: 120 mg via INTRAVENOUS

## 2018-02-09 MED ORDER — DEXAMETHASONE SODIUM PHOSPHATE 10 MG/ML IJ SOLN
INTRAMUSCULAR | Status: AC
  Filled 2018-02-09: qty 1

## 2018-02-09 MED ORDER — POTASSIUM CHLORIDE CRYS ER 20 MEQ PO TBCR: 20.0000 meq | EXTENDED_RELEASE_TABLET | Freq: Every day | ORAL | Status: DC | PRN

## 2018-02-09 MED ORDER — MAGNESIUM SULFATE 2 GM/50ML IV SOLN: 2.0000 g | Freq: Every day | INTRAVENOUS | Status: DC | PRN

## 2018-02-09 MED ORDER — CLINDAMYCIN PHOSPHATE 300 MG/50ML IV SOLN
300.0000 mg | Freq: Three times a day (TID) | INTRAVENOUS | Status: AC
  Administered 2018-02-09 – 2018-02-10 (×3): 300 mg via INTRAVENOUS
  Filled 2018-02-09 (×3): qty 50

## 2018-02-09 MED ORDER — LIDOCAINE 2% (20 MG/ML) 5 ML SYRINGE
INTRAMUSCULAR | Status: DC | PRN
  Administered 2018-02-09: 60 mg via INTRAVENOUS

## 2018-02-09 MED ORDER — HEPARIN SODIUM (PORCINE) 5000 UNIT/ML IJ SOLN
5000.0000 [IU] | Freq: Three times a day (TID) | INTRAMUSCULAR | Status: DC
  Administered 2018-02-09 – 2018-02-11 (×5): 5000 [IU] via SUBCUTANEOUS
  Filled 2018-02-09 (×5): qty 1

## 2018-02-09 MED ORDER — NEBIVOLOL HCL 10 MG PO TABS
5.0000 mg | ORAL_TABLET | Freq: Every day | ORAL | Status: DC
Start: 2018-02-10 — End: 2018-02-11
  Administered 2018-02-11: 5 mg via ORAL
  Filled 2018-02-09 (×2): qty 1

## 2018-02-09 MED ORDER — HEPARIN SODIUM (PORCINE) 1000 UNIT/ML IJ SOLN
INTRAMUSCULAR | Status: AC
  Filled 2018-02-09: qty 2

## 2018-02-09 MED ORDER — HEPARIN SODIUM (PORCINE) 5000 UNIT/ML IJ SOLN: 5000.0000 [IU] | Freq: Three times a day (TID) | INTRAMUSCULAR | Status: DC

## 2018-02-09 MED ORDER — ALUM & MAG HYDROXIDE-SIMETH 200-200-20 MG/5ML PO SUSP: 15.0000 mL | ORAL | Status: DC | PRN

## 2018-02-09 MED ORDER — OXYCODONE-ACETAMINOPHEN 5-325 MG PO TABS
1.0000 | ORAL_TABLET | ORAL | Status: DC | PRN
  Administered 2018-02-09 – 2018-02-11 (×4): 2 via ORAL
  Filled 2018-02-09 (×3): qty 2

## 2018-02-09 MED ORDER — DEXAMETHASONE SODIUM PHOSPHATE 10 MG/ML IJ SOLN
INTRAMUSCULAR | Status: DC | PRN
  Administered 2018-02-09: 10 mg via INTRAVENOUS

## 2018-02-09 MED ORDER — ACETAMINOPHEN 325 MG PO TABS: 325.0000 mg | ORAL_TABLET | ORAL | Status: DC | PRN

## 2018-02-09 MED ORDER — 0.9 % SODIUM CHLORIDE (POUR BTL) OPTIME
TOPICAL | Status: DC | PRN
  Administered 2018-02-09: 1000 mL

## 2018-02-09 MED ORDER — FENTANYL CITRATE (PF) 250 MCG/5ML IJ SOLN
INTRAMUSCULAR | Status: DC | PRN
  Administered 2018-02-09 (×4): 50 ug via INTRAVENOUS
  Administered 2018-02-09 (×2): 25 ug via INTRAVENOUS

## 2018-02-09 MED ORDER — FUROSEMIDE 40 MG PO TABS
40.0000 mg | ORAL_TABLET | Freq: Every day | ORAL | Status: DC
  Administered 2018-02-10 – 2018-02-11 (×2): 40 mg via ORAL
  Filled 2018-02-09 (×2): qty 1

## 2018-02-09 MED ORDER — HYDROMORPHONE HCL 1 MG/ML IJ SOLN
INTRAMUSCULAR | Status: AC
  Filled 2018-02-09: qty 1

## 2018-02-09 MED ORDER — DOCUSATE SODIUM 100 MG PO CAPS
100.0000 mg | ORAL_CAPSULE | Freq: Every day | ORAL | Status: DC
  Administered 2018-02-10 – 2018-02-11 (×2): 100 mg via ORAL
  Filled 2018-02-09 (×2): qty 1

## 2018-02-09 MED ORDER — PROPOFOL 10 MG/ML IV BOLUS
INTRAVENOUS | Status: DC | PRN
  Administered 2018-02-09: 150 mg via INTRAVENOUS

## 2018-02-09 MED ORDER — MORPHINE SULFATE (PF) 4 MG/ML IV SOLN
4.0000 mg | INTRAVENOUS | Status: DC | PRN
  Administered 2018-02-09 – 2018-02-10 (×2): 4 mg via INTRAVENOUS
  Filled 2018-02-09 (×2): qty 1

## 2018-02-09 MED ORDER — AMLODIPINE BESYLATE 5 MG PO TABS
5.0000 mg | ORAL_TABLET | Freq: Every day | ORAL | Status: DC
  Administered 2018-02-11: 5 mg via ORAL
  Filled 2018-02-09 (×2): qty 1

## 2018-02-09 MED ORDER — SODIUM CHLORIDE 0.9 % IV SOLN
INTRAVENOUS | Status: DC | PRN
  Administered 2018-02-09: 40 ug/min via INTRAVENOUS

## 2018-02-09 MED ORDER — SPIRONOLACTONE 25 MG PO TABS
25.0000 mg | ORAL_TABLET | Freq: Every day | ORAL | Status: DC
  Administered 2018-02-10 – 2018-02-11 (×2): 25 mg via ORAL
  Filled 2018-02-09 (×2): qty 1

## 2018-02-09 MED ORDER — ROCURONIUM BROMIDE 10 MG/ML (PF) SYRINGE
PREFILLED_SYRINGE | INTRAVENOUS | Status: DC | PRN
  Administered 2018-02-09 (×2): 10 mg via INTRAVENOUS
  Administered 2018-02-09: 40 mg via INTRAVENOUS
  Administered 2018-02-09: 10 mg via INTRAVENOUS

## 2018-02-09 MED ORDER — SODIUM CHLORIDE 0.9 % IV SOLN: 500.0000 mL | Freq: Once | INTRAVENOUS | Status: DC | PRN

## 2018-02-09 MED ORDER — PROMETHAZINE HCL 25 MG/ML IJ SOLN: 6.2500 mg | INTRAMUSCULAR | Status: DC | PRN

## 2018-02-09 MED ORDER — SUCCINYLCHOLINE CHLORIDE 200 MG/10ML IV SOSY
PREFILLED_SYRINGE | INTRAVENOUS | Status: AC
Start: 2018-02-09 — End: ?
  Filled 2018-02-09: qty 10

## 2018-02-09 MED ORDER — MIDAZOLAM HCL 5 MG/5ML IJ SOLN
INTRAMUSCULAR | Status: DC | PRN
  Administered 2018-02-09: 1 mg via INTRAVENOUS

## 2018-02-09 MED ORDER — ACETAMINOPHEN 325 MG RE SUPP: 325.0000 mg | RECTAL | Status: DC | PRN

## 2018-02-09 MED ORDER — PHENOL 1.4 % MT LIQD: 1.0000 | OROMUCOSAL | Status: DC | PRN

## 2018-02-09 SURGICAL SUPPLY — 50 items
BANDAGE ESMARK 6X9 LF (GAUZE/BANDAGES/DRESSINGS) IMPLANT
BNDG ESMARK 6X9 LF (GAUZE/BANDAGES/DRESSINGS)
CANISTER SUCT 3000ML PPV (MISCELLANEOUS) ×3 IMPLANT
CANNULA VESSEL 3MM 2 BLNT TIP (CANNULA) ×6 IMPLANT
CLIP VESOCCLUDE MED 24/CT (CLIP) ×3 IMPLANT
CLIP VESOCCLUDE SM WIDE 24/CT (CLIP) ×3 IMPLANT
CUFF TOURNIQUET SINGLE 24IN (TOURNIQUET CUFF) IMPLANT
CUFF TOURNIQUET SINGLE 34IN LL (TOURNIQUET CUFF) IMPLANT
CUFF TOURNIQUET SINGLE 44IN (TOURNIQUET CUFF) IMPLANT
DERMABOND ADVANCED (GAUZE/BANDAGES/DRESSINGS) ×4
DERMABOND ADVANCED .7 DNX12 (GAUZE/BANDAGES/DRESSINGS) ×2 IMPLANT
DRAIN CHANNEL 15F RND FF W/TCR (WOUND CARE) IMPLANT
DRAPE HALF SHEET 40X57 (DRAPES) IMPLANT
DRAPE X-RAY CASS 24X20 (DRAPES) IMPLANT
ELECT REM PT RETURN 9FT ADLT (ELECTROSURGICAL) ×3
ELECTRODE REM PT RTRN 9FT ADLT (ELECTROSURGICAL) ×1 IMPLANT
EVACUATOR SILICONE 100CC (DRAIN) IMPLANT
GLOVE BIO SURGEON STRL SZ7.5 (GLOVE) ×3 IMPLANT
GLOVE BIOGEL PI IND STRL 8 (GLOVE) ×1 IMPLANT
GLOVE BIOGEL PI INDICATOR 8 (GLOVE) ×2
GLOVE INDICATOR 7.5 STRL GRN (GLOVE) ×9 IMPLANT
GOWN STRL REUS W/ TWL LRG LVL3 (GOWN DISPOSABLE) ×3 IMPLANT
GOWN STRL REUS W/TWL LRG LVL3 (GOWN DISPOSABLE) ×6
HEMOSTAT SNOW SURGICEL 2X4 (HEMOSTASIS) ×3 IMPLANT
KIT BASIN OR (CUSTOM PROCEDURE TRAY) ×3 IMPLANT
KIT TURNOVER KIT B (KITS) ×3 IMPLANT
MARKER GRAFT CORONARY BYPASS (MISCELLANEOUS) IMPLANT
NS IRRIG 1000ML POUR BTL (IV SOLUTION) ×9 IMPLANT
PACK PERIPHERAL VASCULAR (CUSTOM PROCEDURE TRAY) ×3 IMPLANT
PAD ARMBOARD 7.5X6 YLW CONV (MISCELLANEOUS) ×6 IMPLANT
SET COLLECT BLD 21X3/4 12 (NEEDLE) IMPLANT
SPONGE SURGIFOAM ABS GEL 100 (HEMOSTASIS) IMPLANT
STOPCOCK 4 WAY LG BORE MALE ST (IV SETS) IMPLANT
SUT ETHILON 3 0 PS 1 (SUTURE) IMPLANT
SUT PROLENE 5 0 C 1 24 (SUTURE) ×9 IMPLANT
SUT PROLENE 6 0 BV (SUTURE) ×3 IMPLANT
SUT SILK 2 0 PERMA HAND 18 BK (SUTURE) ×3 IMPLANT
SUT SILK 2 0 SH (SUTURE) ×3 IMPLANT
SUT SILK 3 0 (SUTURE)
SUT SILK 3-0 18XBRD TIE 12 (SUTURE) IMPLANT
SUT VIC AB 2-0 CTB1 (SUTURE) ×6 IMPLANT
SUT VIC AB 3-0 SH 27 (SUTURE) ×4
SUT VIC AB 3-0 SH 27X BRD (SUTURE) ×2 IMPLANT
SUT VICRYL 4-0 PS2 18IN ABS (SUTURE) ×6 IMPLANT
TOWEL GREEN STERILE (TOWEL DISPOSABLE) ×3 IMPLANT
TRAY FOLEY BAG SILVER LF 14FR (CATHETERS) ×3 IMPLANT
TRAY FOLEY MTR SLVR 16FR STAT (SET/KITS/TRAYS/PACK) ×3 IMPLANT
TUBING EXTENTION W/L.L. (IV SETS) IMPLANT
UNDERPAD 30X30 (UNDERPADS AND DIAPERS) ×3 IMPLANT
WATER STERILE IRR 1000ML POUR (IV SOLUTION) ×3 IMPLANT

## 2018-02-09 NOTE — Anesthesia Postprocedure Evaluation (Signed)
Anesthesia Post Note  Patient: Kenneth Lee  Procedure(s) Performed: LEFT COMMON FEMORAL ENDARTERECTOMY WITH VEIN PATCH ANGIOPLASTY REVISION DISTAL LEFT LIMB OF AORTOFEMORAL BYPASS GRAFT (Left Leg Upper)     Patient location during evaluation: PACU Anesthesia Type: General Level of consciousness: awake and alert Pain management: pain level controlled Vital Signs Assessment: post-procedure vital signs reviewed and stable Respiratory status: spontaneous breathing, nonlabored ventilation, respiratory function stable and patient connected to nasal cannula oxygen Cardiovascular status: blood pressure returned to baseline and stable Postop Assessment: no apparent nausea or vomiting Anesthetic complications: no    Last Vitals:  Vitals:   02/09/18 1312 02/09/18 1339  BP: (!) 91/47 (!) 102/48  Pulse: (!) 52   Resp: 13 18  Temp:  (!) 36.4 C  SpO2: 95% 97%    Last Pain:  Vitals:   02/09/18 1339  TempSrc: Oral  PainSc:                  [name]

## 2018-02-09 NOTE — Progress Notes (Signed)
PT Cancellation Note  Patient Details Name: ALPHONSE ASBRIDGE MRN: [id number] DOB: 14-Nov-1945   Cancelled Treatment:    Reason Eval/Treat Not Completed: Other (comment). Sleepy after OT and pain meds.    Shary Decamp Maycok 02/09/2018, 3:51 PM [employer] PT [phone]

## 2018-02-09 NOTE — Anesthesia Preprocedure Evaluation (Addendum)
Anesthesia Evaluation  Patient identified by MRN, date of birth, ID band Patient awake    Reviewed: Allergy & Precautions, NPO status , Patient's Chart, lab work & pertinent test results  Airway Mallampati: III  TM Distance: <3 FB Neck ROM: Full    Dental no notable dental hx.    Pulmonary sleep apnea and Continuous Positive Airway Pressure Ventilation , former smoker,    Pulmonary exam normal breath sounds clear to auscultation       Cardiovascular hypertension, + CAD, + Cardiac Stents and + Peripheral Vascular Disease  Normal cardiovascular exam Rhythm:Regular Rate:Normal     Neuro/Psych negative neurological ROS  negative psych ROS   GI/Hepatic negative GI ROS, Neg liver ROS,   Endo/Other  diabetes  Renal/GU negative Renal ROS  negative genitourinary   Musculoskeletal negative musculoskeletal ROS (+)   Abdominal   Peds negative pediatric ROS (+)  Hematology negative hematology ROS (+)   Anesthesia Other Findings   Reproductive/Obstetrics negative OB ROS                            Anesthesia Physical Anesthesia Plan  ASA: III  Anesthesia Plan: General   Post-op Pain Management:    Induction: Intravenous  PONV Risk Score and Plan: 2 and Ondansetron, Dexamethasone and Treatment may vary due to age or medical condition  Airway Management Planned: Oral ETT and Video Laryngoscope Planned  Additional Equipment:   Intra-op Plan:   Post-operative Plan: Extubation in OR  Informed Consent: I have reviewed the patients History and Physical, chart, labs and discussed the procedure including the risks, benefits and alternatives for the proposed anesthesia with the patient or authorized representative who has indicated his/her understanding and acceptance.   Dental advisory given  Plan Discussed with: CRNA and Surgeon  Anesthesia Plan Comments:        Anesthesia Quick  Evaluation

## 2018-02-09 NOTE — Anesthesia Procedure Notes (Signed)
Procedure Name: Intubation Date/Time: 02/09/2018 7:40 AM Performed by: Marsa Aris, CRNA Pre-anesthesia Checklist: Patient identified, Emergency Drugs available, Suction available, Patient being monitored and Timeout performed Patient Re-evaluated:Patient Re-evaluated prior to induction Oxygen Delivery Method: Circle system utilized Preoxygenation: Pre-oxygenation with 100% oxygen Induction Type: IV induction Ventilation: Nasal airway inserted- appropriate to patient size and Two handed mask ventilation required Laryngoscope Size: Glidescope and 3 Grade View: Grade I Number of attempts: 1 Airway Equipment and Method: Stylet,  Patient positioned with wedge pillow and Video-laryngoscopy Placement Confirmation: ETT inserted through vocal cords under direct vision,  positive ETCO2,  CO2 detector and breath sounds checked- equal and bilateral Secured at: 22 cm Tube secured with: Tape Dental Injury: Teeth and Oropharynx as per pre-operative assessment  Comments: Cspine neutrality maintained, no change in dentition from pre-procedure

## 2018-02-09 NOTE — Transfer of Care (Signed)
Immediate Anesthesia Transfer of Care Note  Patient: BREYER TEJERA  Procedure(s) Performed: LEFT COMMON FEMORAL ENDARTERECTOMY WITH VEIN PATCH ANGIOPLASTY REVISION DISTAL LEFT LIMB OF AORTOFEMORAL BYPASS GRAFT (Left Leg Upper)  Patient Location: PACU  Anesthesia Type:General  Level of Consciousness: awake, alert  and oriented  Airway & Oxygen Therapy: Patient Spontanous Breathing and Patient connected to nasal cannula oxygen  Post-op Assessment: Report given to RN and Post -op Vital signs reviewed and stable  Post vital signs: Reviewed and stable  Last Vitals:  Vitals Value Taken Time  BP 114/54 02/09/2018 11:11 AM  Temp    Pulse 72 02/09/2018 11:16 AM  Resp 15 02/09/2018 11:16 AM  SpO2 93 % 02/09/2018 11:16 AM  Vitals shown include unvalidated device data.  Last Pain:  Vitals:   02/09/18 0557  TempSrc:   PainSc: 0-No pain      Patients Stated Pain Goal: 0 (20/25/42 7062)  Complications: No apparent anesthesia complications

## 2018-02-09 NOTE — Evaluation (Signed)
Occupational Therapy Evaluation Patient Details Name: Kenneth Lee MRN: [id number] DOB: [birth year] Today's Date: 02/09/2018    History of Present Illness 72 yo male s/p endarterectomy of common femoral artery, deep femoral artery, and superficial femoral artery with revision of left limb of aortofemoral bypass graft with vein patch angioplasty using left great saphenous vein. PHMx of CAD, HTN, DM, L2-3/L3-5 fusion   Clinical Impression   This 72 yo male admitted and underwent above presents to acute OT with decreased mobility, decreased balance, increased pain all affecting his safety and independence with basic ADLs. He will benefit from acute OT without need for follow up.    Follow Up Recommendations  No OT follow up;Supervision - Intermittent    Equipment Recommendations  None recommended by OT       Precautions / Restrictions Precautions Precautions: Fall Restrictions Weight Bearing Restrictions: No      Mobility Bed Mobility Overal bed mobility: Needs Assistance Bed Mobility: Supine to Sit     Supine to sit: Supervision;HOB elevated        Transfers Overall transfer level: Needs assistance Equipment used: Rolling walker (2 wheeled) Transfers: Sit to/from Stand Sit to Stand: Min assist              Balance Overall balance assessment: Needs assistance Sitting-balance support: No upper extremity supported;Feet supported Sitting balance-Leahy Scale: Good     Standing balance support: Bilateral upper extremity supported Standing balance-Leahy Scale: Poor                             ADL either performed or assessed with clinical judgement   ADL Overall ADL's : Needs assistance/impaired Eating/Feeding: Independent;Sitting   Grooming: Set up;Sitting   Upper Body Bathing: Set up;Sitting   Lower Body Bathing: Moderate assistance Lower Body Bathing Details (indicate cue type and reason): min A sit<>stand Upper Body Dressing : Set  up;Sitting   Lower Body Dressing: Maximal assistance Lower Body Dressing Details (indicate cue type and reason): min A sit<>stand Toilet Transfer: Minimal assistance;Stand-pivot;RW   Toileting- Clothing Manipulation and Hygiene: Minimal assistance;Sit to/from stand         General ADL Comments: Educated about stepping into and out of tub with good leg then operated leg     Vision Patient Visual Report: No change from baseline              Pertinent Vitals/Pain Pain Assessment: 0-10 Pain Score: 6  Pain Location: LLE Pain Descriptors / Indicators: Aching;Sore Pain Intervention(s): Limited activity within patient's tolerance;Monitored during session;Repositioned     Hand Dominance Right   Extremity/Trunk Assessment Upper Extremity Assessment Upper Extremity Assessment: Overall WFL for tasks assessed           Communication Communication Communication: No difficulties   Cognition Arousal/Alertness: Awake/alert Behavior During Therapy: WFL for tasks assessed/performed Overall Cognitive Status: Within Functional Limits for tasks assessed                                                Home Living Family/patient expects to be discharged to:: Private residence Living Arrangements: Alone Available Help at Discharge: Family;Friend(s);Available PRN/intermittently Type of Home: House Home Access: Stairs to enter [employer] of Steps: 5 Entrance Stairs-Rails: Left(going up) Home Layout: One level     Bathroom Shower/Tub: Tub/shower unit;Door   [employer]  Toilet: Standard     Home Equipment: Walker - 2 wheels;Cane - single point          Prior Functioning/Environment Level of Independence: Independent        Comments: uses SPC intermittently pta        OT Problem List: Decreased strength;Decreased range of motion;Impaired balance (sitting and/or standing);Pain      OT Treatment/Interventions: Self-care/ADL training;Balance  training;DME and/or AE instruction;Patient/family education;Therapeutic activities    OT Goals(Current goals can be found in the care plan section) Acute Rehab OT Goals Patient Stated Goal: home soon OT Goal Formulation: With patient Time For Goal Achievement: 02/23/18 Potential to Achieve Goals: Good  OT Frequency: Min 2X/week              AM-PAC PT "6 Clicks" Daily Activity     Outcome Measure Help from another person eating meals?: None Help from another person taking care of personal grooming?: A Little Help from another person toileting, which includes using toliet, bedpan, or urinal?: A Little Help from another person bathing (including washing, rinsing, drying)?: A Lot Help from another person to put on and taking off regular upper body clothing?: A Little Help from another person to put on and taking off regular lower body clothing?: A Lot 6 Click Score: 17   End of Session Equipment Utilized During Treatment: Rolling walker Nurse Communication: Mobility status  Activity Tolerance: Patient tolerated treatment well Patient left: in chair;with call bell/phone within reach(RN said no to pt need for chair alarm)  OT Visit Diagnosis: Unsteadiness on feet (R26.81);Muscle weakness (generalized) (M62.81);Pain Pain - Right/Left: Left Pain - part of body: Leg                Time: 4098-1191 OT Time Calculation (min): 33 min Charges:  OT General Charges $OT Visit: 1 Visit OT Evaluation $OT Eval Moderate Complexity: [address], [place] [phone] 02/09/2018

## 2018-02-09 NOTE — Progress Notes (Signed)
   VASCULAR SURGERY POSTOP:   Stable postop.  Anticipate discharge tomorrow or Sunday.  He has monophasic signals in the left foot but has a known superficial femoral artery occlusion.  SUBJECTIVE:   No complaints.  He states that his foot feels better.  PHYSICAL EXAM:   Vitals:   02/09/18 1255 02/09/18 1310 02/09/18 1312 02/09/18 1339  BP: (!) 98/49 (!) 92/41 (!) 91/47 (!) 102/48  Pulse: (!) 54 (!) 51 (!) 52   Resp: 15 15 13 18   Temp:    (!) 97.5 F (36.4 C)  TempSrc:    Oral  SpO2: 95% 95% 95% 97%  Weight:      Height:    5\' 11"  (1.803 m)   Monophasic anterior tibial and posterior tibial signal with the Doppler left foot.   His incisions look fine.  LABS:   Lab Results  Component Value Date   WBC 10.9 (H) 01/31/2018   HGB 14.2 01/31/2018   HCT 44.2 01/31/2018   MCV 99.3 01/31/2018   PLT 180 01/31/2018   Lab Results  Component Value Date   CREATININE 1.17 01/31/2018   Lab Results  Component Value Date   INR 1.06 01/31/2018   CBG (last 3)  Recent Labs    02/09/18 0546 02/09/18 1121 02/09/18 1612  GLUCAP 151* 190* 204*    PROBLEM LIST:    Active Problems:   PAD (peripheral artery disease) (HCC)   CURRENT MEDS:   . [START ON 02/10/2018] amLODipine  5 mg Oral Daily  . [START ON 02/10/2018] aspirin  325 mg Oral Daily  . [START ON 02/10/2018] docusate sodium  100 mg Oral Daily  . fluticasone  2 spray Each Nare QHS  . furosemide  40 mg Oral Daily  . heparin  5,000 Units Subcutaneous Q8H  . hydrALAZINE  50 mg Oral TID  . HYDROmorphone      . insulin aspart  0-15 Units Subcutaneous TID WC  . lisinopril  20 mg Oral BID  . multivitamin with minerals  1 tablet Oral Daily  . [START ON 02/10/2018] nebivolol  5 mg Oral Daily  . oxyCODONE-acetaminophen      . pantoprazole  40 mg Oral Daily  . rosuvastatin  10 mg Oral Daily  . spironolactone  25 mg Oral Daily    [name] Beeper: [phone] Office: [phone] 02/09/2018

## 2018-02-09 NOTE — Progress Notes (Signed)
Pt has home CPAP.  RT got CPAP hocked up and ready for pt. Rt also added sterile water to chamber.

## 2018-02-09 NOTE — Interval H&P Note (Signed)
History and Physical Interval Note:  02/09/2018 7:25 AM  Kenneth Lee  has presented today for surgery, with the diagnosis of PERIPHERAL VASCULAR DISEASE  The various methods of treatment have been discussed with the patient and family. After consideration of risks, benefits and other options for treatment, the patient has consented to  Procedure(s): LEFT COMMON FEMORAL ENDARTERECTOMY WITH VEIN PATCH ANGIOPLASTY REVISION DISTAL LEFT LIMB OF AORTOFEMORAL BYPASS GRAFT (Left) as a surgical intervention .  The patient's history has been reviewed, patient examined, no change in status, stable for surgery.  I have reviewed the patient's chart and labs.  Questions were answered to the patient's satisfaction.     [name]

## 2018-02-09 NOTE — Op Note (Signed)
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 [name], MD, FACS  ASSIST: Arlee Muslim, PA  ANESTHESIA: General  EBL: Minimal  INDICATIONS:    Kenneth Lee is a 72 y.o. male who it undergone an aortobifemoral bypass graft in 1996.  On a routine follow-up visit he was noted to have significant stenosis in the proximal deep femoral artery and superficial femoral artery.  He presents for endarterectomy and revision given the risk of graft thrombosis because of his outflow disease.  FINDINGS:   There was significant calcific plaque involving the common femoral deep femoral and superficial femoral arteries.  There was a good endpoint in the superficial femoral artery.  The deep femoral artery was enlarged and thin.  TECHNIQUE:   The patient was taken to the operating room and received a general anesthetic.  The lower abdomen left groin and entire left lower extremity were prepped and draped in the usual sterile fashion.  A longitudinal incision was made in the left groin.  Through this incision I dissected through significant scar tissue to dissected out the left limb of the aortofemoral bypass graft.  I was also able to dissected out the native left external iliac artery.  The superficial femoral artery was also controlled.  The deep femoral artery was large and the artery was quite thin.  The plaque was right at the origin of the deep femoral artery.  There are multiple large branches which were all controlled.  The patient was then heparinized.  Through the same incision I dissected  the saphenous vein.  In order to have adequate length and made a separate incision further down the thigh longitudinally to expose a longer length of saphenous vein.  Branches were divided between clips and 3-0 silk ties.  The vein was excised and then opened longitudinally to be used as a vein patch.  The left limb of the aortofemoral bypass graft was clamped, as was the external iliac artery, superficial femoral artery, and all of the deep femoral artery branches.  A longitudinal arteriotomy was made in the superficial femoral artery and extended through the plaque onto the left limb of the aortofemoral bypass graft.  There was some laminated thrombus here that was removed.  There was significant calcific plaque which was endarterectomized.  The endarterectomy was performed at the proximal superficial femoral artery.  I was able to visualize the origin of the deep femoral artery and he had a large bulky calcific plaque here.  I under endarterectomized this it was difficult to get a clear visualization.  I was reluctant to open the deep femoral artery downstream from this given that it was directly posterior with significantly thin and I thought it would be risky to operate on given how thin it was.  After the with running 5-0 Prolene suture.  Prior to completing the anastomosis the arteries were all backbled and flushed properly and the anastomosis completed.  Flow was reestablished first to the deep femoral artery and into the superficial femoral artery.  Endarterectomy was complete the artery was irrigated with copious amounts of  saline.  The vein patch was then sewn at the completion there was a good signal in the deep femoral artery and all of its branches with good diastolic flow.  There was good flow in the superficial femoral artery although a more high resistance signal given the known distal occlusion.  The heparin was partially reversed with protamine.  The groin incision was closed with 2 deep  layers of 2-0 Vicryl, a subcutaneous layer with 3-0 Vicryl and the skin closed with 4-0 Vicryl.  The vein harvest incision was closed with 2 deep layers of 3-0 Vicryl and the skin closed with 4-0 Vicryl.  Dermabond was applied.  Patient tolerated the procedure well and was transferred to recovery room in stable condition.  All needle and sponge counts were correct.  The  [name], MD, FACS [facility]  DATE OF DICTATION:   02/09/2018

## 2018-02-10 ENCOUNTER — Encounter (HOSPITAL_COMMUNITY): Payer: Self-pay | Admitting: Vascular Surgery

## 2018-02-10 LAB — GLUCOSE, CAPILLARY
Glucose-Capillary: 179 mg/dL — ABNORMAL HIGH (ref 70–99)
Glucose-Capillary: 125 mg/dL — ABNORMAL HIGH (ref 70–99)
Glucose-Capillary: 145 mg/dL — ABNORMAL HIGH (ref 70–99)
Glucose-Capillary: 160 mg/dL — ABNORMAL HIGH (ref 70–99)

## 2018-02-10 LAB — BASIC METABOLIC PANEL
Anion gap: 8 (ref 5–15)
BUN: 24 mg/dL — ABNORMAL HIGH (ref 8–23)
CO2: 23 mmol/L (ref 22–32)
Calcium: 8.7 mg/dL — ABNORMAL LOW (ref 8.9–10.3)
Chloride: 108 mmol/L (ref 98–111)
Creatinine, Ser: 0.93 mg/dL (ref 0.61–1.24)
GFR calc Af Amer: >60 mL/min (ref >60)
GFR calc non Af Amer: >60 mL/min (ref >60)
Glucose, Bld: 182 mg/dL — ABNORMAL HIGH (ref 70–99)
Potassium: 4.8 mmol/L (ref 3.5–5.1)
Sodium: 139 mmol/L (ref 135–145)

## 2018-02-10 LAB — CBC
HCT: 36.1 % — ABNORMAL LOW (ref 39.0–52.0)
Hemoglobin: 11.7 g/dL — ABNORMAL LOW (ref 13.0–17.0)
MCH: 32.2 pg (ref 26.0–34.0)
MCHC: 32.4 g/dL (ref 30.0–36.0)
MCV: 99.4 fL (ref 78.0–100.0)
Platelets: 168 10*3/uL (ref 150–400)
RBC: 3.63 MIL/uL — ABNORMAL LOW (ref 4.22–5.81)
RDW: 13.1 % (ref 11.5–15.5)
WBC: 13.6 10*3/uL — ABNORMAL HIGH (ref 4.0–10.5)

## 2018-02-10 MED ORDER — OXYCODONE-ACETAMINOPHEN 5-325 MG PO TABS: 1.0000 | ORAL_TABLET | Freq: Four times a day (QID) | ORAL | 0 refills | Status: DC | PRN

## 2018-02-10 MED ORDER — DIPHENHYDRAMINE HCL 25 MG PO CAPS
50.0000 mg | ORAL_CAPSULE | Freq: Every day | ORAL | Status: DC
  Administered 2018-02-10: 50 mg via ORAL
  Filled 2018-02-10: qty 2

## 2018-02-10 NOTE — Progress Notes (Signed)
Pt has his home CPAP for the night.

## 2018-02-10 NOTE — Discharge Instructions (Signed)
 [facility]  Discharge instructions  Lower Extremity Bypass Surgery  Please refer to the following instruction for your post-procedure care. Your surgeon or physician assistant will discuss any changes with you.  Activity  You are encouraged to walk as much as you can. You can slowly return to normal activities during the month after your surgery. Avoid strenuous activity and heavy lifting until your doctor tells you it's OK. Avoid activities such as vacuuming or swinging a golf club. Do not drive until your doctor give the OK and you are no longer taking prescription pain medications. It is also normal to have difficulty with sleep habits, eating and bowel movement after surgery. These will go away with time.  Bathing/Showering  You may shower after you go home. Do not soak in a bathtub, hot tub, or swim until the incision heals completely.  Incision Care  Clean your incision with mild soap and water. Shower every day. Pat the area dry with a clean towel. You do not need a bandage unless otherwise instructed. Do not apply any ointments or creams to your incision. If you have open wounds you will be instructed how to care for them or a visiting nurse may be arranged for you. If you have staples or sutures along your incision they will be removed at your post-op appointment. You may have skin glue on your incision. Do not peel it off. It will come off on its own in about one week. If you have a great deal of moisture in your groin, use a gauze help keep this area dry.  Diet  Resume your normal diet. There are no special food restrictions following this procedure. A low fat/ low cholesterol diet is recommended for all patients with vascular disease. In order to heal from your surgery, it is CRITICAL to get adequate nutrition. Your body requires vitamins, minerals, and protein. Vegetables are the best source of vitamins and minerals. Vegetables also provide the  perfect balance of protein. Processed food has little nutritional value, so try to avoid this.  Medications  Resume taking all your medications unless your doctor or nurse practitioner tells you not to. If your incision is causing pain, you may take over-the-counter pain relievers such as acetaminophen (Tylenol). If you were prescribed a stronger pain medication, please aware these medication can cause nausea and constipation. Prevent nausea by taking the medication with a snack or meal. Avoid constipation by drinking plenty of fluids and eating foods with high amount of fiber, such as fruits, vegetables, and grains. Take Colase 100 mg (an over-the-counter stool softener) twice a day as needed for constipation. Do not take Tylenol if you are taking prescription pain medications.  Follow Up  Our office will schedule a follow up appointment 2-3 weeks following discharge.  Please call us immediately for any of the following conditions  Severe or worsening pain in your legs or feet while at rest or while walking Increase pain, redness, warmth, or drainage (pus) from your incision site(s) Fever of 101 degree or higher The swelling in your leg with the bypass suddenly worsens and becomes more painful than when you were in the hospital If you have been instructed to feel your graft pulse then you should do so every day. If you can no longer feel this pulse, call the office immediately. Not all patients are given this instruction.  Leg swelling is common after leg bypass surgery.  The swelling should improve over a few months   following surgery. To improve the swelling, you may elevate your legs above the level of your heart while you are sitting or resting. Your surgeon or physician assistant may ask you to apply an ACE wrap or wear compression (TED) stockings to help to reduce swelling.  Reduce your risk of vascular disease  Stop smoking. If you would like help call [facility] at 1-800-QUIT-NOW  (1-800-784-8669) or [place] at 336-586-4000.  Manage your cholesterol Maintain a desired weight Control your diabetes weight Control your diabetes Keep your blood pressure down  If you have any questions, please call the office at [phone]   

## 2018-02-10 NOTE — Progress Notes (Signed)
Occupational Therapy Treatment and Discharge Patient Details Name: Kenneth Lee MRN: [id number] DOB: 19-Mar-1946 Today's Date: 02/10/2018    History of present illness 72 yo male s/p endarterectomy of common femoral artery, deep femoral artery, and superficial femoral artery with revision of left limb of aortofemoral bypass graft with vein patch angioplasty using left great saphenous vein. PHMx of CAD, HTN, DM, L2-3/L3-5 fusion   OT comments  This 72 yo male admitted and underwent above presents to acute OT with all education completed, we will sign off.   Follow Up Recommendations  No OT follow up;Supervision - Intermittent    Equipment Recommendations  None recommended by OT       Precautions / Restrictions Precautions Precautions: Fall Restrictions Weight Bearing Restrictions: No       Mobility Bed Mobility Overal bed mobility: Independent                Transfers Overall transfer level: Needs assistance Equipment used: Rolling walker (2 wheeled) Transfers: Sit to/from Stand Sit to Stand: Supervision              Balance Overall balance assessment: Needs assistance Sitting-balance support: No upper extremity supported;Feet supported Sitting balance-Leahy Scale: Good     Standing balance support: No upper extremity supported Standing balance-Leahy Scale: Fair                             ADL either performed or assessed with clinical judgement   ADL Overall ADL's : Needs assistance/impaired                                       General ADL Comments: Pt able to tell me which leg he should step into/out of tub with first (non-operated one), he reports he will not wear socks for now, will wear slip on shoes, educated on which leg to put in first for dressing underwear and pants (operated one). He reports he was able to get on and off toilet by himself this AM with grab bar with tech S and that he does not foresee any issues  with getting on/off toilet at home, educated on safety with RW in kitchen and moving items from point A to point B.     Vision Patient Visual Report: No change from baseline            Cognition Arousal/Alertness: Awake/alert Behavior During Therapy: WFL for tasks assessed/performed Overall Cognitive Status: Within Functional Limits for tasks assessed                                                     Pertinent Vitals/ Pain       Pain Assessment: Faces Faces Pain Scale: Hurts a little bit Pain Location: LLE Pain Descriptors / Indicators: Sore Pain Intervention(s): Limited activity within patient's tolerance;Monitored during session;Repositioned         Frequency  Min 2X/week        Progress Toward Goals  OT Goals(current goals can now be found in the care plan section)  Progress towards OT goals: (All education completed, pt without further concerns,questions about basic ADLs)            AM-PAC PT "6  Clicks" Daily Activity     Outcome Measure   Help from another person eating meals?: None Help from another person taking care of personal grooming?: A Little Help from another person toileting, which includes using toliet, bedpan, or urinal?: A Little Help from another person bathing (including washing, rinsing, drying)?: A Little Help from another person to put on and taking off regular upper body clothing?: A Little Help from another person to put on and taking off regular lower body clothing?: A Little 6 Click Score: 19    End of Session Equipment Utilized During Treatment: Rolling walker  OT Visit Diagnosis: Unsteadiness on feet (R26.81);Muscle weakness (generalized) (M62.81);Pain Pain - Right/Left: Left Pain - part of body: Leg   Activity Tolerance Patient tolerated treatment well   Patient Left in chair;with call bell/phone within reach   Nurse Communication Mobility status(NT)        Time: 7829-5621 OT Time Calculation  (min): 18 min  Charges: OT General Charges $OT Visit: 1 Visit OT Treatments $Self Care/Home Management : 8-22 mins  [address], OTR/L [phone] 02/10/2018

## 2018-02-10 NOTE — Evaluation (Signed)
Physical Therapy Evaluation Patient Details Name: Kenneth Lee MRN: [id number] DOB: 07-20-1945 Today's Date: 02/10/2018   History of Present Illness  Pt is a 72 y/o male s/p endarterectomy of common femoral artery, deep femoral artery, and superficial femoral artery with revision of left limb of aortofemoral bypass graft with vein patch angioplasty using left great saphenous vein. PHMx of CAD, HTN, DM, L2-3/L3-5 fusion    Clinical Impression  Pt presented sitting OOB in recliner chair, awake and willing to participate in therapy session. Prior to admission, pt reported that he was independent with ADLs and ambulated with use of SPC PRN secondary to back pain. Pt currently able to perform transfers with supervision and ambulate within his room with RW and min guard for safety. Limited ambulation distance this session secondary to fluctuating BP (BP sitting = 117/48; BP standing = 161/58; HR sitting = 44, HR with activity = mid 70's - RN in room and aware, requesting to stay in room). PT will continue to follow acutely to progress mobility as tolerated.     Follow Up Recommendations No PT follow up    Equipment Recommendations  None recommended by PT    Recommendations for Other Services       Precautions / Restrictions Precautions Precautions: Fall Restrictions Weight Bearing Restrictions: No      Mobility  Bed Mobility Overal bed mobility: Independent             General bed mobility comments: pt OOB in recliner chair upon arrival  Transfers Overall transfer level: Needs assistance Equipment used: Rolling walker (2 wheeled) Transfers: Sit to/from Stand Sit to Stand: Supervision         General transfer comment: supervision for safety  Ambulation/Gait Ambulation/Gait assistance: Min guard Gait Distance (Feet): 30 Feet Assistive device: Rolling walker (2 wheeled) Gait Pattern/deviations: Step-through pattern;Decreased stride length Gait velocity: decreased -  navigating around in room   General Gait Details: pt steady with RW and navigating well around his room; limited secondary to fluctuation in BP and low diastolic BP (RN aware)  Stairs            Wheelchair Mobility    Modified Rankin (Stroke Patients Only)       Balance Overall balance assessment: Needs assistance Sitting-balance support: Feet supported Sitting balance-Leahy Scale: Good     Standing balance support: No upper extremity supported Standing balance-Leahy Scale: Fair                               Pertinent Vitals/Pain Pain Assessment: No/denies pain Faces Pain Scale: Hurts a little bit Pain Location: LLE Pain Descriptors / Indicators: Sore Pain Intervention(s): Limited activity within patient's tolerance;Monitored during session;Repositioned    Home Living Family/patient expects to be discharged to:: Private residence Living Arrangements: Alone Available Help at Discharge: Family;Friend(s);Available PRN/intermittently Type of Home: House Home Access: Stairs to enter Entrance Stairs-Rails: Left(going up) Entrance Stairs-Number of Steps: 5 Home Layout: One level Home Equipment: Walker - 2 wheels;Cane - single point      Prior Function Level of Independence: Independent with assistive device(s)         Comments: pt reported that he ambulated with [facility] PRN     Hand Dominance        Extremity/Trunk Assessment   Upper Extremity Assessment Upper Extremity Assessment: Overall WFL for tasks assessed    Lower Extremity Assessment Lower Extremity Assessment: Overall WFL for tasks assessed  Communication   Communication: No difficulties  Cognition Arousal/Alertness: Awake/alert Behavior During Therapy: WFL for tasks assessed/performed Overall Cognitive Status: Within Functional Limits for tasks assessed                                        General Comments      Exercises     Assessment/Plan    PT  Assessment Patient needs continued PT services  PT Problem List Decreased balance;Decreased mobility;Decreased coordination       PT Treatment Interventions DME instruction;Gait training;Stair training;Functional mobility training;Therapeutic exercise;Therapeutic activities;Balance training;Neuromuscular re-education;Patient/family education    PT Goals (Current goals can be found in the Care Plan section)  Acute Rehab PT Goals Patient Stated Goal: to return home PT Goal Formulation: With patient Time For Goal Achievement: 02/24/18 Potential to Achieve Goals: Good    Frequency Min 3X/week   Barriers to discharge        Co-evaluation               AM-PAC PT "6 Clicks" Daily Activity  Outcome Measure Difficulty turning over in bed (including adjusting bedclothes, sheets and blankets)?: None Difficulty moving from lying on back to sitting on the side of the bed? : None Difficulty sitting down on and standing up from a chair with arms (e.g., wheelchair, bedside commode, etc,.)?: None Help needed moving to and from a bed to chair (including a wheelchair)?: None Help needed walking in hospital room?: None Help needed climbing 3-5 steps with a railing? : A Little 6 Click Score: 23    End of Session   Activity Tolerance: Patient tolerated treatment well Patient left: in chair;with call bell/phone within reach;Other (comment)(RN in room) Nurse Communication: Mobility status;Other (comment)(BP) PT Visit Diagnosis: Other abnormalities of gait and mobility (R26.89)    Time: 9480-1655 PT Time Calculation (min) (ACUTE ONLY): 15 min   Charges:   PT Evaluation $PT Eval Moderate Complexity: [place], [place], [place] Slater 02/10/2018, 10:56 AM

## 2018-02-10 NOTE — Progress Notes (Addendum)
  Progress Note    02/10/2018 11:04 AM 1 Day Post-Op  Subjective:  No complaints during exam this morning   Vitals:   02/10/18 0715 02/10/18 1000  BP: (!) 124/55 (!) 117/48  Pulse: (!) 46 66  Resp: 14   Temp: 97.7 F (36.5 C) 97.6 F (36.4 C)  SpO2: 95% 100%   Physical Exam: Cardiac:  RRR Lungs:  Non labored Incisions:  L groin incision c/d/i Extremities:  L PTA/ATA by doppler Abdomen:  Soft Neurologic: A&O  CBC    Component Value Date/Time   WBC 13.6 (H) 02/10/2018 0405   RBC 3.63 (L) 02/10/2018 0405   HGB 11.7 (L) 02/10/2018 0405   HCT 36.1 (L) 02/10/2018 0405   PLT 168 02/10/2018 0405   MCV 99.4 02/10/2018 0405   MCH 32.2 02/10/2018 0405   MCHC 32.4 02/10/2018 0405   RDW 13.1 02/10/2018 0405   LYMPHSABS 2.6 05/08/2015 0630   MONOABS 0.7 05/08/2015 0630   EOSABS 0.1 05/08/2015 0630   BASOSABS 0.0 05/08/2015 0630    BMET    Component Value Date/Time   NA 139 02/10/2018 0405   K 4.8 02/10/2018 0405   CL 108 02/10/2018 0405   CO2 23 02/10/2018 0405   GLUCOSE 182 (H) 02/10/2018 0405   BUN 24 (H) 02/10/2018 0405   CREATININE 0.93 02/10/2018 0405   CREATININE 0.86 02/26/2016 1028   CALCIUM 8.7 (L) 02/10/2018 0405   GFRNONAA >60 02/10/2018 0405   GFRAA >60 02/10/2018 0405    INR    Component Value Date/Time   INR 1.06 01/31/2018 0936     Intake/Output Summary (Last 24 hours) at 02/10/2018 1104 Last data filed at 02/10/2018 6144 Gross per 24 hour  Intake 2074.21 ml  Output 1100 ml  Net 974.21 ml     Assessment/Plan:  72 y.o. male is s/p L CFA, SFA, profunda endarterectomy with revision ABF L limb 1 Day Post-Op   Perfusing LLE well with brisk PTA by doppler Encouraged keeping dry gauze in L groin If ambulating without difficulty and pain controlled, ok to discharge home this morning Follow up in office in 2 weeks; office will arrange  Dagoberto Ligas, PA-C [facility] [phone] 02/10/2018 11:04 AM  Agree with above.   Diastolic BP was a little low at 45 but asymptomatic D/c home today  Ruta Hinds, MD [facility] [place] Office: [phone] Pager: [phone]

## 2018-02-10 NOTE — Progress Notes (Signed)
Patient is still uncomfortable walking and with his blood pressure. He said he feels very flushed when he walks and would feel better staying one more night for observation. Patient at home alone. He has friends that can come and check on him but no one in the home with him.

## 2018-02-11 LAB — GLUCOSE, CAPILLARY: Glucose-Capillary: 129 mg/dL — ABNORMAL HIGH (ref 70–99)

## 2018-02-11 NOTE — Progress Notes (Signed)
Patient is ready for discharge he has all of his belongings. He is alert an oriented. His discharge has been discussed and his questions regarding discharge and medications have been answered. Patient has been removed from telemetry and IV removed without complication, catheter intact. Patient will be transported home by his friend. He will leave the unit by wheelchair and meet his friend at the front entrance of the hospital.

## 2018-02-11 NOTE — Progress Notes (Signed)
[facility] [place]  Subjective  - less sore today   Objective (!) 166/73 (!) 53 98.8 F (37.1 C) (Oral) 17 94%  Intake/Output Summary (Last 24 hours) at 02/11/2018 0958 Last data filed at 02/11/2018 0559 Gross per 24 hour  Intake 604.9 ml  Output 3100 ml  Net -2495.1 ml   Left groin incision healing foot well perfused  Assessment/Planning: S/p left femoral revision D/c home today  Kenneth Lee 02/11/2018 9:58 AM --  Laboratory Lab Results: Recent Labs    02/09/18 1408 02/10/18 0405  WBC 12.9* 13.6*  HGB 12.8* 11.7*  HCT 39.8 36.1*  PLT 190 168   BMET Recent Labs    02/09/18 1408 02/10/18 0405  NA  --  139  K  --  4.8  CL  --  108  CO2  --  23  GLUCOSE  --  182*  BUN  --  24*  CREATININE 1.05 0.93  CALCIUM  --  8.7*    COAG Lab Results  Component Value Date   INR 1.06 01/31/2018   INR 1.17 05/08/2015   INR 1.0 03/28/2007   No results found for: PTT

## 2018-02-11 NOTE — Evaluation (Signed)
Physical Therapy Evaluation Patient Details Name: Kenneth Lee MRN: [id number] DOB: 02-05-1946 Today's Date: 02/11/2018   History of Present Illness  Pt is a 72 y/o male s/p endarterectomy of common femoral artery, deep femoral artery, and superficial femoral artery with revision of left limb of aortofemoral bypass graft with vein patch angioplasty using left great saphenous vein. PHMx of CAD, HTN, DM, L2-3/L3-5 fusion  Clinical Impression  Pt BP much improved allowing pt to increase amb tolerance and complete stair negotiation without sitting/rest break. Pt with improved mobility and is safe to dc home with use of RW when medically stable. Acute PT to con't to follow.    Follow Up Recommendations No PT follow up    Equipment Recommendations  None recommended by PT    Recommendations for Other Services       Precautions / Restrictions Precautions Precautions: Fall Restrictions Weight Bearing Restrictions: No      Mobility  Bed Mobility               General bed mobility comments: pt up in chair upon PT arrival  Transfers Overall transfer level: Modified independent Equipment used: None Transfers: Sit to/from Stand Sit to Stand: Supervision         General transfer comment: pushed up from arm rest of chair, no instability during transition of hands to walker  Ambulation/Gait Ambulation/Gait assistance: Supervision Gait Distance (Feet): 175 Feet Assistive device: Rolling walker (2 wheeled) Gait Pattern/deviations: Step-through pattern;Decreased stride length;Antalgic Gait velocity: decreased   General Gait Details: mild L LE antalgia, c/o L goin tightness due to swelling. Pt reports "this is the furtherest Ive walked without stopping in a very long time. " Pt encouraged to decreased UE Wbing however pt states "my back bothers me alot so i brace myself"    Stairs Stairs: Yes Stairs assistance: Min guard Stair Management: One rail Left;Step to  pattern;Sideways Number of Stairs: 5(to mimic home set up) General stair comments: pt with report "this is so much easier"  Wheelchair Mobility    Modified Rankin (Stroke Patients Only)       Balance Overall balance assessment: Needs assistance Sitting-balance support: Feet supported Sitting balance-Leahy Scale: Good     Standing balance support: No upper extremity supported Standing balance-Leahy Scale: Fair Standing balance comment: requires RW for                              Pertinent Vitals/Pain Pain Assessment: 0-10 Pain Score: 5  Pain Location: LLE/groing Pain Descriptors / Indicators: Sore Pain Intervention(s): Monitored during session    Home Living                        Prior Function                 Hand Dominance        Extremity/Trunk Assessment                Communication      Cognition Arousal/Alertness: Awake/alert Behavior During Therapy: WFL for tasks assessed/performed Overall Cognitive Status: Within Functional Limits for tasks assessed                                 General Comments: slightly anxious to get out of hospital      General Comments General comments (skin integrity, edema, etc.): Pt  with edema in L side of groin otherwise in tact    Exercises     Assessment/Plan    PT Assessment    PT Problem List         PT Treatment Interventions      PT Goals (Current goals can be found in the Care Plan section)  Acute Rehab PT Goals Patient Stated Goal: go home asap    Frequency Min 3X/week   Barriers to discharge        Co-evaluation               AM-PAC PT "6 Clicks" Daily Activity  Outcome Measure Difficulty turning over in bed (including adjusting bedclothes, sheets and blankets)?: None Difficulty moving from lying on back to sitting on the side of the bed? : None Difficulty sitting down on and standing up from a chair with arms (e.g., wheelchair, bedside  commode, etc,.)?: None Help needed moving to and from a bed to chair (including a wheelchair)?: None Help needed walking in hospital room?: A Little Help needed climbing 3-5 steps with a railing? : A Little 6 Click Score: 22    End of Session Equipment Utilized During Treatment: Gait belt Activity Tolerance: Patient tolerated treatment well Patient left: in chair;with call bell/phone within reach;Other (comment) Nurse Communication: Mobility status;Other (comment) PT Visit Diagnosis: Other abnormalities of gait and mobility (R26.89)    Time: 2725-3664 PT Time Calculation (min) (ACUTE ONLY): 12 min   Charges:     PT Treatments $Gait Training: 8-22 mins        Kittie Plater, PT, DPT Pager #: [phone] Office #: [phone]   [place] 02/11/2018, 9:30 AM

## 2018-02-13 DIAGNOSIS — E1151 Type 2 diabetes mellitus with diabetic peripheral angiopathy without gangrene: Secondary | ICD-10-CM | POA: Diagnosis not present

## 2018-02-13 DIAGNOSIS — E1140 Type 2 diabetes mellitus with diabetic neuropathy, unspecified: Secondary | ICD-10-CM | POA: Diagnosis not present

## 2018-02-13 DIAGNOSIS — I6523 Occlusion and stenosis of bilateral carotid arteries: Secondary | ICD-10-CM | POA: Diagnosis not present

## 2018-02-13 DIAGNOSIS — Z48812 Encounter for surgical aftercare following surgery on the circulatory system: Secondary | ICD-10-CM | POA: Diagnosis not present

## 2018-02-13 DIAGNOSIS — I75022 Atheroembolism of left lower extremity: Secondary | ICD-10-CM | POA: Diagnosis not present

## 2018-02-13 DIAGNOSIS — I2510 Atherosclerotic heart disease of native coronary artery without angina pectoris: Secondary | ICD-10-CM | POA: Diagnosis not present

## 2018-02-13 NOTE — Discharge Summary (Signed)
Physician Discharge Summary   Patient ID: Kenneth Lee [id number] 72 y.o. 08/29/1945  Admit date: 02/09/2018  Discharge date and time: 02/11/2018 12:10 PM   Admitting Physician: Angelia Mould, MD   Discharge Physician: Dr. 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 [name] on 02/09/2018 due to threatening outflow stenosis.  He tolerated the procedure well and was admitted to the hospital postoperatively.  POD #1 patient was tolerating a regular diet, postoperative pain was controlled, and patient was feeling fit for discharge home.  He was asked to walk up and down the halls to make sure he was comfortable on his feet.  When doing so patient felt flushed and felt like he wanted to sit down.  He preferred to stay an additional night.  POD #2 patient was ambulating without difficulty and was ready for discharge home.  PT and OT were involved in evaluation and treatment of patient and had no recommendations for ongoing treatment.  Postoperatively patient maintained a brisk left posterior tibial artery Doppler signal.  He will be discharged with 2 to 3 days of narcotic pain medication for continued postoperative pain control.  He will follow-up with Dr. [name] in 2 weeks.  Discharge instructions reviewed with the patient and he voices understanding.  He will be discharged in stable condition.  Consults: None  Treatments: L CFA, profunda, and SFA endarterectomy with revision of aortofemoral limb with vein patch angioplasty  Discharge Exam: see progress note  02/11/18 Vitals:   02/11/18 0547 02/11/18 0829  BP: (!) 156/71 (!) 166/73  Pulse:  (!) 53  Resp: 13 17  Temp: 98.8 F (37.1 C)   SpO2: 95% 94%     Disposition: home  Patient Instructions:  Allergies as of 02/11/2018      Reactions   Penicillins Swelling   PATIENT HAS HAD A PCN REACTION WITH IMMEDIATE RASH, FACIAL/TONGUE/THROAT SWELLING, SOB, OR LIGHTHEADEDNESS WITH HYPOTENSION:  #  #  YES  #  #  Has patient had a PCN reaction causing severe rash involving mucus membranes or skin necrosis: No Has patient had a PCN reaction that required hospitalization No Has patient had a PCN reaction occurring within the last 10 years: No      Medication List    TAKE these medications   amLODipine 5 MG tablet Commonly known as:  NORVASC TAKE 1 TABLET BY MOUTH EVERY DAY   aspirin 325 MG tablet Take 325 mg by mouth daily.   azithromycin 500 MG tablet Commonly known as:  ZITHROMAX Take by mouth daily. Taking Z-pack   CLEAR EYES OP Place 1 drop into both eyes daily.   Fish Oil 1000 MG Caps Take 1,000 mg by mouth daily.   fluticasone 50 MCG/ACT nasal spray Commonly known as:  FLONASE Place 2 sprays into both nostrils at bedtime.   furosemide 40 MG tablet Commonly known as:  LASIX Take 40 mg by mouth daily.   hydrALAZINE 50 MG tablet Commonly known as:  APRESOLINE Take 1 tablet (50 mg total) by mouth 3 (three) times daily.   lisinopril 20 MG tablet Commonly known as:  PRINIVIL,ZESTRIL  TAKE 1 TABLET BY MOUTH 2 TIMES DAILY. PLEASE KEEP YOUR UPCOMING APPT FOR FUTURE REFILLS.   meloxicam 15 MG tablet Commonly known as:  MOBIC Take 15 mg by mouth daily.   metFORMIN 500 MG tablet Commonly known as:  GLUCOPHAGE Take 500 mg by mouth 2 (two) times daily with a meal.   multivitamin with minerals Tabs tablet Take 1 tablet by mouth daily.   nebivolol 5 MG tablet Commonly known as:  BYSTOLIC Take 1 tablet (5 mg total) by mouth daily.   nitroGLYCERIN 0.4 MG SL tablet Commonly  known as:  NITROSTAT Place 0.4 mg under the tongue every 5 (five) minutes as needed for chest pain.   oxyCODONE-acetaminophen 5-325 MG tablet Commonly known as:  PERCOCET/ROXICET Take 1 tablet by mouth every 6 (six) hours as needed for moderate pain.   rosuvastatin 10 MG tablet Commonly known as:  CRESTOR Take 1 tablet (10 mg total) by mouth daily.   spironolactone 25 MG tablet Commonly known as:  ALDACTONE Take 1 tablet (25 mg total) by mouth daily.      Activity: activity as tolerated Diet: regular diet Wound Care: keep wound clean and dry  Follow-up with Dr. [name] in 2 weeks.  SignedDagoberto Ligas 02/13/2018 5:43 PM

## 2018-02-14 ENCOUNTER — Telehealth: Payer: Self-pay | Admitting: Vascular Surgery

## 2018-02-14 NOTE — Telephone Encounter (Signed)
Sched. Appt. Spoke with pt. 02/26/18 mon. 9am p/o MD LC

## 2018-02-15 DIAGNOSIS — E1151 Type 2 diabetes mellitus with diabetic peripheral angiopathy without gangrene: Secondary | ICD-10-CM | POA: Diagnosis not present

## 2018-02-15 DIAGNOSIS — I6523 Occlusion and stenosis of bilateral carotid arteries: Secondary | ICD-10-CM | POA: Diagnosis not present

## 2018-02-15 DIAGNOSIS — E1140 Type 2 diabetes mellitus with diabetic neuropathy, unspecified: Secondary | ICD-10-CM | POA: Diagnosis not present

## 2018-02-15 DIAGNOSIS — I75022 Atheroembolism of left lower extremity: Secondary | ICD-10-CM | POA: Diagnosis not present

## 2018-02-15 DIAGNOSIS — Z48812 Encounter for surgical aftercare following surgery on the circulatory system: Secondary | ICD-10-CM | POA: Diagnosis not present

## 2018-02-15 DIAGNOSIS — I2510 Atherosclerotic heart disease of native coronary artery without angina pectoris: Secondary | ICD-10-CM | POA: Diagnosis not present

## 2018-02-17 DIAGNOSIS — E1140 Type 2 diabetes mellitus with diabetic neuropathy, unspecified: Secondary | ICD-10-CM | POA: Diagnosis not present

## 2018-02-17 DIAGNOSIS — Z48812 Encounter for surgical aftercare following surgery on the circulatory system: Secondary | ICD-10-CM | POA: Diagnosis not present

## 2018-02-17 DIAGNOSIS — I2510 Atherosclerotic heart disease of native coronary artery without angina pectoris: Secondary | ICD-10-CM | POA: Diagnosis not present

## 2018-02-17 DIAGNOSIS — I6523 Occlusion and stenosis of bilateral carotid arteries: Secondary | ICD-10-CM | POA: Diagnosis not present

## 2018-02-17 DIAGNOSIS — I75022 Atheroembolism of left lower extremity: Secondary | ICD-10-CM | POA: Diagnosis not present

## 2018-02-17 DIAGNOSIS — E1151 Type 2 diabetes mellitus with diabetic peripheral angiopathy without gangrene: Secondary | ICD-10-CM | POA: Diagnosis not present

## 2018-02-20 DIAGNOSIS — E1165 Type 2 diabetes mellitus with hyperglycemia: Secondary | ICD-10-CM | POA: Diagnosis not present

## 2018-02-20 DIAGNOSIS — G629 Polyneuropathy, unspecified: Secondary | ICD-10-CM | POA: Diagnosis not present

## 2018-02-20 DIAGNOSIS — Z7984 Long term (current) use of oral hypoglycemic drugs: Secondary | ICD-10-CM | POA: Diagnosis not present

## 2018-02-20 DIAGNOSIS — G4733 Obstructive sleep apnea (adult) (pediatric): Secondary | ICD-10-CM | POA: Diagnosis not present

## 2018-02-20 DIAGNOSIS — E7800 Pure hypercholesterolemia, unspecified: Secondary | ICD-10-CM | POA: Diagnosis not present

## 2018-02-20 DIAGNOSIS — I739 Peripheral vascular disease, unspecified: Secondary | ICD-10-CM | POA: Diagnosis not present

## 2018-02-20 DIAGNOSIS — I2510 Atherosclerotic heart disease of native coronary artery without angina pectoris: Secondary | ICD-10-CM | POA: Diagnosis not present

## 2018-02-21 DIAGNOSIS — E1151 Type 2 diabetes mellitus with diabetic peripheral angiopathy without gangrene: Secondary | ICD-10-CM | POA: Diagnosis not present

## 2018-02-21 DIAGNOSIS — E1140 Type 2 diabetes mellitus with diabetic neuropathy, unspecified: Secondary | ICD-10-CM | POA: Diagnosis not present

## 2018-02-21 DIAGNOSIS — I6523 Occlusion and stenosis of bilateral carotid arteries: Secondary | ICD-10-CM | POA: Diagnosis not present

## 2018-02-21 DIAGNOSIS — Z48812 Encounter for surgical aftercare following surgery on the circulatory system: Secondary | ICD-10-CM | POA: Diagnosis not present

## 2018-02-21 DIAGNOSIS — I2510 Atherosclerotic heart disease of native coronary artery without angina pectoris: Secondary | ICD-10-CM | POA: Diagnosis not present

## 2018-02-21 DIAGNOSIS — I75022 Atheroembolism of left lower extremity: Secondary | ICD-10-CM | POA: Diagnosis not present

## 2018-02-23 DIAGNOSIS — Z48812 Encounter for surgical aftercare following surgery on the circulatory system: Secondary | ICD-10-CM | POA: Diagnosis not present

## 2018-02-23 DIAGNOSIS — I75022 Atheroembolism of left lower extremity: Secondary | ICD-10-CM | POA: Diagnosis not present

## 2018-02-23 DIAGNOSIS — I2510 Atherosclerotic heart disease of native coronary artery without angina pectoris: Secondary | ICD-10-CM | POA: Diagnosis not present

## 2018-02-23 DIAGNOSIS — I6523 Occlusion and stenosis of bilateral carotid arteries: Secondary | ICD-10-CM | POA: Diagnosis not present

## 2018-02-23 DIAGNOSIS — E1140 Type 2 diabetes mellitus with diabetic neuropathy, unspecified: Secondary | ICD-10-CM | POA: Diagnosis not present

## 2018-02-23 DIAGNOSIS — E1151 Type 2 diabetes mellitus with diabetic peripheral angiopathy without gangrene: Secondary | ICD-10-CM | POA: Diagnosis not present

## 2018-02-26 ENCOUNTER — Other Ambulatory Visit: Payer: Self-pay

## 2018-02-26 ENCOUNTER — Ambulatory Visit (INDEPENDENT_AMBULATORY_CARE_PROVIDER_SITE_OTHER): Payer: Self-pay | Admitting: Vascular Surgery

## 2018-02-26 ENCOUNTER — Encounter: Payer: Self-pay | Admitting: Vascular Surgery

## 2018-02-26 VITALS — BP 133/62 | HR 50 | Temp 97.8°F | Ht 71.0 in | Wt 268.0 lb

## 2018-02-26 DIAGNOSIS — Z48812 Encounter for surgical aftercare following surgery on the circulatory system: Secondary | ICD-10-CM

## 2018-02-26 NOTE — Progress Notes (Signed)
Patient name: Kenneth Lee MRN: [id number] DOB: 1946-05-11 Sex: male  REASON FOR VISIT:   Follow-up after endarterectomy of the left common femoral artery, deep femoral artery, and superficial femoral artery with revision of left limb of aortofemoral bypass graft using vein patch angioplasty with left great saphenous vein.  HPI:   Kenneth Lee is a pleasant 72 y.o. male who underwent a aortobifemoral bypass graft in 1996.  He presented with significant narrowing of the distal anastomosis and I felt that he was at risk for thrombosis of the left limb of his graft.  For this reason I recommended revision.  He underwent the procedure on 02/09/2018 and had extensive calcific plaque.  Since I saw him last he has been doing well.  He has no specific complaints except for some claudication in the left calf which is chronic.  He has a known SFA occlusion.  Is also had some mild leg swelling.  He denies fever or chills.   Current Outpatient Medications  Medication Sig Dispense Refill  . amLODipine (NORVASC) 5 MG tablet TAKE 1 TABLET BY MOUTH EVERY DAY 30 tablet 11  . aspirin 325 MG tablet Take 325 mg by mouth daily.    Marland Kitchen azithromycin (ZITHROMAX) 500 MG tablet Take by mouth daily. Taking Z-pack    . fluticasone (FLONASE) 50 MCG/ACT nasal spray Place 2 sprays into both nostrils at bedtime.   5  . furosemide (LASIX) 40 MG tablet Take 40 mg by mouth daily.    . hydrALAZINE (APRESOLINE) 50 MG tablet Take 1 tablet (50 mg total) by mouth 3 (three) times daily. 270 tablet 3  . lisinopril (PRINIVIL,ZESTRIL) 20 MG tablet TAKE 1 TABLET BY MOUTH 2 TIMES DAILY. PLEASE KEEP YOUR UPCOMING APPT FOR FUTURE REFILLS. 180 tablet 0  . meloxicam (MOBIC) 15 MG tablet Take 15 mg by mouth daily.  7  . metFORMIN (GLUCOPHAGE) 500 MG tablet Take 500 mg by mouth 2 (two) times daily with a meal.     . Multiple Vitamin (MULTIVITAMIN WITH MINERALS) TABS tablet Take 1 tablet by mouth daily.    . Naphazoline HCl (CLEAR EYES  OP) Place 1 drop into both eyes daily.    . nebivolol (BYSTOLIC) 5 MG tablet Take 1 tablet (5 mg total) by mouth daily. 30 tablet 11  . nitroGLYCERIN (NITROSTAT) 0.4 MG SL tablet Place 0.4 mg under the tongue every 5 (five) minutes as needed for chest pain.    . Omega-3 Fatty Acids (FISH OIL) 1000 MG CAPS Take 1,000 mg by mouth daily.     Marland Kitchen oxyCODONE-acetaminophen (PERCOCET/ROXICET) 5-325 MG tablet Take 1 tablet by mouth every 6 (six) hours as needed for moderate pain. 20 tablet 0  . rosuvastatin (CRESTOR) 10 MG tablet Take 1 tablet (10 mg total) by mouth daily. 90 tablet 3  . spironolactone (ALDACTONE) 25 MG tablet Take 1 tablet (25 mg total) by mouth daily. 90 tablet 4   No current facility-administered medications for this visit.     REVIEW OF SYSTEMS:  [X]  denotes positive finding, [ ]  denotes negative finding Vascular    Leg swelling x   Cardiac    Chest pain or chest pressure:    Shortness of breath upon exertion:    Short of breath when lying flat:    Irregular heart rhythm:    Constitutional    Fever or chills:     PHYSICAL EXAM:   There were no vitals filed for this visit.  GENERAL: The patient is  a well-nourished male, in no acute distress. The vital signs are documented above. CARDIOVASCULAR: There is a regular rate and rhythm. PULMONARY: There is good air exchange bilaterally without wheezing or rales. VASCULAR: He has a monophasic posterior tibial, dorsalis pedis, and peroneal signal with the Doppler.  He has mild left lower extremity swelling.  DATA:   No new data  MEDICAL ISSUES:   STATUS POST REVISION LEFT LIMB AORTOFEMORAL BYPASS GRAFT: Patient is doing well status post endarterectomy of the common femoral artery, deep femoral artery, and superficial femoral artery and revision of the left limb of his aortofemoral bypass graft with vein patch angioplasty using left great saphenous vein.  This was done because of his risk for graft thrombosis given a tight  outflow stenosis.  Patient is doing well postoperatively.  I encouraged him to ambulate as much as possible.  He has a known superficial femoral artery occlusion.  Fortunately he quit smoking.  We have also discussed the importance of nutrition.  We also discussed the importance of leg elevation for his leg swelling.  I will see him back in 6 months.  I ordered follow-up ABIs at that time.  He knows to call sooner if he has problems.  [name] [facility] [phone]

## 2018-02-28 DIAGNOSIS — I6523 Occlusion and stenosis of bilateral carotid arteries: Secondary | ICD-10-CM | POA: Diagnosis not present

## 2018-02-28 DIAGNOSIS — E1140 Type 2 diabetes mellitus with diabetic neuropathy, unspecified: Secondary | ICD-10-CM | POA: Diagnosis not present

## 2018-02-28 DIAGNOSIS — I75022 Atheroembolism of left lower extremity: Secondary | ICD-10-CM | POA: Diagnosis not present

## 2018-02-28 DIAGNOSIS — E1151 Type 2 diabetes mellitus with diabetic peripheral angiopathy without gangrene: Secondary | ICD-10-CM | POA: Diagnosis not present

## 2018-02-28 DIAGNOSIS — Z48812 Encounter for surgical aftercare following surgery on the circulatory system: Secondary | ICD-10-CM | POA: Diagnosis not present

## 2018-02-28 DIAGNOSIS — I2510 Atherosclerotic heart disease of native coronary artery without angina pectoris: Secondary | ICD-10-CM | POA: Diagnosis not present

## 2018-03-21 ENCOUNTER — Other Ambulatory Visit: Payer: Self-pay | Admitting: Interventional Cardiology

## 2018-03-26 DIAGNOSIS — Z961 Presence of intraocular lens: Secondary | ICD-10-CM | POA: Diagnosis not present

## 2018-03-26 DIAGNOSIS — H2512 Age-related nuclear cataract, left eye: Secondary | ICD-10-CM | POA: Diagnosis not present

## 2018-03-26 DIAGNOSIS — H5202 Hypermetropia, left eye: Secondary | ICD-10-CM | POA: Diagnosis not present

## 2018-03-26 DIAGNOSIS — H524 Presbyopia: Secondary | ICD-10-CM | POA: Diagnosis not present

## 2018-05-10 ENCOUNTER — Other Ambulatory Visit: Payer: Self-pay | Admitting: Interventional Cardiology

## 2018-05-22 DIAGNOSIS — G4730 Sleep apnea, unspecified: Secondary | ICD-10-CM | POA: Diagnosis not present

## 2018-05-22 DIAGNOSIS — R0981 Nasal congestion: Secondary | ICD-10-CM | POA: Diagnosis not present

## 2018-05-22 DIAGNOSIS — Z125 Encounter for screening for malignant neoplasm of prostate: Secondary | ICD-10-CM | POA: Diagnosis not present

## 2018-05-22 DIAGNOSIS — E1151 Type 2 diabetes mellitus with diabetic peripheral angiopathy without gangrene: Secondary | ICD-10-CM | POA: Diagnosis not present

## 2018-05-22 DIAGNOSIS — I739 Peripheral vascular disease, unspecified: Secondary | ICD-10-CM | POA: Diagnosis not present

## 2018-05-22 DIAGNOSIS — H9209 Otalgia, unspecified ear: Secondary | ICD-10-CM | POA: Diagnosis not present

## 2018-05-22 DIAGNOSIS — I2510 Atherosclerotic heart disease of native coronary artery without angina pectoris: Secondary | ICD-10-CM | POA: Diagnosis not present

## 2018-05-22 DIAGNOSIS — Z23 Encounter for immunization: Secondary | ICD-10-CM | POA: Diagnosis not present

## 2018-05-22 DIAGNOSIS — Z0000 Encounter for general adult medical examination without abnormal findings: Secondary | ICD-10-CM | POA: Diagnosis not present

## 2018-05-22 DIAGNOSIS — Z1389 Encounter for screening for other disorder: Secondary | ICD-10-CM | POA: Diagnosis not present

## 2018-05-22 DIAGNOSIS — I10 Essential (primary) hypertension: Secondary | ICD-10-CM | POA: Diagnosis not present

## 2018-05-24 DIAGNOSIS — H6522 Chronic serous otitis media, left ear: Secondary | ICD-10-CM | POA: Diagnosis not present

## 2018-06-15 DIAGNOSIS — H906 Mixed conductive and sensorineural hearing loss, bilateral: Secondary | ICD-10-CM | POA: Diagnosis not present

## 2018-06-15 DIAGNOSIS — H6522 Chronic serous otitis media, left ear: Secondary | ICD-10-CM | POA: Diagnosis not present

## 2018-07-13 ENCOUNTER — Other Ambulatory Visit: Payer: Self-pay | Admitting: *Deleted

## 2018-07-13 DIAGNOSIS — I739 Peripheral vascular disease, unspecified: Secondary | ICD-10-CM

## 2018-07-20 DIAGNOSIS — H6522 Chronic serous otitis media, left ear: Secondary | ICD-10-CM | POA: Diagnosis not present

## 2018-08-17 DIAGNOSIS — D225 Melanocytic nevi of trunk: Secondary | ICD-10-CM | POA: Diagnosis not present

## 2018-08-17 DIAGNOSIS — L812 Freckles: Secondary | ICD-10-CM | POA: Diagnosis not present

## 2018-08-17 DIAGNOSIS — L821 Other seborrheic keratosis: Secondary | ICD-10-CM | POA: Diagnosis not present

## 2018-08-17 DIAGNOSIS — L570 Actinic keratosis: Secondary | ICD-10-CM | POA: Diagnosis not present

## 2018-08-17 DIAGNOSIS — L720 Epidermal cyst: Secondary | ICD-10-CM | POA: Diagnosis not present

## 2018-09-05 ENCOUNTER — Encounter: Payer: Self-pay | Admitting: Vascular Surgery

## 2018-09-05 ENCOUNTER — Ambulatory Visit (HOSPITAL_COMMUNITY)
Admission: RE | Admit: 2018-09-05 | Discharge: 2018-09-05 | Disposition: A | Discharge status: Home / Self Care | Payer: PPO | Source: Ambulatory Visit | Attending: Vascular Surgery | Admitting: Vascular Surgery

## 2018-09-05 ENCOUNTER — Other Ambulatory Visit: Payer: Self-pay

## 2018-09-05 ENCOUNTER — Encounter (HOSPITAL_COMMUNITY): Payer: Medicare Other

## 2018-09-05 ENCOUNTER — Ambulatory Visit (INDEPENDENT_AMBULATORY_CARE_PROVIDER_SITE_OTHER): Payer: PPO | Admitting: Vascular Surgery

## 2018-09-05 ENCOUNTER — Ambulatory Visit: Payer: Medicare Other | Admitting: Vascular Surgery

## 2018-09-05 VITALS — BP 94/48 | HR 51 | Temp 97.2°F | Resp 18 | Ht 70.5 in | Wt 272.7 lb

## 2018-09-05 DIAGNOSIS — I739 Peripheral vascular disease, unspecified: Secondary | ICD-10-CM

## 2018-09-05 NOTE — Progress Notes (Signed)
Patient name: Kenneth Lee MRN: [id number] DOB: 24-Jun-1946 Sex: male  REASON FOR VISIT:   Follow-up of peripheral vascular disease.  HPI:   Kenneth Lee is a pleasant 73 y.o. male who I last saw on 02/26/2018.  He had undergone an aortobifemoral bypass graft in 1996.  He presented with narrowing at the distal anastomosis of the left limb and on 02/09/2018 underwent endarterectomy of the left common femoral artery, deep femoral artery, and superficial femoral artery with vein patch angioplasty.  His SFA is chronically occluded bilaterally.   Since I saw him last he denies any claudication, rest pain, or nonhealing ulcers.  He does have chronic back pain and has had multiple previous back operations.  He also describes some tightness in his left knee above and below the knee but does not feel that this is arthritis.  He quit smoking 2 years ago.  Past Medical History:  Diagnosis Date  . Arthritis   . Carotid disease, bilateral ([place])   . Chronic back pain    stenosis  . Coronary artery disease    with bare mental stent 2008 and RCA is Card   Dr.Smith  . Diabetes mellitus without complication ([place])    takes Metformin daily  . Headache    occasionally  . Heart murmur    as a teenager, no problems as an adult  . History of colon polyps    benign  . Hyperlipidemia    takes Crestor daily  . Hypertension    takes Amlodipine,Bystolic,Apresoline,and Lisinopril daily.   . Joint pain   . Other specified cardiac dysrhythmias(427.89)   . Peripheral edema    takes Lasix daily  . Peripheral neuropathy   . Sinoatrial node dysfunction (HCC)   . Sleep apnea    uses cpap  . Weakness    numbness and tingling,left leg    Family History  Problem Relation Age of Onset  . Heart disease Mother   . Cancer Mother   . Heart attack Mother   . Hypertension Mother   . Hypertension Father   . Hypertension Other   . Hyperlipidemia Other   . Stroke Neg Hx     SOCIAL HISTORY: Social  History   Tobacco Use  . Smoking status: Former Smoker    Last attempt to quit: 01/24/2018    Years since quitting: 0.6  . Smokeless tobacco: Never Used  Substance Use Topics  . Alcohol use: Yes    Comment: socially    Allergies  Allergen Reactions  . Penicillins Swelling    PATIENT HAS HAD A PCN REACTION WITH IMMEDIATE RASH, FACIAL/TONGUE/THROAT SWELLING, SOB, OR LIGHTHEADEDNESS WITH HYPOTENSION:  #  #  YES  #  #  Has patient had a PCN reaction causing severe rash involving mucus membranes or skin necrosis: No Has patient had a PCN reaction that required hospitalization No Has patient had a PCN reaction occurring within the last 10 years: No    Current Outpatient Medications  Medication Sig Dispense Refill  . amLODipine (NORVASC) 5 MG tablet TAKE 1 TABLET BY MOUTH EVERY DAY 30 tablet 11  . aspirin 325 MG tablet Take 325 mg by mouth daily.    . fluticasone (FLONASE) 50 MCG/ACT nasal spray Place 2 sprays into both nostrils at bedtime.   5  . furosemide (LASIX) 40 MG tablet Take 1 tablet (40 mg total) by mouth daily. 90 tablet 3  . gabapentin (NEURONTIN) 100 MG capsule TAKE 1 CAPSULE BY MOUTH THREE TIMES  A DAY    . hydrALAZINE (APRESOLINE) 50 MG tablet Take 1 tablet (50 mg total) by mouth 3 (three) times daily. 270 tablet 3  . lisinopril (PRINIVIL,ZESTRIL) 20 MG tablet TAKE 1 TABLET BY MOUTH 2 TIMES DAILY. PLEASE KEEP YOUR UPCOMING APPT FOR FUTURE REFILLS. 180 tablet 2  . meloxicam (MOBIC) 15 MG tablet Take 15 mg by mouth daily.  7  . metFORMIN (GLUCOPHAGE) 500 MG tablet Take 500 mg by mouth 2 (two) times daily with a meal.     . Multiple Vitamin (MULTIVITAMIN WITH MINERALS) TABS tablet Take 1 tablet by mouth daily.    . Naphazoline HCl (CLEAR EYES OP) Place 1 drop into both eyes daily.    . nebivolol (BYSTOLIC) 5 MG tablet Take 1 tablet (5 mg total) by mouth daily. 30 tablet 11  . nitroGLYCERIN (NITROSTAT) 0.4 MG SL tablet Place 0.4 mg under the tongue every 5 (five) minutes as  needed for chest pain.    . Omega-3 Fatty Acids (FISH OIL) 1000 MG CAPS Take 1,000 mg by mouth daily.     . rosuvastatin (CRESTOR) 10 MG tablet Take 1 tablet (10 mg total) by mouth daily. 90 tablet 3  . spironolactone (ALDACTONE) 25 MG tablet Take 1 tablet (25 mg total) by mouth daily. 90 tablet 4  . azithromycin (ZITHROMAX) 250 MG tablet TAKE 2 TABLETS BY MOUTH TODAY, THEN TAKE 1 TABLET DAILY FOR 4 DAYS     No current facility-administered medications for this visit.     REVIEW OF SYSTEMS:  [X]  denotes positive finding, [ ]  denotes negative finding Cardiac  Comments:  Chest pain or chest pressure:    Shortness of breath upon exertion:    Short of breath when lying flat:    Irregular heart rhythm:        Vascular    Pain in calf, thigh, or hip brought on by ambulation:    Pain in feet at night that wakes you up from your sleep:     Blood clot in your veins:    Leg swelling:  x       Pulmonary    Oxygen at home:    Productive cough:     Wheezing:         Neurologic    Sudden weakness in arms or legs:     Sudden numbness in arms or legs:     Sudden onset of difficulty speaking or slurred speech:    Temporary loss of vision in one eye:     Problems with dizziness:         Gastrointestinal    Blood in stool:     Vomited blood:         Genitourinary    Burning when urinating:     Blood in urine:        Psychiatric    Major depression:         Hematologic    Bleeding problems:    Problems with blood clotting too easily:        Skin    Rashes or ulcers:        Constitutional    Fever or chills:     PHYSICAL EXAM:   Vitals:   09/05/18 0832  BP: (!) 94/48  Pulse: (!) 51  Resp: 18  Temp: (!) 97.2 F (36.2 C)  TempSrc: Oral  SpO2: 95%  Weight: 272 lb 11.3 oz (123.7 kg)  Height: 5' 10.5" (1.791 m)    GENERAL: The  patient is a well-nourished male, in no acute distress. The vital signs are documented above. CARDIAC: There is a regular rate and rhythm.    VASCULAR: I do not detect carotid bruits. He has palpable femoral pulses. I cannot palpate pedal pulses. PULMONARY: There is good air exchange bilaterally without wheezing or rales. ABDOMEN: Soft and non-tender with normal pitched bowel sounds.  MUSCULOSKELETAL: There are no major deformities or cyanosis. NEUROLOGIC: No focal weakness or paresthesias are detected. SKIN: There are no ulcers or rashes noted. PSYCHIATRIC: The patient has a normal affect.  DATA:    ARTERIAL DUPLEX: I have independently interpreted his arterial duplex scan today.  On the left side, the side where he had surgery, he has a monophasic dorsalis pedis and posterior tibial signal with an ABI of 54%.  On the right side he has a monophasic dorsalis pedis and posterior tibial signal with an ABI of 80%.  MEDICAL ISSUES:   PERIPHERAL VASCULAR DISEASE: This patient underwent an aortobifemoral bypass graft in 1996.  He developed a stenosis in the left groin which I addressed on 02/09/2018.  He has infrainguinal arterial occlusive disease bilaterally.  He is not a smoker.  He is on aspirin and a statin.  I have ordered follow-up ABIs eyes in 9 months and I will see him back at that time.  LEFT KNEE PAIN: The patient describes pain above and below his knee that occurs with standing.  I am not sure if this represents arthritis.  If this persists I think he could potentially benefit from orthopedic evaluation.  I reassured him that I did not think this was related to peripheral vascular disease.  [name] [facility] [phone]

## 2018-09-06 ENCOUNTER — Encounter (HOSPITAL_COMMUNITY): Payer: Medicare Other

## 2018-09-06 ENCOUNTER — Ambulatory Visit: Payer: Medicare Other | Admitting: Vascular Surgery

## 2018-09-08 DIAGNOSIS — J029 Acute pharyngitis, unspecified: Secondary | ICD-10-CM | POA: Diagnosis not present

## 2018-09-08 DIAGNOSIS — R0981 Nasal congestion: Secondary | ICD-10-CM | POA: Diagnosis not present

## 2018-09-08 DIAGNOSIS — R05 Cough: Secondary | ICD-10-CM | POA: Diagnosis not present

## 2018-09-08 DIAGNOSIS — R067 Sneezing: Secondary | ICD-10-CM | POA: Diagnosis not present

## 2018-10-22 DIAGNOSIS — G4733 Obstructive sleep apnea (adult) (pediatric): Secondary | ICD-10-CM | POA: Diagnosis not present

## 2018-10-31 ENCOUNTER — Other Ambulatory Visit: Payer: Self-pay | Admitting: Interventional Cardiology

## 2018-11-09 ENCOUNTER — Telehealth: Payer: Self-pay | Admitting: Interventional Cardiology

## 2018-11-09 NOTE — Telephone Encounter (Signed)
Patient set up for MyChart?  Yes   Is patient using Smartphone/computer/tablet? yes  Did audio/video work?  Does patient need telephone visit? no  Best phone number to use? [phone]  Special Instructions? Patient will have bp, wt and medication list      Virtual Visit Pre-Appointment Phone Call  "(Name), I am calling you today to discuss your upcoming appointment. We are currently trying to limit exposure to the virus that causes COVID-19 by seeing patients at home rather than in the office."  1. "What is the BEST phone number to call the day of the visit?" - include this in appointment notes  2. Do you have or have access to (through a family member/friend) a smartphone with video capability that we can use for your visit?" a. If yes - list this number in appt notes as cell (if different from BEST phone #) and list the appointment type as a VIDEO visit in appointment notes b. If no - list the appointment type as a PHONE visit in appointment notes  3. Confirm consent - "In the setting of the current Covid19 crisis, you are scheduled for a (phone or video) visit with your provider on (date) at (time).  Just as we do with many in-office visits, in order for you to participate in this visit, we must obtain consent.  If you'd like, I can send this to your mychart (if signed up) or email for you to review.  Otherwise, I can obtain your verbal consent now.  All virtual visits are billed to your insurance company just like a normal visit would be.  By agreeing to a virtual visit, we'd like you to understand that the technology does not allow for your provider to perform an examination, and thus may limit your provider's ability to fully assess your condition. If your provider identifies any concerns that need to be evaluated in person, we will make arrangements to do so.  Finally, though the technology is pretty good, we cannot assure that it will always work on either your or our end, and  in the setting of a video visit, we may have to convert it to a phone-only visit.  In either situation, we cannot ensure that we have a secure connection.  Are you willing to proceed?" STAFF: Did the patient verbally acknowledge consent to telehealth visit? Document YES/NO here: yes  4. Advise patient to be prepared - "Two hours prior to your appointment, go ahead and check your blood pressure, pulse, oxygen saturation, and your weight (if you have the equipment to check those) and write them all down. When your visit starts, your provider will ask you for this information. If you have an Apple Watch or Kardia device, please plan to have heart rate information ready on the day of your appointment. Please have a pen and paper handy nearby the day of the visit as well."  5. Give patient instructions for MyChart download to smartphone OR Doximity/Doxy.me as below if video visit (depending on what platform provider is using)  6. Inform patient they will receive a phone call 15 minutes prior to their appointment time (may be from unknown caller ID) so they should be prepared to answer    TELEPHONE CALL NOTE  Kenneth Lee has been deemed a candidate for a follow-up tele-health visit to limit community exposure during the Covid-19 pandemic. I spoke with the patient via phone to ensure availability of phone/video source, confirm preferred email & phone number, and discuss  instructions and expectations.  I reminded Kenneth Lee to be prepared with any vital sign and/or heart rhythm information that could potentially be obtained via home monitoring, at the time of his visit. I reminded Kenneth Lee to expect a phone call prior to his visit.  Howie Ill 11/09/2018 10:39 AM   INSTRUCTIONS FOR DOWNLOADING THE MYCHART APP TO SMARTPHONE  - The patient must first make sure to have activated MyChart and know their login information - If Apple, go to [employer] and type in MyChart in the search  bar and download the app. If Android, ask patient to go to [employer] and type in [place] in the search bar and download the app. The app is free but as with any other app downloads, their phone may require them to verify saved payment information or Apple/Android password.  - The patient will need to then log into the app with their MyChart username and password, and select [facility] as their healthcare provider to link the account. When it is time for your visit, go to the MyChart app, find appointments, and click Begin Video Visit. Be sure to Select Allow for your device to access the Microphone and Camera for your visit. You will then be connected, and your provider will be with you shortly.  **If they have any issues connecting, or need assistance please contact MyChart service desk [phone])**  **If using a computer, in order to ensure the best quality for their visit they will need to use either of the following Internet Browsers: [employer], or Google Chrome**  IF USING DOXIMITY or DOXY.ME - The patient will receive a link just prior to their visit by text.     FULL LENGTH CONSENT FOR TELE-HEALTH VISIT   I hereby voluntarily request, consent and authorize [facility] and its employed or contracted physicians, physician assistants, nurse practitioners or other licensed health care professionals (the Practitioner), to provide me with telemedicine health care services (the Services") as deemed necessary by the treating Practitioner. I acknowledge and consent to receive the Services by the Practitioner via telemedicine. I understand that the telemedicine visit will involve communicating with the Practitioner through live audiovisual communication technology and the disclosure of certain medical information by electronic transmission. I acknowledge that I have been given the opportunity to request an in-person assessment or other available alternative prior to the  telemedicine visit and am voluntarily participating in the telemedicine visit.  I understand that I have the right to withhold or withdraw my consent to the use of telemedicine in the course of my care at any time, without affecting my right to future care or treatment, and that the Practitioner or I may terminate the telemedicine visit at any time. I understand that I have the right to inspect all information obtained and/or recorded in the course of the telemedicine visit and may receive copies of available information for a reasonable fee.  I understand that some of the potential risks of receiving the Services via telemedicine include:   Delay or interruption in medical evaluation due to technological equipment failure or disruption;  Information transmitted may not be sufficient (e.g. poor resolution of images) to allow for appropriate medical decision making by the Practitioner; and/or   In rare instances, security protocols could fail, causing a breach of personal health information.  Furthermore, I acknowledge that it is my responsibility to provide information about my medical history, conditions and care that is complete and accurate  to the best of my ability. I acknowledge that Practitioner's advice, recommendations, and/or decision may be based on factors not within their control, such as incomplete or inaccurate data provided by me or distortions of diagnostic images or specimens that may result from electronic transmissions. I understand that the practice of medicine is not an exact science and that Practitioner makes no warranties or guarantees regarding treatment outcomes. I acknowledge that I will receive a copy of this consent concurrently upon execution via email to the email address I last provided but may also request a printed copy by calling the office of [facility].    I understand that my insurance will be billed for this visit.   I have read or had this consent read to  me.  I understand the contents of this consent, which adequately explains the benefits and risks of the Services being provided via telemedicine.   I have been provided ample opportunity to ask questions regarding this consent and the Services and have had my questions answered to my satisfaction.  I give my informed consent for the services to be provided through the use of telemedicine in my medical care  By participating in this telemedicine visit I agree to the above.

## 2018-11-12 ENCOUNTER — Ambulatory Visit: Payer: Medicare Other | Admitting: Interventional Cardiology

## 2018-11-14 NOTE — Progress Notes (Signed)
Virtual Visit via Video Note   This visit type was conducted due to national recommendations for restrictions regarding the COVID-19 Pandemic (e.g. social distancing) in an effort to limit this patient's exposure and mitigate transmission in our community.  Due to his co-morbid illnesses, this patient is at least at moderate risk for complications without adequate follow up.  This format is felt to be most appropriate for this patient at this time.  All issues noted in this document were discussed and addressed.  A limited physical exam was performed with this format.  Please refer to the patient's chart for his consent to telehealth for [facility].   Date:  11/14/2018   ID:  Kenneth Lee, DOB [birth year], MRN [id number]  Patient Location: Home Provider Location: Office  PCP:  Kenneth Carol, MD  Cardiologist:  No primary care provider on file.  Electrophysiologist:  None   Evaluation Performed:  Follow-Up Visit  Chief Complaint:  CAD/PAd  History of Present Illness:    Kenneth Lee is a 73 y.o. male with CAD with her mental stent RCA 2008, bilateral carotid disease, sinus node dysfunction, hypertension, hyperlipidemia, and obstructive sleep apnea. Has PAD with recent revision of left common femoral endarterectomy 02/2018.  He is doing well.  He has not had anginal discomfort.  He denies dyspnea.  He did undergo significant revascularization of the left lower extremity by Dr. [name].  He feels his left leg is stiff and he is concerned about this.  He is not doing much walking.  I did encourage 150 minutes of moderate activity.  He denies chills, fever, and cough.  The patient does not have symptoms concerning for COVID-19 infection (fever, chills, cough, or new shortness of breath).    Past Medical History:  Diagnosis Date  . Arthritis   . Carotid disease, bilateral ([place])   . Chronic back pain    stenosis  . Coronary artery disease    with bare mental stent 2008 and  RCA is Card   Dr.  . Diabetes mellitus without complication ([place])    takes Metformin daily  . Headache    occasionally  . Heart murmur    as a teenager, no problems as an adult  . History of colon polyps    benign  . Hyperlipidemia    takes Crestor daily  . Hypertension    takes Amlodipine,Bystolic,Apresoline,and Lisinopril daily.   . Joint pain   . Other specified cardiac dysrhythmias(427.89)   . Peripheral edema    takes Lasix daily  . Peripheral neuropathy   . Sinoatrial node dysfunction (HCC)   . Sleep apnea    uses cpap  . Weakness    numbness and tingling,left leg   Past Surgical History:  Procedure Laterality Date  . ABDOMINAL AORTOGRAM N/A 12/29/2017   Procedure: ABDOMINAL AORTOGRAM;  Surgeon: Angelia Mould, MD;  Location: [facility];  Service: Cardiovascular;  Laterality: N/A;  . aortobifemoral bypass  1996  . BACK SURGERY  2016 and 2017   lumbar fusion  . CARDIAC CATHETERIZATION  2008  . CHOLECYSTECTOMY  2001  . COLONOSCOPY    . CORONARY ANGIOPLASTY     1 stent  . discectomy  20 yrs ago  . ESOPHAGOGASTRODUODENOSCOPY    . EYE SURGERY Right 2018   cataract surgery with lens implant  . FEMORAL-POPLITEAL BYPASS GRAFT Left 02/09/2018   Procedure: LEFT COMMON FEMORAL ENDARTERECTOMY WITH VEIN PATCH ANGIOPLASTY REVISION DISTAL LEFT LIMB OF AORTOFEMORAL BYPASS GRAFT;  Surgeon: Angelia Mould, MD;  Location: [facility] OR;  Service: Vascular;  Laterality: Left;  . LOWER EXTREMITY ANGIOGRAPHY Bilateral 12/29/2017   Procedure: Lower Extremity Angiography;  Surgeon: Angelia Mould, MD;  Location: [facility];  Service: Cardiovascular;  Laterality: Bilateral;     No outpatient medications have been marked as taking for the 11/15/18 encounter (Appointment) with Belva Crome, MD.     Allergies:   Penicillins   Social History   Tobacco Use  . Smoking status: Former Smoker    Last attempt to quit: 01/24/2018    Years since quitting: 0.8   . Smokeless tobacco: Never Used  Substance Use Topics  . Alcohol use: Yes    Comment: socially  . Drug use: No     Family Hx: The patient's family history includes Cancer in his mother; Heart attack in his mother; Heart disease in his mother; Hyperlipidemia in an other family member; Hypertension in his father, mother, and another family member. There is no history of Stroke.  ROS:   Please see the history of present illness.    Stiff left leg.  No edema. All other systems reviewed and are negative.   Prior CV studies:   The following studies were reviewed today:  No new data.  Labs/Other Tests and Data Reviewed:    EKG:  No ECG reviewed.  Recent Labs: 01/31/2018: ALT 26 02/10/2018: BUN 24; Creatinine, Ser 0.93; Hemoglobin 11.7; Platelets 168; Potassium 4.8; Sodium 139   Recent Lipid Panel Lab Results  Component Value Date/Time   CHOL  03/29/2007 12:15 AM    194        ATP III CLASSIFICATION:  <200     mg/dL   Desirable  200-239  mg/dL   Borderline High  >=240    mg/dL   High   TRIG 140 03/29/2007 12:15 AM   HDL 39 (L) 03/29/2007 12:15 AM   CHOLHDL 5.0 03/29/2007 12:15 AM   LDLCALC (H) 03/29/2007 12:15 AM    127        Total Cholesterol/HDL:CHD Risk Coronary Heart Disease Risk Table                     Men   Women  1/2 Average Risk   3.4   3.3    Wt Readings from Last 3 Encounters:  09/05/18 272 lb 11.3 oz (123.7 kg)  02/26/18 268 lb (121.6 kg)  02/11/18 269 lb 11.2 oz (122.3 kg)     Objective:    Vital Signs:  There were no vitals taken for this visit.   VITAL SIGNS:  reviewed GEN:  Significant obesity. RESPIRATORY:  normal respiratory effort, symmetric expansion CARDIOVASCULAR:  no peripheral edema NEURO:  alert and oriented x 3, no obvious focal deficit  ASSESSMENT & PLAN:    1. Coronary artery disease involving native coronary artery of native heart without angina pectoris   2. Essential hypertension   3. Hyperlipidemia with target LDL less than  70   4. Claudication ([place])   5. OSA on CPAP   6. 2019 novel coronavirus disease (COVID-19)    Plan:  1. He has no overt cardiac symptoms.  We did discuss secondary prevention.  I encouraged increased physical activity attempting to achieve 150 minutes of moderate activity per week. 2. Blood pressure is mildly elevated today.  He take it right after he took his medications this morning so these numbers do not reflect what the blood pressure will be  once the medications are fully absorbed.  Target 140/80 mmHg. 3. LDL target less than 70 is being achieved with the most recent LDL being 64 in November 2019. 4. States his leg feels tight.  I am not quite sure what this means.  He has discussed this with [facility] Dr. [name].  I did encourage walking. 5. Encouraged compliance with CPAP.  Overall education and awareness concerning primary/secondary risk prevention was discussed in detail: LDL less than 70, hemoglobin A1c less than 7, blood pressure target less than 130/80 mmHg, >150 minutes of moderate aerobic activity per week, avoidance of smoking, weight control (via diet and exercise), and continued surveillance/management of/for obstructive sleep apnea.   COVID-19 Education: The signs and symptoms of COVID-19 were discussed with the patient and how to seek care for testing (follow up with PCP or arrange E-visit).  The importance of social distancing was discussed today.  Time:   Today, I have spent 18 minutes with the patient with telehealth technology discussing the above problems.     Medication Adjustments/Labs and Tests Ordered: Current medicines are reviewed at length with the patient today.  Concerns regarding medicines are outlined above.   Tests Ordered: No orders of the defined types were placed in this encounter.   Medication Changes: No orders of the defined types were placed in this encounter.   Disposition:  Follow up in 6 month(s)  Signed, Sinclair Grooms, MD  11/14/2018  8:02 PM    [facility]

## 2018-11-15 ENCOUNTER — Encounter: Payer: Self-pay | Admitting: Interventional Cardiology

## 2018-11-15 ENCOUNTER — Other Ambulatory Visit: Payer: Self-pay

## 2018-11-15 ENCOUNTER — Telehealth (INDEPENDENT_AMBULATORY_CARE_PROVIDER_SITE_OTHER): Payer: PPO | Admitting: Interventional Cardiology

## 2018-11-15 VITALS — BP 156/58 | HR 52 | Ht 70.5 in | Wt 260.0 lb

## 2018-11-15 DIAGNOSIS — U071 COVID-19: Secondary | ICD-10-CM

## 2018-11-15 DIAGNOSIS — Z9989 Dependence on other enabling machines and devices: Secondary | ICD-10-CM

## 2018-11-15 DIAGNOSIS — I10 Essential (primary) hypertension: Secondary | ICD-10-CM

## 2018-11-15 DIAGNOSIS — I739 Peripheral vascular disease, unspecified: Secondary | ICD-10-CM

## 2018-11-15 DIAGNOSIS — G4733 Obstructive sleep apnea (adult) (pediatric): Secondary | ICD-10-CM

## 2018-11-15 DIAGNOSIS — I2510 Atherosclerotic heart disease of native coronary artery without angina pectoris: Secondary | ICD-10-CM | POA: Diagnosis not present

## 2018-11-15 DIAGNOSIS — E785 Hyperlipidemia, unspecified: Secondary | ICD-10-CM

## 2018-11-15 NOTE — Patient Instructions (Addendum)
Medication Instructions:  1) AVOID NSAIDS such as Ibuprofen or Aleve as they can contribute to high blood pressure  If you need a refill on your cardiac medications before your next appointment, please call your pharmacy.   Lab work: Please contact our office if Dr. Delfina Redwood does not do labs on you this summer and we will plan to do them here.  If you have labs (blood work) drawn today and your tests are completely normal, you will receive your results only by: Marland Kitchen MyChart Message (if you have MyChart) OR . A paper copy in the mail If you have any lab test that is abnormal or we need to change your treatment, we will call you to review the results.  Testing/Procedures: None  Follow-Up: At [facility], you and your health needs are our priority.  As part of our continuing mission to provide you with exceptional heart care, we have created designated Provider Care Teams.  These Care Teams include your primary Cardiologist (physician) and Advanced Practice Providers (APPs -  Physician Assistants and Nurse Practitioners) who all work together to provide you with the care you need, when you need it. You will need a follow up appointment in 6 months.  Please call our office 2 months in advance to schedule this appointment.  You may see Dr. Tamala Julian or one of the following Advanced Practice Providers on your designated Care Team:   Truitt Merle, NP Cecilie Kicks, NP . Kathyrn Drown, NP  Any Other Special Instructions Will Be Listed Below (If Applicable).   Low-Sodium Eating Plan Sodium, which is an element that makes up salt, helps you maintain a healthy balance of fluids in your body. Too much sodium can increase your blood pressure and cause fluid and waste to be held in your body. Your health care provider or dietitian may recommend following this plan if you have high blood pressure (hypertension), kidney disease, liver disease, or heart failure. Eating less sodium can help lower your blood  pressure, reduce swelling, and protect your heart, liver, and kidneys. What are tips for following this plan? General guidelines  Most people on this plan should limit their sodium intake to 1,500-2,000 mg (milligrams) of sodium each day. Reading food labels   The Nutrition Facts label lists the amount of sodium in one serving of the food. If you eat more than one serving, you must multiply the listed amount of sodium by the number of servings.  Choose foods with less than 140 mg of sodium per serving.  Avoid foods with 300 mg of sodium or more per serving. Shopping  Look for lower-sodium products, often labeled as "low-sodium" or "no salt added."  Always check the sodium content even if foods are labeled as "unsalted" or "no salt added".  Buy fresh foods. ? Avoid canned foods and premade or frozen meals. ? Avoid canned, cured, or processed meats  Buy breads that have less than 80 mg of sodium per slice. Cooking  Eat more home-cooked food and less restaurant, buffet, and fast food.  Avoid adding salt when cooking. Use salt-free seasonings or herbs instead of table salt or sea salt. Check with your health care provider or pharmacist before using salt substitutes.  Cook with plant-based oils, such as canola, sunflower, or olive oil. Meal planning  When eating at a restaurant, ask that your food be prepared with less salt or no salt, if possible.  Avoid foods that contain MSG (monosodium glutamate). MSG is sometimes added to [place],  bouillon, and some canned foods. What foods are recommended? The items listed may not be a complete list. Talk with your dietitian about what dietary choices are best for you. Grains Low-sodium cereals, including oats, puffed wheat and rice, and shredded wheat. Low-sodium crackers. Unsalted rice. Unsalted pasta. Low-sodium bread. Whole-grain breads and whole-grain pasta. Vegetables Fresh or frozen vegetables. "No salt added" canned vegetables.  "No salt added" tomato sauce and paste. Low-sodium or reduced-sodium tomato and vegetable juice. Fruits Fresh, frozen, or canned fruit. Fruit juice. Meats and other protein foods Fresh or frozen (no salt added) meat, poultry, seafood, and fish. Low-sodium canned tuna and salmon. Unsalted nuts. Dried peas, beans, and lentils without added salt. Unsalted canned beans. Eggs. Unsalted nut butters. Dairy Milk. Soy milk. Cheese that is naturally low in sodium, such as ricotta cheese, fresh mozzarella, or Swiss cheese Low-sodium or reduced-sodium cheese. Cream cheese. Yogurt. Fats and oils Unsalted butter. Unsalted margarine with no trans fat. Vegetable oils such as canola or olive oils. Seasonings and other foods Fresh and dried herbs and spices. Salt-free seasonings. Low-sodium mustard and ketchup. Sodium-free salad dressing. Sodium-free light mayonnaise. Fresh or refrigerated horseradish. Lemon juice. Vinegar. Homemade, reduced-sodium, or low-sodium soups. Unsalted popcorn and pretzels. Low-salt or salt-free chips. What foods are not recommended? The items listed may not be a complete list. Talk with your dietitian about what dietary choices are best for you. Grains Instant hot cereals. Bread stuffing, pancake, and biscuit mixes. Croutons. Seasoned rice or pasta mixes. Noodle soup cups. Boxed or frozen macaroni and cheese. Regular salted crackers. Self-rising flour. Vegetables Sauerkraut, pickled vegetables, and relishes. Olives. [place] fries. Onion rings. Regular canned vegetables (not low-sodium or reduced-sodium). Regular canned tomato sauce and paste (not low-sodium or reduced-sodium). Regular tomato and vegetable juice (not low-sodium or reduced-sodium). Frozen vegetables in sauces. Meats and other protein foods Meat or fish that is salted, canned, smoked, spiced, or pickled. Bacon, ham, sausage, hotdogs, corned beef, chipped beef, packaged lunch meats, salt pork, jerky, pickled herring,  anchovies, regular canned tuna, sardines, salted nuts. Dairy Processed cheese and cheese spreads. Cheese curds. Blue cheese. Feta cheese. String cheese. Regular cottage cheese. Buttermilk. Canned milk. Fats and oils Salted butter. Regular margarine. Ghee. Bacon fat. Seasonings and other foods Onion salt, garlic salt, seasoned salt, table salt, and sea salt. Canned and packaged gravies. Worcestershire sauce. Tartar sauce. Barbecue sauce. Teriyaki sauce. Soy sauce, including reduced-sodium. Steak sauce. Fish sauce. Oyster sauce. Cocktail sauce. Horseradish that you find on the shelf. Regular ketchup and mustard. Meat flavorings and tenderizers. Bouillon cubes. Hot sauce and Tabasco sauce. Premade or packaged marinades. Premade or packaged taco seasonings. Relishes. Regular salad dressings. Salsa. Potato and tortilla chips. Corn chips and puffs. Salted popcorn and pretzels. Canned or dried soups. Pizza. Frozen entrees and pot pies. Summary  Eating less sodium can help lower your blood pressure, reduce swelling, and protect your heart, liver, and kidneys.  Most people on this plan should limit their sodium intake to 1,500-2,000 mg (milligrams) of sodium each day.  Canned, boxed, and frozen foods are high in sodium. Restaurant foods, fast foods, and pizza are also very high in sodium. You also get sodium by adding salt to food.  Try to cook at home, eat more fresh fruits and vegetables, and eat less fast food, canned, processed, or prepared foods. This information is not intended to replace advice given to you by your health care provider. Make sure you discuss any questions you have with your health care provider. Document  Released: 12/17/2001 Document Revised: 06/20/2016 Document Reviewed: 06/20/2016 Elsevier Interactive Patient Education  2019 [employer].

## 2018-11-21 DIAGNOSIS — G629 Polyneuropathy, unspecified: Secondary | ICD-10-CM | POA: Diagnosis not present

## 2018-11-21 DIAGNOSIS — E7800 Pure hypercholesterolemia, unspecified: Secondary | ICD-10-CM | POA: Diagnosis not present

## 2018-11-21 DIAGNOSIS — I739 Peripheral vascular disease, unspecified: Secondary | ICD-10-CM | POA: Diagnosis not present

## 2018-11-21 DIAGNOSIS — I2510 Atherosclerotic heart disease of native coronary artery without angina pectoris: Secondary | ICD-10-CM | POA: Diagnosis not present

## 2018-11-21 DIAGNOSIS — I10 Essential (primary) hypertension: Secondary | ICD-10-CM | POA: Diagnosis not present

## 2018-11-21 DIAGNOSIS — Z7984 Long term (current) use of oral hypoglycemic drugs: Secondary | ICD-10-CM | POA: Diagnosis not present

## 2018-11-21 DIAGNOSIS — E1151 Type 2 diabetes mellitus with diabetic peripheral angiopathy without gangrene: Secondary | ICD-10-CM | POA: Diagnosis not present

## 2018-11-21 DIAGNOSIS — G4733 Obstructive sleep apnea (adult) (pediatric): Secondary | ICD-10-CM | POA: Diagnosis not present

## 2018-11-22 DIAGNOSIS — I10 Essential (primary) hypertension: Secondary | ICD-10-CM | POA: Diagnosis not present

## 2018-11-22 DIAGNOSIS — I2510 Atherosclerotic heart disease of native coronary artery without angina pectoris: Secondary | ICD-10-CM | POA: Diagnosis not present

## 2018-11-22 DIAGNOSIS — E1151 Type 2 diabetes mellitus with diabetic peripheral angiopathy without gangrene: Secondary | ICD-10-CM | POA: Diagnosis not present

## 2018-11-22 DIAGNOSIS — E7800 Pure hypercholesterolemia, unspecified: Secondary | ICD-10-CM | POA: Diagnosis not present

## 2018-12-10 ENCOUNTER — Other Ambulatory Visit: Payer: Self-pay | Admitting: Interventional Cardiology

## 2018-12-11 ENCOUNTER — Other Ambulatory Visit: Payer: Self-pay | Admitting: Interventional Cardiology

## 2018-12-16 ENCOUNTER — Other Ambulatory Visit: Payer: Self-pay | Admitting: Interventional Cardiology

## 2018-12-16 DIAGNOSIS — I10 Essential (primary) hypertension: Secondary | ICD-10-CM

## 2018-12-19 ENCOUNTER — Telehealth: Payer: Self-pay | Admitting: Interventional Cardiology

## 2018-12-19 MED ORDER — HYDRALAZINE HCL 25 MG PO TABS: 25.0000 mg | ORAL_TABLET | Freq: Three times a day (TID) | ORAL | 3 refills | Status: DC

## 2018-12-19 NOTE — Telephone Encounter (Signed)
Pt's medication was sent to pt's pharmacy as requested. Confirmation received.  °

## 2018-12-19 NOTE — Telephone Encounter (Signed)
New message   Pt c/o medication issue:  1. Name of Medication:  hydrALAZINE (APRESOLINE) 25 MG tablet     2. How are you currently taking this medication (dosage and times per day)? 3 times daily  3. Are you having a reaction (difficulty breathing--STAT)? No   4. What is your medication issue? Patient states that he needs a new prescription for this medication sent to [facility] at [address] and [address].

## 2019-01-30 ENCOUNTER — Other Ambulatory Visit: Payer: Self-pay | Admitting: Interventional Cardiology

## 2019-02-07 ENCOUNTER — Other Ambulatory Visit: Payer: Self-pay | Admitting: Interventional Cardiology

## 2019-03-08 ENCOUNTER — Other Ambulatory Visit: Payer: Self-pay | Admitting: Interventional Cardiology

## 2019-05-16 ENCOUNTER — Other Ambulatory Visit: Payer: Self-pay | Admitting: Interventional Cardiology

## 2019-05-28 ENCOUNTER — Other Ambulatory Visit: Payer: Self-pay | Admitting: Interventional Cardiology

## 2019-06-13 NOTE — Progress Notes (Signed)
Cardiology Office Note:    Date:  06/14/2019   ID:  Kenneth Lee, DOB 08/04/1945, MRN [id number]  PCP:  Seward Carol, MD  Cardiologist:  Sinclair Grooms, MD   Referring MD: Seward Carol, MD   Chief Complaint  Patient presents with  . Coronary Artery Disease  . Advice Only    Carotid disease  . Claudication    History of Present Illness:    Kenneth Lee is a 73 y.o. male with a hx of CAD with her mental stent RCA 2008, bilateral carotid disease, sinus node dysfunction, hypertension, hyperlipidemia, and obstructive sleep apnea. Has PAD with recent revisionof left common femoral endarterectomy 02/2018.  Kenneth Lee underwent left iliofemoral endarterectomy earlier this year. This was preceded by a myocardial perfusion study to establish cardiovascular clearance for the procedure. He had no complications. He denies angina, shortness of breath, claudication, edema, orthopnea, PND, and transient neurological symptoms.  He complains of significant left pulsatile tinnitus.    Past Medical History:  Diagnosis Date  . Arthritis   . Carotid disease, bilateral ([place])   . Chronic back pain    stenosis  . Coronary artery disease    with bare mental stent 2008 and RCA is Card   Dr.Chistine Dematteo  . Diabetes mellitus without complication ([place])    takes Metformin daily  . Headache    occasionally  . Heart murmur    as a teenager, no problems as an adult  . History of colon polyps    benign  . Hyperlipidemia    takes Crestor daily  . Hypertension    takes Amlodipine,Bystolic,Apresoline,and Lisinopril daily.   . Joint pain   . Other specified cardiac dysrhythmias(427.89)   . Peripheral edema    takes Lasix daily  . Peripheral neuropathy   . Sinoatrial node dysfunction (HCC)   . Sleep apnea    uses cpap  . Weakness    numbness and tingling,left leg    Past Surgical History:  Procedure Laterality Date  . ABDOMINAL AORTOGRAM N/A 12/29/2017   Procedure: ABDOMINAL  AORTOGRAM;  Surgeon: Angelia Mould, MD;  Location: [facility];  Service: Cardiovascular;  Laterality: N/A;  . aortobifemoral bypass  1996  . BACK SURGERY  2016 and 2017   lumbar fusion  . CARDIAC CATHETERIZATION  2008  . CHOLECYSTECTOMY  2001  . COLONOSCOPY    . CORONARY ANGIOPLASTY     1 stent  . discectomy  20 yrs ago  . ESOPHAGOGASTRODUODENOSCOPY    . EYE SURGERY Right 2018   cataract surgery with lens implant  . FEMORAL-POPLITEAL BYPASS GRAFT Left 02/09/2018   Procedure: LEFT COMMON FEMORAL ENDARTERECTOMY WITH VEIN PATCH ANGIOPLASTY REVISION DISTAL LEFT LIMB OF AORTOFEMORAL BYPASS GRAFT;  Surgeon: Angelia Mould, MD;  Location: [place] [facility];  Service: Vascular;  Laterality: Left;  . LOWER EXTREMITY ANGIOGRAPHY Bilateral 12/29/2017   Procedure: Lower Extremity Angiography;  Surgeon: Angelia Mould, MD;  Location: [facility];  Service: Cardiovascular;  Laterality: Bilateral;    Current Medications: Current Meds  Medication Sig  . amLODipine (NORVASC) 5 MG tablet TAKE 1 TABLET BY MOUTH EVERY DAY  . aspirin 325 MG tablet Take 325 mg by mouth daily.  Marland Kitchen BYSTOLIC 5 MG tablet TAKE 1 TABLET BY MOUTH EVERY DAY  . fluticasone (FLONASE) 50 MCG/ACT nasal spray Place 2 sprays into both nostrils at bedtime.   . furosemide (LASIX) 40 MG tablet TAKE 1 TABLET BY MOUTH EVERY DAY  . gabapentin (  NEURONTIN) 100 MG capsule TAKE 1 CAPSULE BY MOUTH THREE TIMES A DAY  . hydrALAZINE (APRESOLINE) 25 MG tablet Take 1 tablet (25 mg total) by mouth 3 (three) times daily.  Marland Kitchen lisinopril (ZESTRIL) 20 MG tablet TAKE 1 TABLET BY MOUTH 2 TIMES DAILY. .  . meloxicam (MOBIC) 15 MG tablet Take 15 mg by mouth daily.  . metFORMIN (GLUCOPHAGE) 1000 MG tablet Take 1,000 mg by mouth 2 (two) times daily with a meal.   . Multiple Vitamin (MULTIVITAMIN WITH MINERALS) TABS tablet Take 1 tablet by mouth daily.  . Naphazoline HCl (CLEAR EYES OP) Place 1 drop into both eyes daily.  . nitroGLYCERIN  (NITROSTAT) 0.4 MG SL tablet Place 0.4 mg under the tongue every 5 (five) minutes as needed for chest pain.  . Omega-3 Fatty Acids (FISH OIL) 1000 MG CAPS Take 1,000 mg by mouth daily.   . rosuvastatin (CRESTOR) 10 MG tablet TAKE 1 TABLET BY MOUTH EVERY DAY  . spironolactone (ALDACTONE) 25 MG tablet TAKE 1 TABLET BY MOUTH EVERY DAY     Allergies:   Penicillins   Social History   Socioeconomic History  . Marital status: Single    Spouse name: Not on file  . Number of children: Not on file  . Years of education: Not on file  . Highest education level: Not on file  Occupational History  . Not on file  Social Needs  . Financial resource strain: Not on file  . Food insecurity    Worry: Not on file    Inability: Not on file  . Transportation needs    Medical: Not on file    Non-medical: Not on file  Tobacco Use  . Smoking status: Former Smoker    Quit date: 01/24/2018    Years since quitting: 1.3  . Smokeless tobacco: Never Used  Substance and Sexual Activity  . Alcohol use: Yes    Comment: socially  . Drug use: No  . Sexual activity: Not on file  Lifestyle  . Physical activity    Days per week: Not on file    Minutes per session: Not on file  . Stress: Not on file  Relationships  . Social Herbalist on phone: Not on file    Gets together: Not on file    Attends religious service: Not on file    Active member of club or organization: Not on file    Attends meetings of clubs or organizations: Not on file    Relationship status: Not on file  Other Topics Concern  . Not on file  Social History Narrative  . Not on file     Family History: The patient's family history includes Cancer in his mother; Heart attack in his mother; Heart disease in his mother; Hyperlipidemia in an other family member; Hypertension in his father, mother, and another family member. There is no history of Stroke.  ROS:   Please see the history of present illness.    Left carotid  bruit. CPAP was causing recurrent left ear infections. Pulsatile tinnitus as noted above. He has received new CPAP equipment. All other systems reviewed and are negative.  EKGs/Labs/Other Studies Reviewed:    The following studies were reviewed today: Myocardial perfusion imaging 01/24/2018: Study Highlights    Nuclear stress EF: 55%. No wall motion abnormalities  There was no ST segment deviation noted during stress.  Defect 1: There is a small defect of mild severity present in the apex location.  This is a low risk study. No evidence of ischemia identified. Prior bare-metal stent to RCA.   Candee Furbish, MD   Carotid Doppler study 2017: Technically challenging study. Heterogeneous plaque, bilaterally. Stable 1-39% bilateral ICA stenosis. >50% LECA stenosis. Normal subclavian arteries, bilaterally. Patent vertebral arteries with antegrade flow.  EKG:  EKG sinus bradycardia, 50 bpm, poor R wave progression V1 through V4.  Vertical axis.  When compared to 2019, heart rate is now faster and poor R wave progression is new.  Recent Labs: No results found for requested labs within last 8760 hours.  Recent Lipid Panel    Component Value Date/Time   CHOL  03/29/2007 0015    194        ATP III CLASSIFICATION:  <200     mg/dL   Desirable  200-239  mg/dL   Borderline High  >=240    mg/dL   High   TRIG 140 03/29/2007 0015   HDL 39 (L) 03/29/2007 0015   CHOLHDL 5.0 03/29/2007 0015   VLDL 28 03/29/2007 0015   LDLCALC (H) 03/29/2007 0015    127        Total Cholesterol/HDL:CHD Risk Coronary Heart Disease Risk Table                     Men   Women  1/2 Average Risk   3.4   3.3    Physical Exam:    VS:  BP (!) 144/72   Pulse (!) 51   Ht 5' 10.5" (1.791 m)   Wt 266 lb 6.4 oz (120.8 kg)   SpO2 95%   BMI 37.68 kg/m     Wt Readings from Last 3 Encounters:  06/14/19 266 lb 6.4 oz (120.8 kg)  11/15/18 260 lb (117.9 kg)  09/05/18 272 lb 11.3 oz (123.7 kg)     GEN: Morbid  obesity. Walks using a cane.. No acute distress HEENT: Normal NECK: Loud left carotid bruit LYMPHATICS: No lymphadenopathy CARDIAC:  RRR without murmur, gallop, or edema. VASCULAR:  Normal Pulses. No bruits. RESPIRATORY:  Clear to auscultation without rales, wheezing or rhonchi  ABDOMEN: Soft, non-tender, non-distended, No pulsatile mass, MUSCULOSKELETAL: No deformity  SKIN: Warm and dry NEUROLOGIC:  Alert and oriented x 3 PSYCHIATRIC:  Normal affect   ASSESSMENT:    1. Coronary artery disease involving native coronary artery of native heart without angina pectoris   2. Essential hypertension   3. Hyperlipidemia with target LDL less than 70   4. OSA on CPAP   5. PAD (peripheral artery disease) ([place])   6. Educated about COVID-19 virus infection   7. Bruit of left carotid artery    PLAN:    In order of problems listed above:  1. Secondary prevention discussed in granular detail. He needs to become more physically active. 2. Target blood pressure higher than I would like. I will not change medication at this time but recommend decreasing salt in diet and weight loss. It is 130/80 acceptable is 140/80 or less. 3. LDL target is being achieved with most recent LDL from May 1957. 4. Continue CPAP. Unclean equipment has led to recurrent left ear infections according to the patient. This is been resolved. He has received new equipment. 5. Recent left iliofemoral endarterectomy by Dr. Dayton Scrape. 6. The 3W's is being practiced to avoid COVID-19 infection. 7. The loud left carotid bruit may be contributed to the pulsatile tinnitus. A carotid Doppler study needs to be repeated. Last study was 2017.  Overall education and awareness concerning primary/secondary risk prevention was discussed in detail: LDL less than 70, hemoglobin A1c less than 7, blood pressure target less than 130/80 mmHg, >150 minutes of moderate aerobic activity per week, avoidance of smoking, weight control (via diet and  exercise), and continued surveillance/management of/for obstructive sleep apnea.    Medication Adjustments/Labs and Tests Ordered: Current medicines are reviewed at length with the patient today.  Concerns regarding medicines are outlined above.  Orders Placed This Encounter  Procedures  . EKG 12-Lead  . VAS US CAROTID   No orders of the defined types were placed in this encounter.   Patient Instructions  Medication Instructions:  Your physician recommends that you continue on your current medications as directed. Please refer to the Current Medication list given to you today.  *If you need a refill on your cardiac medications before your next appointment, please call your pharmacy*  Lab Work: None If you have labs (blood work) drawn today and your tests are completely normal, you will receive your results only by: Marland Kitchen MyChart Message (if you have MyChart) OR . A paper copy in the mail If you have any lab test that is abnormal or we need to change your treatment, we will call you to review the results.  Testing/Procedures: Your physician has requested that you have a carotid duplex. This test is an ultrasound of the carotid arteries in your neck. It looks at blood flow through these arteries that supply the brain with blood. Allow one hour for this exam. There are no restrictions or special instructions.   Follow-Up: At [facility], you and your health needs are our priority.  As part of our continuing mission to provide you with exceptional heart care, we have created designated Provider Care Teams.  These Care Teams include your primary Cardiologist (physician) and Advanced Practice Providers (APPs -  Physician Assistants and Nurse Practitioners) who all work together to provide you with the care you need, when you need it.  Your next appointment:   12 month(s)  The format for your next appointment:   In Person  Provider:   You may see Sinclair Grooms, MD or one of the  following Advanced Practice Providers on your designated Care Team:    Truitt Merle, NP  Cecilie Kicks, NP  Kathyrn Drown, NP   Other Instructions      Signed, Sinclair Grooms, MD  06/14/2019 10:22 AM    [place]

## 2019-06-14 ENCOUNTER — Other Ambulatory Visit: Payer: Self-pay

## 2019-06-14 ENCOUNTER — Encounter: Payer: Self-pay | Admitting: Interventional Cardiology

## 2019-06-14 ENCOUNTER — Ambulatory Visit: Payer: PPO | Admitting: Interventional Cardiology

## 2019-06-14 VITALS — BP 144/72 | HR 51 | Ht 70.5 in | Wt 266.4 lb

## 2019-06-14 DIAGNOSIS — I2510 Atherosclerotic heart disease of native coronary artery without angina pectoris: Secondary | ICD-10-CM | POA: Diagnosis not present

## 2019-06-14 DIAGNOSIS — E785 Hyperlipidemia, unspecified: Secondary | ICD-10-CM | POA: Diagnosis not present

## 2019-06-14 DIAGNOSIS — G4733 Obstructive sleep apnea (adult) (pediatric): Secondary | ICD-10-CM

## 2019-06-14 DIAGNOSIS — R0989 Other specified symptoms and signs involving the circulatory and respiratory systems: Secondary | ICD-10-CM

## 2019-06-14 DIAGNOSIS — Z7189 Other specified counseling: Secondary | ICD-10-CM | POA: Diagnosis not present

## 2019-06-14 DIAGNOSIS — Z9989 Dependence on other enabling machines and devices: Secondary | ICD-10-CM | POA: Diagnosis not present

## 2019-06-14 DIAGNOSIS — I10 Essential (primary) hypertension: Secondary | ICD-10-CM

## 2019-06-14 DIAGNOSIS — I739 Peripheral vascular disease, unspecified: Secondary | ICD-10-CM

## 2019-06-14 NOTE — Patient Instructions (Signed)
Medication Instructions:  Your physician recommends that you continue on your current medications as directed. Please refer to the Current Medication list given to you today.  *If you need a refill on your cardiac medications before your next appointment, please call your pharmacy*  Lab Work: None If you have labs (blood work) drawn today and your tests are completely normal, you will receive your results only by: Marland Kitchen MyChart Message (if you have MyChart) OR . A paper copy in the mail If you have any lab test that is abnormal or we need to change your treatment, we will call you to review the results.  Testing/Procedures: Your physician has requested that you have a carotid duplex. This test is an ultrasound of the carotid arteries in your neck. It looks at blood flow through these arteries that supply the brain with blood. Allow one hour for this exam. There are no restrictions or special instructions.   Follow-Up: At [facility], you and your health needs are our priority.  As part of our continuing mission to provide you with exceptional heart care, we have created designated Provider Care Teams.  These Care Teams include your primary Cardiologist (physician) and Advanced Practice Providers (APPs -  Physician Assistants and Nurse Practitioners) who all work together to provide you with the care you need, when you need it.  Your next appointment:   12 month(s)  The format for your next appointment:   In Person  Provider:   You may see Sinclair Grooms, MD or one of the following Advanced Practice Providers on your designated Care Team:    Truitt Merle, NP  Cecilie Kicks, NP  Kathyrn Drown, NP   Other Instructions

## 2019-06-18 DIAGNOSIS — E7800 Pure hypercholesterolemia, unspecified: Secondary | ICD-10-CM | POA: Diagnosis not present

## 2019-06-18 DIAGNOSIS — E1151 Type 2 diabetes mellitus with diabetic peripheral angiopathy without gangrene: Secondary | ICD-10-CM | POA: Diagnosis not present

## 2019-06-18 DIAGNOSIS — Z125 Encounter for screening for malignant neoplasm of prostate: Secondary | ICD-10-CM | POA: Diagnosis not present

## 2019-06-18 DIAGNOSIS — I10 Essential (primary) hypertension: Secondary | ICD-10-CM | POA: Diagnosis not present

## 2019-06-18 DIAGNOSIS — I739 Peripheral vascular disease, unspecified: Secondary | ICD-10-CM | POA: Diagnosis not present

## 2019-06-18 DIAGNOSIS — Z1389 Encounter for screening for other disorder: Secondary | ICD-10-CM | POA: Diagnosis not present

## 2019-06-18 DIAGNOSIS — G4733 Obstructive sleep apnea (adult) (pediatric): Secondary | ICD-10-CM | POA: Diagnosis not present

## 2019-06-18 DIAGNOSIS — R0989 Other specified symptoms and signs involving the circulatory and respiratory systems: Secondary | ICD-10-CM | POA: Diagnosis not present

## 2019-06-18 DIAGNOSIS — Z0000 Encounter for general adult medical examination without abnormal findings: Secondary | ICD-10-CM | POA: Diagnosis not present

## 2019-06-18 DIAGNOSIS — I2510 Atherosclerotic heart disease of native coronary artery without angina pectoris: Secondary | ICD-10-CM | POA: Diagnosis not present

## 2019-06-24 ENCOUNTER — Ambulatory Visit (HOSPITAL_COMMUNITY)
Admission: RE | Admit: 2019-06-24 | Discharge: 2019-06-24 | Disposition: A | Discharge status: Home / Self Care | Payer: PPO | Source: Ambulatory Visit | Attending: Cardiovascular Disease | Admitting: Cardiovascular Disease

## 2019-06-24 ENCOUNTER — Other Ambulatory Visit (HOSPITAL_COMMUNITY): Payer: Self-pay | Admitting: Interventional Cardiology

## 2019-06-24 ENCOUNTER — Other Ambulatory Visit: Payer: Self-pay

## 2019-06-24 DIAGNOSIS — R0989 Other specified symptoms and signs involving the circulatory and respiratory systems: Secondary | ICD-10-CM

## 2019-06-24 DIAGNOSIS — I6523 Occlusion and stenosis of bilateral carotid arteries: Secondary | ICD-10-CM

## 2019-07-12 HISTORY — PX: PROSTATE BIOPSY: SHX241

## 2019-07-15 DIAGNOSIS — N403 Nodular prostate with lower urinary tract symptoms: Secondary | ICD-10-CM | POA: Diagnosis not present

## 2019-07-15 DIAGNOSIS — R3912 Poor urinary stream: Secondary | ICD-10-CM | POA: Diagnosis not present

## 2019-07-15 DIAGNOSIS — R972 Elevated prostate specific antigen [PSA]: Secondary | ICD-10-CM | POA: Diagnosis not present

## 2019-07-23 ENCOUNTER — Other Ambulatory Visit: Payer: Self-pay

## 2019-07-23 ENCOUNTER — Telehealth (HOSPITAL_COMMUNITY): Payer: Self-pay

## 2019-07-23 DIAGNOSIS — I739 Peripheral vascular disease, unspecified: Secondary | ICD-10-CM

## 2019-07-23 NOTE — Telephone Encounter (Signed)

## 2019-07-24 ENCOUNTER — Ambulatory Visit (HOSPITAL_COMMUNITY)
Admission: RE | Admit: 2019-07-24 | Discharge: 2019-07-24 | Disposition: A | Discharge status: Home / Self Care | Payer: PPO | Source: Ambulatory Visit | Attending: Vascular Surgery | Admitting: Vascular Surgery

## 2019-07-24 ENCOUNTER — Ambulatory Visit (INDEPENDENT_AMBULATORY_CARE_PROVIDER_SITE_OTHER): Payer: PPO | Admitting: Vascular Surgery

## 2019-07-24 ENCOUNTER — Encounter: Payer: Self-pay | Admitting: Vascular Surgery

## 2019-07-24 ENCOUNTER — Other Ambulatory Visit: Payer: Self-pay

## 2019-07-24 VITALS — BP 116/62 | HR 50 | Temp 97.8°F | Resp 20 | Ht 70.5 in | Wt 267.0 lb

## 2019-07-24 DIAGNOSIS — I739 Peripheral vascular disease, unspecified: Secondary | ICD-10-CM

## 2019-07-24 NOTE — Progress Notes (Signed)
Patient name: Kenneth Lee MRN: [id number] DOB: 01-18-1946 Sex: male  REASON FOR VISIT:   Follow-up of peripheral vascular disease.  HPI:   Kenneth Lee is a pleasant 74 y.o. male who I last saw on 09/05/2018.  The patient underwent an aortobifemoral bypass graft in 1996.  He had presented with narrowing of the distal anastomosis of the left limb as his graft in August 2019.  At that time he underwent endarterectomy of the left common femoral artery and deep femoral artery with patch angioplasty.  His SFAs are chronically occluded bilaterally.  At the time of his last visit, ABI on the left was 54%.  ABI on the right was 80%.  He was not a smoker.  He was on aspirin and a statin.  He comes in for 60-month follow-up visit.  Since I saw him last, he denies any history of claudication.  However, his activity is very much limited by his back pain to be does not walk too far.  He is able to do the elliptical.  He denies any history of rest pain or nonhealing ulcers.  He is not a smoker.  He is on aspirin and is on a statin.  Past Medical History:  Diagnosis Date  . Arthritis   . Carotid disease, bilateral ([place])   . Chronic back pain    stenosis  . Coronary artery disease    with bare mental stent 2008 and RCA is Card   Dr.Smith  . Diabetes mellitus without complication ([place])    takes Metformin daily  . Headache    occasionally  . Heart murmur    as a teenager, no problems as an adult  . History of colon polyps    benign  . Hyperlipidemia    takes Crestor daily  . Hypertension    takes Amlodipine,Bystolic,Apresoline,and Lisinopril daily.   . Joint pain   . Other specified cardiac dysrhythmias(427.89)   . Peripheral edema    takes Lasix daily  . Peripheral neuropathy   . Sinoatrial node dysfunction ([facility])   . Sleep apnea    uses cpap  . Weakness    numbness and tingling,left leg    Family History  Problem Relation Age of Onset  . Heart disease Mother   . Cancer  Mother   . Heart attack Mother   . Hypertension Mother   . Hypertension Father   . Hypertension Other   . Hyperlipidemia Other   . Stroke Neg Hx     SOCIAL HISTORY: Social History   Tobacco Use  . Smoking status: Former Smoker    Quit date: 01/24/2018    Years since quitting: 1.4  . Smokeless tobacco: Never Used  Substance Use Topics  . Alcohol use: Yes    Comment: socially    Allergies  Allergen Reactions  . Penicillins Swelling    PATIENT HAS HAD A PCN REACTION WITH IMMEDIATE RASH, FACIAL/TONGUE/THROAT SWELLING, SOB, OR LIGHTHEADEDNESS WITH HYPOTENSION:  #  #  YES  #  #  Has patient had a PCN reaction causing severe rash involving mucus membranes or skin necrosis: No Has patient had a PCN reaction that required hospitalization No Has patient had a PCN reaction occurring within the last 10 years: No    Current Outpatient Medications  Medication Sig Dispense Refill  . amLODipine (NORVASC) 5 MG tablet TAKE 1 TABLET BY MOUTH EVERY DAY 90 tablet 3  . aspirin 325 MG tablet Take 325 mg by mouth daily.    Marland Kitchen  BYSTOLIC 5 MG tablet TAKE 1 TABLET BY MOUTH EVERY DAY 90 tablet 2  . fluticasone (FLONASE) 50 MCG/ACT nasal spray Place 2 sprays into both nostrils at bedtime.   5  . furosemide (LASIX) 40 MG tablet TAKE 1 TABLET BY MOUTH EVERY DAY 90 tablet 1  . gabapentin (NEURONTIN) 300 MG capsule Take 300 mg by mouth 3 (three) times daily.     . hydrALAZINE (APRESOLINE) 25 MG tablet Take 1 tablet (25 mg total) by mouth 3 (three) times daily. 270 tablet 3  . lisinopril (ZESTRIL) 20 MG tablet TAKE 1 TABLET BY MOUTH 2 TIMES DAILY. . 180 tablet 3  . meloxicam (MOBIC) 15 MG tablet Take 15 mg by mouth daily.  7  . metFORMIN (GLUCOPHAGE) 1000 MG tablet Take 1,000 mg by mouth 2 (two) times daily with a meal.     . Multiple Vitamin (MULTIVITAMIN WITH MINERALS) TABS tablet Take 1 tablet by mouth daily.    . Naphazoline HCl (CLEAR EYES OP) Place 1 drop into both eyes daily.    . nitroGLYCERIN  (NITROSTAT) 0.4 MG SL tablet Place 0.4 mg under the tongue every 5 (five) minutes as needed for chest pain.    . Omega-3 Fatty Acids (FISH OIL) 1000 MG CAPS Take 1,000 mg by mouth daily.     . rosuvastatin (CRESTOR) 10 MG tablet TAKE 1 TABLET BY MOUTH EVERY DAY 90 tablet 3  . spironolactone (ALDACTONE) 25 MG tablet TAKE 1 TABLET BY MOUTH EVERY DAY 90 tablet 3   No current facility-administered medications for this visit.    REVIEW OF SYSTEMS:  [X]  denotes positive finding, [ ]  denotes negative finding Cardiac  Comments:  Chest pain or chest pressure:    Shortness of breath upon exertion:    Short of breath when lying flat:    Irregular heart rhythm:        Vascular    Pain in calf, thigh, or hip brought on by ambulation: x   Pain in feet at night that wakes you up from your sleep:  x   Blood clot in your veins:    Leg swelling:  x       Pulmonary    Oxygen at home:    Productive cough:     Wheezing:         Neurologic    Sudden weakness in arms or legs:     Sudden numbness in arms or legs:     Sudden onset of difficulty speaking or slurred speech:    Temporary loss of vision in one eye:     Problems with dizziness:         Gastrointestinal    Blood in stool:     Vomited blood:         Genitourinary    Burning when urinating:     Blood in urine:        Psychiatric    Major depression:         Hematologic    Bleeding problems:    Problems with blood clotting too easily:        Skin    Rashes or ulcers:        Constitutional    Fever or chills:     PHYSICAL EXAM:   Vitals:   07/24/19 1030  BP: 116/62  Pulse: (!) 50  Resp: 20  Temp: 97.8 F (36.6 C)  SpO2: 93%  Weight: 267 lb (121.1 kg)  Height: 5' 10.5" (1.791 m)  GENERAL: The patient is a well-nourished male, in no acute distress. The vital signs are documented above. CARDIAC: There is a regular rate and rhythm.  VASCULAR: I do not detect carotid bruits. He has palpable femoral pulses. I cannot  palpate pedal pulses. Both feet are warm and well perfused. PULMONARY: There is good air exchange bilaterally without wheezing or rales. ABDOMEN: Soft and non-tender with normal pitched bowel sounds.  MUSCULOSKELETAL: There are no major deformities or cyanosis. NEUROLOGIC: No focal weakness or paresthesias are detected. SKIN: There are no ulcers or rashes noted. PSYCHIATRIC: The patient has a normal affect.  DATA:    ARTERIAL DOPPLER STUDY: I have independently interpreted his arterial Doppler study today.  On the right side he has a monophasic dorsalis pedis and posterior tibial signal.  ABI is 83%.  Toe pressures 112 mmHg.  On the left side ABI is 56%.  He has monophasic signals in the dorsalis pedis and posterior tibial positions.  Toe pressure on the left is 58 mmHg.   MEDICAL ISSUES:   PERIPHERAL VASCULAR DISEASE: This patient has bilateral superficial femoral artery occlusions.  He has stable claudication.  His ABIs remained stable.  He is not a smoker.  I encouraged him to stay as active as possible.  We discussed continuing to use the elliptical as his back does not tolerate walking.  We also discussed potentially water aerobics which is another excellent way to get exercise for people with back and joint pain.  I have ordered follow-up ABIs in 1 year and I will see him back at that time.  He knows to call sooner if he has problems.  [name] [facility] [phone]

## 2019-07-25 ENCOUNTER — Other Ambulatory Visit: Payer: Self-pay | Admitting: *Deleted

## 2019-07-25 DIAGNOSIS — I739 Peripheral vascular disease, unspecified: Secondary | ICD-10-CM

## 2019-08-09 DIAGNOSIS — C61 Malignant neoplasm of prostate: Secondary | ICD-10-CM | POA: Diagnosis not present

## 2019-08-09 DIAGNOSIS — R972 Elevated prostate specific antigen [PSA]: Secondary | ICD-10-CM | POA: Diagnosis not present

## 2019-08-15 DIAGNOSIS — C61 Malignant neoplasm of prostate: Secondary | ICD-10-CM | POA: Diagnosis not present

## 2019-08-15 DIAGNOSIS — N5201 Erectile dysfunction due to arterial insufficiency: Secondary | ICD-10-CM | POA: Diagnosis not present

## 2019-08-15 DIAGNOSIS — R3912 Poor urinary stream: Secondary | ICD-10-CM | POA: Diagnosis not present

## 2019-08-15 DIAGNOSIS — N403 Nodular prostate with lower urinary tract symptoms: Secondary | ICD-10-CM | POA: Diagnosis not present

## 2019-08-19 ENCOUNTER — Encounter: Payer: Self-pay | Admitting: *Deleted

## 2019-08-19 ENCOUNTER — Other Ambulatory Visit (HOSPITAL_COMMUNITY): Payer: Self-pay | Admitting: Urology

## 2019-08-19 ENCOUNTER — Other Ambulatory Visit: Payer: Self-pay | Admitting: Urology

## 2019-08-19 DIAGNOSIS — C61 Malignant neoplasm of prostate: Secondary | ICD-10-CM

## 2019-08-24 ENCOUNTER — Ambulatory Visit: Payer: PPO | Attending: Internal Medicine

## 2019-08-24 DIAGNOSIS — Z23 Encounter for immunization: Secondary | ICD-10-CM | POA: Insufficient documentation

## 2019-08-24 NOTE — Progress Notes (Signed)
   Covid-19 Vaccination Clinic  Name:  Kenneth Lee    MRN: [id number] DOB: [birth year]  08/24/2019  [name] was observed post Covid-19 immunization for 15 minutes without incidence. He was provided with Vaccine Information Sheet and instruction to access the V-Safe system.   [name] was instructed to call 911 with any severe reactions post vaccine: Marland Kitchen Difficulty breathing  . Swelling of your face and throat  . A fast heartbeat  . A bad rash all over your body  . Dizziness and weakness    Immunizations Administered    Name Date Dose VIS Date Route   Pfizer COVID-19 Vaccine 08/24/2019  9:18 AM 0.3 mL 06/21/2019 Intramuscular   Manufacturer: [place]   Lot: [id number]   [place]: [id number]

## 2019-08-29 ENCOUNTER — Other Ambulatory Visit: Payer: Self-pay | Admitting: Interventional Cardiology

## 2019-09-03 ENCOUNTER — Encounter (HOSPITAL_COMMUNITY)
Admission: RE | Admit: 2019-09-03 | Discharge: 2019-09-03 | Disposition: A | Discharge status: Home / Self Care | Payer: PPO | Source: Ambulatory Visit | Attending: Urology | Admitting: Urology

## 2019-09-03 ENCOUNTER — Ambulatory Visit (HOSPITAL_COMMUNITY)
Admission: RE | Admit: 2019-09-03 | Discharge: 2019-09-03 | Disposition: A | Discharge status: Home / Self Care | Payer: PPO | Source: Ambulatory Visit | Attending: Urology | Admitting: Urology

## 2019-09-03 ENCOUNTER — Other Ambulatory Visit: Payer: Self-pay

## 2019-09-03 DIAGNOSIS — C61 Malignant neoplasm of prostate: Secondary | ICD-10-CM | POA: Diagnosis not present

## 2019-09-03 MED ORDER — TECHNETIUM TC 99M MEDRONATE IV KIT
20.0000 | PACK | Freq: Once | INTRAVENOUS | Status: AC | PRN
  Administered 2019-09-03: 21.9 via INTRAVENOUS

## 2019-09-04 DIAGNOSIS — D225 Melanocytic nevi of trunk: Secondary | ICD-10-CM | POA: Diagnosis not present

## 2019-09-04 DIAGNOSIS — L812 Freckles: Secondary | ICD-10-CM | POA: Diagnosis not present

## 2019-09-04 DIAGNOSIS — L821 Other seborrheic keratosis: Secondary | ICD-10-CM | POA: Diagnosis not present

## 2019-09-04 NOTE — Progress Notes (Signed)
GU Location of Tumor / Histology: prostatic adenocarcinoma  If Prostate Cancer, Gleason Score is (4 + 3) and PSA is (4.57). Prostate volume: 41 grams.  Carter Kitten was noted to have an elevated PSA and referred to Dr. Claudia Desanctis. DRE exam revealed a nodule thus biopsy was ordered.  Biopsies of prostate (if applicable) revealed:   Past/Anticipated interventions by urology, if any: prostate biopsy, CT abd/pelvis, bone scan (negative), referral to discuss radiotherapy options  Past/Anticipated interventions by medical oncology, if any: no  Weight changes, if any: denies  Bowel/Bladder complaints, if any: IPSS 2. SHIM 3 related to no sexual activity in six month. Denies dysuria, hematuria or leakage. Reports intermittent waves of constipation that resolve when he increases the fiber in his diet.   Nausea/Vomiting, if any: denies  Pain issues, if any:  Reports chronic low back pain. Reports having two back surgeries and putting off a third until he can no longer stand the pain.   SAFETY ISSUES:  Prior radiation? denies  Pacemaker/ICD? denies  Possible current pregnancy? no, male patient  Is the patient on methotrexate? no  Current Complaints / other details:  74 year old male. Widowed.   Patient's wife died of breast cancer. Patient believes hormone therapy contributed to his wife's death thus he is "a little bit nervous about hormone therapy."

## 2019-09-06 ENCOUNTER — Encounter: Payer: Self-pay | Admitting: Radiation Oncology

## 2019-09-06 ENCOUNTER — Ambulatory Visit
Admission: RE | Admit: 2019-09-06 | Discharge: 2019-09-06 | Disposition: A | Discharge status: Home / Self Care | Payer: PPO | Source: Ambulatory Visit | Attending: Radiation Oncology | Admitting: Radiation Oncology

## 2019-09-06 ENCOUNTER — Encounter: Payer: Self-pay | Admitting: Urology

## 2019-09-06 ENCOUNTER — Other Ambulatory Visit: Payer: Self-pay

## 2019-09-06 VITALS — Ht 71.0 in | Wt 263.0 lb

## 2019-09-06 DIAGNOSIS — Z8042 Family history of malignant neoplasm of prostate: Secondary | ICD-10-CM | POA: Diagnosis not present

## 2019-09-06 DIAGNOSIS — C61 Malignant neoplasm of prostate: Secondary | ICD-10-CM | POA: Diagnosis not present

## 2019-09-06 DIAGNOSIS — R972 Elevated prostate specific antigen [PSA]: Secondary | ICD-10-CM | POA: Diagnosis not present

## 2019-09-06 HISTORY — DX: Malignant neoplasm of prostate: C61

## 2019-09-06 NOTE — Progress Notes (Signed)
Radiation Oncology         [phone] ________________________________  Initial outpatient Consultation - Conducted via Telephone due to current COVID-19 concerns for limiting patient exposure  Name: Kenneth Lee MRN: [id number]  Date: 09/06/2019  DOB: 13-Jul-1945  [id number], Jori Moll, MD  Robley Fries, MD   REFERRING PHYSICIAN: Robley Fries, MD  DIAGNOSIS: 74 y.o. gentleman with Stage T2a adenocarcinoma of the prostate with Gleason score of 4+3, and PSA of 4.57.    ICD-10-CM   1. Malignant neoplasm of prostate [facility])  C61     HISTORY OF PRESENT ILLNESS: Kenneth Lee is a 74 y.o. male with a diagnosis of prostate cancer. He was noted to have an elevated PSA of 4.57 by his primary care physician, Dr. Delfina Redwood.  Accordingly, he was referred for evaluation in urology by Dr. Claudia Desanctis on 07/15/2019,  digital rectal examination was performed at that time revealing a 4 mm right apex prostate nodule.  The patient proceeded to transrectal ultrasound with 12 biopsies of the prostate on 08/09/2019.  The prostate volume measured 41 cc.  Out of 12 core biopsies, 4 were positive.  The maximum Gleason score was 4+3, and this was seen in the left mid lateral and left apex lateral, both with perineural invasion. Additionally, Gleason 3+4 was seen in the left mid, and a small focus of Gleason 3+3 in right apex.  He underwent staging scans on 09/03/2019. CT A/P was without evidence of metastatic disease and noted a stable benign right adrenal adenoma. Bone scan that same day showed degenerative-type uptake and no definite evidence of osseous metastatic disease.  The patient reviewed the biopsy results with his urologist and he has kindly been referred today for discussion of potential radiation treatment options.  Of note, the patient's PMH is significant for PVD, CVD, HTN and DM Type II. He is not on any chronic anticoagulation.  PREVIOUS RADIATION THERAPY: No  PAST MEDICAL HISTORY:  Past Medical  History:  Diagnosis Date  . Arthritis   . Carotid disease, bilateral ([place])   . Chronic back pain    stenosis  . Coronary artery disease    with bare mental stent 2008 and RCA is Card   Dr.Smith  . Diabetes mellitus without complication ([place])    takes Metformin daily  . Headache    occasionally  . Heart murmur    as a teenager, no problems as an adult  . History of colon polyps    benign  . Hyperlipidemia    takes Crestor daily  . Hypertension    takes Amlodipine,Bystolic,Apresoline,and Lisinopril daily.   . Joint pain   . Other specified cardiac dysrhythmias(427.89)   . Peripheral edema    takes Lasix daily  . Peripheral neuropathy   . Prostate cancer ([place])   . Sinoatrial node dysfunction ([facility])   . Sleep apnea    uses cpap  . Weakness    numbness and tingling,left leg      PAST SURGICAL HISTORY: Past Surgical History:  Procedure Laterality Date  . ABDOMINAL AORTOGRAM N/A 12/29/2017   Procedure: ABDOMINAL AORTOGRAM;  Surgeon: Angelia Mould, MD;  Location: [facility];  Service: Cardiovascular;  Laterality: N/A;  . aortobifemoral bypass  1996  . BACK SURGERY  2016 and 2017   lumbar fusion  . CARDIAC CATHETERIZATION  2008  . CHOLECYSTECTOMY  2001  . COLONOSCOPY    . CORONARY ANGIOPLASTY     1 stent  . discectomy  20 yrs  ago  . ESOPHAGOGASTRODUODENOSCOPY    . EYE SURGERY Right 2018   cataract surgery with lens implant  . FEMORAL-POPLITEAL BYPASS GRAFT Left 02/09/2018   Procedure: LEFT COMMON FEMORAL ENDARTERECTOMY WITH VEIN PATCH ANGIOPLASTY REVISION DISTAL LEFT LIMB OF AORTOFEMORAL BYPASS GRAFT;  Surgeon: Angelia Mould, MD;  Location: [facility];  Service: Vascular;  Laterality: Left;  . LOWER EXTREMITY ANGIOGRAPHY Bilateral 12/29/2017   Procedure: Lower Extremity Angiography;  Surgeon: Angelia Mould, MD;  Location: [facility];  Service: Cardiovascular;  Laterality: Bilateral;    FAMILY HISTORY:  Family History  Problem Relation  Age of Onset  . Heart disease Mother   . Cancer Mother        type unknown  . Heart attack Mother   . Hypertension Mother   . Hypertension Father   . Hypertension Other   . Hyperlipidemia Other   . Prostate cancer Brother   . Stroke Neg Hx   . Breast cancer Neg Hx   . Pancreatic cancer Neg Hx   . Colon cancer Neg Hx     SOCIAL HISTORY:  Social History   Socioeconomic History  . Marital status: Single    Spouse name: Not on file  . Number of children: 2  . Years of education: Not on file  . Highest education level: Not on file  Occupational History  . Not on file  Tobacco Use  . Smoking status: Former Smoker    Packs/day: 1.50    Years: 30.00    Pack years: 45.00    Types: Cigarettes    Quit date: 01/24/2018    Years since quitting: 1.6  . Smokeless tobacco: Never Used  Substance and Sexual Activity  . Alcohol use: Yes    Comment: socially  . Drug use: No  . Sexual activity: Not Currently  Other Topics Concern  . Not on file  Social History Narrative  . Not on file   Social Determinants of Health   Financial Resource Strain:   . Difficulty of Paying Living Expenses: Not on file  Food Insecurity:   . Worried About Charity fundraiser in the Last Year: Not on file  . Ran Out of Food in the Last Year: Not on file  Transportation Needs:   . Lack of Transportation (Medical): Not on file  . Lack of Transportation (Non-Medical): Not on file  Physical Activity:   . Days of Exercise per Week: Not on file  . Minutes of Exercise per Session: Not on file  Stress:   . Feeling of Stress : Not on file  Social Connections:   . Frequency of Communication with Friends and Family: Not on file  . Frequency of Social Gatherings with Friends and Family: Not on file  . Attends Religious Services: Not on file  . Active Member of Clubs or Organizations: Not on file  . Attends Archivist Meetings: Not on file  . Marital Status: Not on file  Intimate Partner Violence:    . Fear of Current or Ex-Partner: Not on file  . Emotionally Abused: Not on file  . Physically Abused: Not on file  . Sexually Abused: Not on file    ALLERGIES: Penicillins  MEDICATIONS:  Current Outpatient Medications  Medication Sig Dispense Refill  . amLODipine (NORVASC) 5 MG tablet TAKE 1 TABLET BY MOUTH EVERY DAY 90 tablet 3  . aspirin 325 MG tablet Take 325 mg by mouth daily.    Marland Kitchen BYSTOLIC 5 MG tablet  TAKE 1 TABLET BY MOUTH EVERY DAY 90 tablet 2  . fluticasone (FLONASE) 50 MCG/ACT nasal spray Place 2 sprays into both nostrils at bedtime.   5  . furosemide (LASIX) 40 MG tablet TAKE 1 TABLET BY MOUTH EVERY DAY 90 tablet 3  . gabapentin (NEURONTIN) 300 MG capsule Take 300 mg by mouth 3 (three) times daily.     . hydrALAZINE (APRESOLINE) 25 MG tablet Take 1 tablet (25 mg total) by mouth 3 (three) times daily. 270 tablet 3  . lisinopril (ZESTRIL) 20 MG tablet TAKE 1 TABLET BY MOUTH 2 TIMES DAILY. . 180 tablet 3  . meloxicam (MOBIC) 15 MG tablet Take 15 mg by mouth daily.  7  . metFORMIN (GLUCOPHAGE) 1000 MG tablet Take 1,000 mg by mouth 2 (two) times daily with a meal.     . Multiple Vitamin (MULTIVITAMIN WITH MINERALS) TABS tablet Take 1 tablet by mouth daily.    . Naphazoline HCl (CLEAR EYES OP) Place 1 drop into both eyes daily.    . Omega-3 Fatty Acids (FISH OIL) 1000 MG CAPS Take 1,000 mg by mouth daily.     . rosuvastatin (CRESTOR) 10 MG tablet TAKE 1 TABLET BY MOUTH EVERY DAY 90 tablet 3  . spironolactone (ALDACTONE) 25 MG tablet TAKE 1 TABLET BY MOUTH EVERY DAY 90 tablet 3  . clindamycin (CLEOCIN) 150 MG capsule TAKE 4 CAPSULES BY MOUTH 1 HOUIR PRIOR TO APPOINTMENT    . nitroGLYCERIN (NITROSTAT) 0.4 MG SL tablet Place 0.4 mg under the tongue every 5 (five) minutes as needed for chest pain.     No current facility-administered medications for this encounter.    REVIEW OF SYSTEMS:  On review of systems, the patient reports that he is doing well overall. He denies any chest  pain, shortness of breath, cough, fevers, chills, night sweats, unintended weight changes. He denies any bowel disturbances, and denies abdominal pain, nausea or vomiting. He denies any new musculoskeletal or joint aches or pains. His IPSS was 2, indicating mild urinary symptoms. He does note some intermittent constipation, relieved with increase in fiber consumption. His SHIM was 3 due to inactivity. A complete review of systems is obtained and is otherwise negative.    PHYSICAL EXAM:  Wt Readings from Last 3 Encounters:  09/06/19 263 lb (119.3 kg)  07/24/19 267 lb (121.1 kg)  06/14/19 266 lb 6.4 oz (120.8 kg)   Temp Readings from Last 3 Encounters:  07/24/19 97.8 F (36.6 C)  09/05/18 (!) 97.2 F (36.2 C) (Oral)  02/26/18 97.8 F (36.6 C) (Oral)   BP Readings from Last 3 Encounters:  07/24/19 116/62  06/14/19 (!) 144/72  11/15/18 (!) 156/58   Pulse Readings from Last 3 Encounters:  07/24/19 (!) 50  06/14/19 (!) 51  11/15/18 (!) 52   Pain Assessment Pain Score: 2  Pain Frequency: Constant Pain Loc: Back/10  Physical exam not performed in light of telephone consult visit format.   KPS = 90  100 - Normal; no complaints; no evidence of disease. 90   - Able to carry on normal activity; minor signs or symptoms of disease. 80   - Normal activity with effort; some signs or symptoms of disease. 49   - Cares for self; unable to carry on normal activity or to do active work. 60   - Requires occasional assistance, but is able to care for most of his personal needs. 50   - Requires considerable assistance and frequent medical care. 40   -  Disabled; requires special care and assistance. 73   - Severely disabled; hospital admission is indicated although death not imminent. 68   - Very sick; hospital admission necessary; active supportive treatment necessary. 10   - Moribund; fatal processes progressing rapidly. 0     - Dead  Karnofsky DA, Abelmann [place], Craver LS and Burchenal [facility]  [phone]) The use of the nitrogen mustards in the palliative treatment of carcinoma: with particular reference to bronchogenic carcinoma Cancer 1 634-56  LABORATORY DATA:  Lab Results  Component Value Date   WBC 13.6 (H) 02/10/2018   HGB 11.7 (L) 02/10/2018   HCT 36.1 (L) 02/10/2018   MCV 99.4 02/10/2018   PLT 168 02/10/2018   Lab Results  Component Value Date   NA 139 02/10/2018   K 4.8 02/10/2018   CL 108 02/10/2018   CO2 23 02/10/2018   Lab Results  Component Value Date   ALT 26 01/31/2018   AST 26 01/31/2018   ALKPHOS 81 01/31/2018   BILITOT 0.7 01/31/2018     RADIOGRAPHY: NM Bone Scan Whole Body  Result Date: 09/04/2019 CLINICAL DATA:  Prostate cancer, PSA 4.57, question osseous metastasis EXAM: NUCLEAR MEDICINE WHOLE BODY BONE SCAN TECHNIQUE: Whole body anterior and posterior images were obtained approximately 3 hours after intravenous injection of radiopharmaceutical. RADIOPHARMACEUTICALS:  21.9 mCi Technetium-52m MDP IV COMPARISON:  None Radiographic correlation: CT abdomen and pelvis 09/03/2019 FINDINGS: Uptake identified at multiple costovertebral junctions at the thoracic spine, degenerative. Uptake in lumbar spine at L1-L2 and L2-L3 corresponding to degenerative and surgical changes. Significant uptake at LEFT L5-S1 corresponding to advanced facet degenerative changes on CT. Uptake at shoulders, sternoclavicular joints, and wrists, less at knees, typically degenerative. Asymmetric appearance of the anterior aspects of the iliac wings identified on anterior view, LEFT more prominent than RIGHT, felt to be related to pelvic rotation rather than a metastatic lesion; no corresponding CT changes at this site. Expected urinary tract and soft tissue distribution of tracer. IMPRESSION: Multiple sites of degenerative type uptake as above. No definite scintigraphic evidence of osseous metastatic disease. Electronically Signed   By: Lavonia [name] M.D.   On: 09/04/2019 07:36       IMPRESSION/PLAN: This visit was conducted via Telephone to spare the patient unnecessary potential exposure in the healthcare setting during the current COVID-19 pandemic. 1. 74 y.o. gentleman with Stage T2a adenocarcinoma of the prostate with Gleason Score of 4+3, and PSA of 4.57. We discussed the patient's workup and outlined the nature of prostate cancer in this setting. The patient's T stage, Gleason's score, and PSA put him into the unfavorable intermediate risk group. Accordingly, he is eligible for a variety of potential treatment options including brachytherapy, 5.5 weeks of external radiation or prostatectomy. We discussed the available radiation techniques, and focused on the details and logistics of delivery. We discussed and outlined the risks, benefits, short and long-term effects associated with radiotherapy and compared and contrasted these with prostatectomy. We discussed the role of SpaceOAR in reducing the rectal toxicity associated with radiotherapy. He was encouraged to ask questions that were answered to his stated satisfaction. He appears to have a good understanding of his disease and our treatment recommendations which are of curative intent.  At the end of the conversation, the patient is most interested in moving forward with brachytherapy and use of SpaceOAR to reduce rectal toxicity from radiotherapy.  We will share our discussion with Dr. Claudia Desanctis and move forward with scheduling his [facility] planning appointment in the  near future. We will also reach out to his cardiologist, Dr. Daneen Schick, and his vascular surgeon, Dr. [name], to obtain medical/cardiac clearance prior to moving forward with brachytherapy scheduling. Once clearance is obtained, the patient will be contacted by Romie Jumper in our office who will be working closely with him to coordinate OR scheduling and pre and post procedure appointments.  We will contact the pharmaceutical rep to ensure that [place] is available  at the time of procedure.  He will have a prostate MRI following his post-seed CT SIM to confirm appropriate distribution of the [place]. If for any reason he is deemed to not be a good candidate for the outpatient brachytherapy procedure, he is agreeable to proceeding with 5.5 weeks of external beam radiation.    Given current concerns for patient exposure during the COVID-19 pandemic, this encounter was conducted via telephone. The patient was notified in advance and was offered a MyChart meeting to allow for face to face communication but unfortunately reported that he did not have the appropriate resources/technology to support such a visit and instead preferred to proceed with telephone consult. The patient has given verbal consent for this type of encounter. The time spent during this encounter was 70 minutes. The attendants for this meeting include Tyler Pita MD, Ashlyn Bruning PA-C, [place], and patient, Kenneth Lee. During the encounter, Tyler Pita MD, Ashlyn Bruning PA-C, and scribe, Wilburn Mylar were located at [employer].  Patient, Kenneth Lee was located at home.    Nicholos Johns, PA-C    Tyler Pita, MD  [facility] Direct Dial: [phone]  Fax: [phone] [email or web address]  Skype  LinkedIn  This document serves as a record of services personally performed by Tyler Pita, MD and Freeman Caldron, PA-C. It was created on their behalf by Wilburn Mylar, a trained medical scribe. The creation of this record is based on the scribe's personal observations and the provider's statements to them. This document has been checked and approved by the attending provider.

## 2019-09-06 NOTE — Progress Notes (Signed)
See progress note under physician encounter. 

## 2019-09-09 ENCOUNTER — Encounter: Payer: Self-pay | Admitting: Interventional Cardiology

## 2019-09-09 ENCOUNTER — Telehealth: Payer: Self-pay | Admitting: Interventional Cardiology

## 2019-09-09 NOTE — Telephone Encounter (Signed)
Error

## 2019-09-09 NOTE — Telephone Encounter (Signed)
Dr. Tamala Julian to review, can Mr. Hargreaves come off of aspirin for 5-7 days prior to prostate implant? Please send your response to P CV DIV PREOP

## 2019-09-09 NOTE — Telephone Encounter (Signed)
   [facility] Pre-operative Risk Assessment    Request for surgical clearance:  1. What type of surgery is being performed? Prostate implant with spacer placement   2. When is this surgery scheduled? TBD  3. What type of clearance is required (medical clearance vs. Pharmacy clearance to hold med vs. Both)? Both  4. Are there any medications that need to be held prior to surgery and how long? 5 to 7 days on blood thinners  5. Practice name and name of physician performing surgery? Dr. Arnette Schaumann, [facility]   6. What is your office phone number [phone]   7.   What is your office fax number [phone]  8.   Anesthesia type (None, local, MAC, general) ? General    Kenneth Lee 09/09/2019, 2:40 PM  _________________________________________________________________   (provider comments below)

## 2019-09-10 ENCOUNTER — Telehealth: Payer: Self-pay

## 2019-09-10 NOTE — Telephone Encounter (Signed)
-----   Message from Angelia Mould, MD sent at 09/10/2019  7:18 AM EST ----- Regarding: RE: Surgical Clearance Contact: [phone] Yes. Thanks CD ----- Message ----- From: Kaleen Mask, LPN Sent: [id number]   4:30 PM EST To: Angelia Mould, MD Subject: Surgical Clearance                             Enid Derry from Dr. Katharina Caper office called wanting to know if this patient is cleared from a Vascular Standpoint to have a prostate procedure/implant?  Please advise.  Thanks,  Thurston Hole., LPN

## 2019-09-11 ENCOUNTER — Telehealth: Payer: Self-pay | Admitting: *Deleted

## 2019-09-11 DIAGNOSIS — H93A9 Pulsatile tinnitus, unspecified ear: Secondary | ICD-10-CM

## 2019-09-11 NOTE — Telephone Encounter (Signed)
It is fine to hold aspirin for 5 days prior to surgery.

## 2019-09-11 NOTE — Telephone Encounter (Signed)
Spoke with pt and reviewed information.  Pt would like a referral to ENT.  Referral placed to Dr. Wilburn Cornelia.

## 2019-09-11 NOTE — Telephone Encounter (Signed)
I reached out to the pt per Almyra Deforest, [facility] in regards to Carotid results that was done 06/2019. Almyra Deforest, APC had s/w pt in regards to his clearance for an upcoming procedure with [facility], pt is aware he has been cleared for procedure with [facility]. While PA was speaking with the pt, the pt had inquired about the carotid results from 06/2019 and that he has been hearing a pulsating sound in his left ear. Phone connection was not very good between PA and pt. PA asked for me to reach to the pt.   I s/w the pt and went over the results from carotids 06/2019 and recommendations to repeat carotids in 1 yr. Pt states since he had the carotid u/s he has been hearing a pulsating sound in his left ear only. I did ask pt how was his BP. He states his BP has been doing fine. He did answer yes to my question if he hears the sound in his ear more so when he lays down. Pt did tell me just now that he can hear right now in his ear and he is not laying down. I assured the pt that I will send my note to Dr. Tamala Julian and his nurse for further recommendations. I advised the pt at this time let's follow the recommendations from Dr. Tamala Julian. Pt is grateful for the call and is agreeable to plan of care.

## 2019-09-11 NOTE — Telephone Encounter (Signed)
   Primary Cardiologist: Sinclair Grooms, MD  Chart reviewed as part of pre-operative protocol coverage. Patient was contacted 09/11/2019 in reference to pre-operative risk assessment for pending surgery as outlined below.  Kenneth Lee was last seen on 06/14/2019 by Dr. Tamala Julian.  Since that day, Kenneth Lee has done well without significant chest pain or shortness of breath.  Therefore, based on ACC/AHA guidelines, the patient would be at acceptable risk for the planned procedure without further cardiovascular testing.   I will route this recommendation to the requesting party via Epic fax function and remove from pre-op pool.  Please call with questions. He may hold aspirin for 5 days prior to the procedure and restart as soon as possible afterward.   [place], [place] 09/11/2019, 2:39 PM

## 2019-09-11 NOTE — Telephone Encounter (Signed)
This is known as pulsatile tinnitus.  There is not much she can be done about it.  Perhaps referral to an ENT specialist would be helpful if it is intolerable.

## 2019-09-17 ENCOUNTER — Ambulatory Visit: Payer: PPO | Attending: Internal Medicine

## 2019-09-17 DIAGNOSIS — Z23 Encounter for immunization: Secondary | ICD-10-CM | POA: Insufficient documentation

## 2019-09-17 NOTE — Progress Notes (Signed)
   Covid-19 Vaccination Clinic  Name:  Kenneth Lee    MRN: [id number] DOB: 1945-07-31  09/17/2019  Mr. Rauschenberger was observed post Covid-19 immunization for 15 minutes without incident. He was provided with Vaccine Information Sheet and instruction to access the V-Safe system.   Mr. Schueneman was instructed to call 911 with any severe reactions post vaccine: Marland Kitchen Difficulty breathing  . Swelling of face and throat  . A fast heartbeat  . A bad rash all over body  . Dizziness and weakness   Immunizations Administered    Name Date Dose VIS Date Route   Pfizer COVID-19 Vaccine 09/17/2019  2:25 PM 0.3 mL 06/21/2019 Intramuscular   Manufacturer: [place]   Lot: [id number]   [place]: [id number]

## 2019-09-24 ENCOUNTER — Encounter: Payer: Self-pay | Admitting: Medical Oncology

## 2019-09-24 ENCOUNTER — Telehealth: Payer: Self-pay | Admitting: *Deleted

## 2019-09-24 ENCOUNTER — Telehealth: Payer: Self-pay | Admitting: Medical Oncology

## 2019-09-24 NOTE — Progress Notes (Signed)
Spoke with patient to let him know we have received clearance from his cardiologist and vascular surgeon, to proceed with surgery. I informed him Kenneth Lee will be contacting him to schedule appointments. He was very appreciative and voiced understanding.

## 2019-09-24 NOTE — Telephone Encounter (Signed)
CALLED PATIENT TO ASK QUESTIONS, LVM FOR A RETURN CALL 

## 2019-09-24 NOTE — Telephone Encounter (Signed)
Called patient to introduce myself as the prostate nurse navigator and discuss my role. I was unable to meet him 2/26, when he consulted with Dr. Tammi Klippel. He has chosen brachytherapy. He had to get cardiac and vascular clearance prior to procedure. I gave him my contact information and asked him to call me with questions or concerns. He voiced understanding.

## 2019-10-02 ENCOUNTER — Other Ambulatory Visit: Payer: Self-pay | Admitting: Urology

## 2019-10-03 ENCOUNTER — Other Ambulatory Visit: Payer: Self-pay | Admitting: Urology

## 2019-10-04 DIAGNOSIS — H6983 Other specified disorders of Eustachian tube, bilateral: Secondary | ICD-10-CM | POA: Insufficient documentation

## 2019-10-04 DIAGNOSIS — H906 Mixed conductive and sensorineural hearing loss, bilateral: Secondary | ICD-10-CM | POA: Diagnosis not present

## 2019-10-04 DIAGNOSIS — H9319 Tinnitus, unspecified ear: Secondary | ICD-10-CM | POA: Insufficient documentation

## 2019-10-04 DIAGNOSIS — H9313 Tinnitus, bilateral: Secondary | ICD-10-CM | POA: Diagnosis not present

## 2019-10-30 ENCOUNTER — Telehealth: Payer: Self-pay | Admitting: *Deleted

## 2019-10-30 NOTE — Telephone Encounter (Signed)
CALLED PATIENT TO REMIND OF PRE-SEED APPTS. FOR 10-31-19, LVM FOR A RETURN CALL

## 2019-10-31 ENCOUNTER — Ambulatory Visit (HOSPITAL_COMMUNITY)
Admission: RE | Admit: 2019-10-31 | Discharge: 2019-10-31 | Disposition: A | Discharge status: Home / Self Care | Payer: PPO | Source: Ambulatory Visit | Attending: Urology | Admitting: Urology

## 2019-10-31 ENCOUNTER — Other Ambulatory Visit: Payer: Self-pay | Admitting: Urology

## 2019-10-31 ENCOUNTER — Encounter: Payer: Self-pay | Admitting: Medical Oncology

## 2019-10-31 ENCOUNTER — Other Ambulatory Visit: Payer: Self-pay

## 2019-10-31 ENCOUNTER — Ambulatory Visit
Admission: RE | Admit: 2019-10-31 | Discharge: 2019-10-31 | Disposition: A | Discharge status: Home / Self Care | Payer: PPO | Source: Ambulatory Visit | Attending: Urology | Admitting: Urology

## 2019-10-31 ENCOUNTER — Encounter (HOSPITAL_COMMUNITY)
Admission: RE | Admit: 2019-10-31 | Discharge: 2019-10-31 | Disposition: A | Discharge status: Home / Self Care | Payer: PPO | Source: Ambulatory Visit | Attending: Urology | Admitting: Urology

## 2019-10-31 ENCOUNTER — Ambulatory Visit
Admission: RE | Admit: 2019-10-31 | Discharge: 2019-10-31 | Disposition: A | Discharge status: Home / Self Care | Payer: PPO | Source: Ambulatory Visit | Attending: Radiation Oncology | Admitting: Radiation Oncology

## 2019-10-31 DIAGNOSIS — C61 Malignant neoplasm of prostate: Secondary | ICD-10-CM

## 2019-10-31 DIAGNOSIS — Z01818 Encounter for other preprocedural examination: Secondary | ICD-10-CM | POA: Insufficient documentation

## 2019-10-31 NOTE — Progress Notes (Signed)
  Radiation Oncology         [phone]) [phone] ________________________________  Name: Kenneth Lee MRN: [id number]  Date: 10/31/2019  DOB: [birth year]  SIMULATION AND TREATMENT PLANNING NOTE PUBIC ARCH STUDY  [id number], Jori Moll, MD  Robley Fries, MD  DIAGNOSIS: 74 y.o. gentleman with Stage T2a adenocarcinoma of the prostate with Gleason score of 4+3, and PSA of 4.57 Oncology History  Malignant neoplasm of prostate ([place])  08/09/2019 Cancer Staging   Staging form: Prostate, AJCC 8th Edition - Clinical stage from 08/09/2019: Stage IIC (cT2a, cN0, cM0, PSA: 4.6, Grade Group: 3) - Signed by Freeman Caldron, PA-C on 09/06/2019   09/06/2019 Initial Diagnosis   Malignant neoplasm of prostate ([place])       ICD-10-CM   1. Malignant neoplasm of prostate ([place])  C61     COMPLEX SIMULATION:  The patient presented today for evaluation for possible prostate seed implant. He was brought to the radiation planning suite and placed supine on the CT couch. A 3-dimensional image study set was obtained in upload to the planning computer. There, on each axial slice, I contoured the prostate gland. Then, using three-dimensional radiation planning tools I reconstructed the prostate in view of the structures from the transperineal needle pathway to assess for possible pubic arch interference. In doing so, I did not appreciate any pubic arch interference. Also, the patient's prostate volume was estimated based on the drawn structure. The volume was 41 cc.  Given the pubic arch appearance and prostate volume, patient remains a good candidate to proceed with prostate seed implant. Today, he freely provided informed written consent to proceed.    PLAN: The patient will undergo prostate seed implant.   ________________________________  Sheral Apley. Tammi Klippel, M.D.

## 2019-11-12 ENCOUNTER — Other Ambulatory Visit: Payer: Self-pay | Admitting: Internal Medicine

## 2019-11-12 ENCOUNTER — Ambulatory Visit
Admission: RE | Admit: 2019-11-12 | Discharge: 2019-11-12 | Disposition: A | Discharge status: Home / Self Care | Payer: PPO | Source: Ambulatory Visit | Attending: Internal Medicine | Admitting: Internal Medicine

## 2019-11-12 DIAGNOSIS — I739 Peripheral vascular disease, unspecified: Secondary | ICD-10-CM | POA: Diagnosis not present

## 2019-11-12 DIAGNOSIS — N403 Nodular prostate with lower urinary tract symptoms: Secondary | ICD-10-CM | POA: Diagnosis not present

## 2019-11-12 DIAGNOSIS — G629 Polyneuropathy, unspecified: Secondary | ICD-10-CM | POA: Diagnosis not present

## 2019-11-12 DIAGNOSIS — R52 Pain, unspecified: Secondary | ICD-10-CM

## 2019-11-12 DIAGNOSIS — C61 Malignant neoplasm of prostate: Secondary | ICD-10-CM | POA: Diagnosis not present

## 2019-11-12 DIAGNOSIS — M7989 Other specified soft tissue disorders: Secondary | ICD-10-CM | POA: Diagnosis not present

## 2019-11-12 DIAGNOSIS — R3912 Poor urinary stream: Secondary | ICD-10-CM | POA: Diagnosis not present

## 2019-11-12 DIAGNOSIS — M25571 Pain in right ankle and joints of right foot: Secondary | ICD-10-CM | POA: Diagnosis not present

## 2019-11-14 ENCOUNTER — Encounter: Payer: Self-pay | Admitting: Medical Oncology

## 2019-11-19 ENCOUNTER — Other Ambulatory Visit: Payer: Self-pay

## 2019-11-19 ENCOUNTER — Encounter (HOSPITAL_BASED_OUTPATIENT_CLINIC_OR_DEPARTMENT_OTHER): Payer: Self-pay | Admitting: Urology

## 2019-11-19 NOTE — Progress Notes (Addendum)
Addendum: spoke with Konrad Felix pa ok to proceed.  Spoke w/ via phone for pre-op interview---patient Lab needs dos----  none            Lab results------has lab appt 11-22-2019@1000  am for cbc, cmet, pt, ptt COVID test ------11-22-2019@1105  am Arrive at -------1100 am 11-26-2019 No food after midnight, clear liquids from midnight until 700 am then npo Medications to take morning of surgery -----gabapentin, rosuvastatin, hydrazaline, bystolic, amlodipine, eye drop, tamsulosin Diabetic medication ----none day of surgery- Patient Special Instructions -----bring cpap machine mask and tubing and leave in car Pre-Op special Istructions -----fleets enema am of surgery, patient stated he was told he could drink magnesium citrate  1 bottle at 1200 pm day before surgery and clear liquids from 1200 pm day before surgery if unable to do fleets enema am of surgery. Patient verbalized understanding of instructions that were given at this phone interview. Patient denies shortness of breath, chest pain, fever, cough a this phone interview.  Anesthesia : cad, blood thinner Chart to Janett Billow zanetto pa for review  PCP: dr Jori Moll polite Cardiologist :dr Daneen Schick lov 06-14-2019 epic, cardiac clearance note hao meng ha 09-11-2019 epic Chest x-ray :10-31-2019 epic EKG :10-31-2019 epic Echo :05-10-2015 epic Cardiac Cath :  [place] Ram [place] catotid 07-04-2019 epic Sleep Study/ CPAP :cpap set on 11, does not know where last sleep study done 5 yrs ago Fasting Blood Sugar :   Does not check   / odes not check  Blood Sugar - Blood Thinner/ Instructions /Last Dose:n/a ASA / Instructions/ Last Dose : note to stop 325 am aspirin 5 days prior to surgery hao meng pa 09-11-2019 epic  Patient denies shortness of breath, chest pain, fever, and cough at this phone interview.

## 2019-11-20 ENCOUNTER — Telehealth: Payer: Self-pay | Admitting: *Deleted

## 2019-11-20 NOTE — Telephone Encounter (Signed)
CALLED PATIENT TO REMIND OF LAB AND COVID TESTING FOR 11-22-19, SPOKE WITH PATIENT AND HE IS AWARE OF THESE APPTS.

## 2019-11-22 ENCOUNTER — Other Ambulatory Visit: Payer: Self-pay

## 2019-11-22 ENCOUNTER — Other Ambulatory Visit (HOSPITAL_COMMUNITY)
Admission: RE | Admit: 2019-11-22 | Discharge: 2019-11-22 | Disposition: A | Discharge status: Home / Self Care | Payer: PPO | Source: Ambulatory Visit | Attending: Urology | Admitting: Urology

## 2019-11-22 ENCOUNTER — Encounter (HOSPITAL_COMMUNITY)
Admission: RE | Admit: 2019-11-22 | Discharge: 2019-11-22 | Disposition: A | Discharge status: Home / Self Care | Payer: PPO | Source: Ambulatory Visit | Attending: Urology | Admitting: Urology

## 2019-11-22 DIAGNOSIS — Z20822 Contact with and (suspected) exposure to covid-19: Secondary | ICD-10-CM | POA: Insufficient documentation

## 2019-11-22 DIAGNOSIS — Z01812 Encounter for preprocedural laboratory examination: Secondary | ICD-10-CM | POA: Insufficient documentation

## 2019-11-22 LAB — CBC
HCT: 42.4 % (ref 39.0–52.0)
Hemoglobin: 13.8 g/dL (ref 13.0–17.0)
MCH: 31.8 pg (ref 26.0–34.0)
MCHC: 32.5 g/dL (ref 30.0–36.0)
MCV: 97.7 fL (ref 80.0–100.0)
Platelets: 196 10*3/uL (ref 150–400)
RBC: 4.34 MIL/uL (ref 4.22–5.81)
RDW: 13.2 % (ref 11.5–15.5)
WBC: 8.9 10*3/uL (ref 4.0–10.5)
nRBC: 0 % (ref 0.0–0.2)

## 2019-11-22 LAB — COMPREHENSIVE METABOLIC PANEL
ALT: 20 U/L (ref 0–44)
AST: 23 U/L (ref 15–41)
Albumin: 4 g/dL (ref 3.5–5.0)
Alkaline Phosphatase: 73 U/L (ref 38–126)
Anion gap: 11 (ref 5–15)
BUN: 37 mg/dL — ABNORMAL HIGH (ref 8–23)
CO2: 23 mmol/L (ref 22–32)
Calcium: 8.9 mg/dL (ref 8.9–10.3)
Chloride: 101 mmol/L (ref 98–111)
Creatinine, Ser: 1.33 mg/dL — ABNORMAL HIGH (ref 0.61–1.24)
GFR calc Af Amer: >60 mL/min (ref >60)
GFR calc non Af Amer: 52 mL/min — ABNORMAL LOW (ref >60)
Glucose, Bld: 169 mg/dL — ABNORMAL HIGH (ref 70–99)
Potassium: 4.8 mmol/L (ref 3.5–5.1)
Sodium: 135 mmol/L (ref 135–145)
Total Bilirubin: 0.8 mg/dL (ref 0.3–1.2)
Total Protein: 6.8 g/dL (ref 6.5–8.1)

## 2019-11-22 LAB — PROTIME-INR
INR: 1.1 (ref 0.8–1.2)
Prothrombin Time: 13.6 seconds (ref 11.4–15.2)

## 2019-11-22 LAB — APTT: aPTT: 31 seconds (ref 24–36)

## 2019-11-22 LAB — SARS CORONAVIRUS 2 (TAT 6-24 HRS): SARS Coronavirus 2: NEGATIVE

## 2019-11-25 ENCOUNTER — Telehealth: Payer: Self-pay | Admitting: *Deleted

## 2019-11-25 NOTE — Telephone Encounter (Signed)
CALLED PATIENT TO REMIND OF PROCEDURE FOR 11-26-19, SPOKE WITH PATIENT AND HE IS AWARE OF THIS PROCEDURE

## 2019-11-26 ENCOUNTER — Ambulatory Visit (HOSPITAL_BASED_OUTPATIENT_CLINIC_OR_DEPARTMENT_OTHER)
Admission: RE | Admit: 2019-11-26 | Discharge: 2019-11-26 | Disposition: A | Discharge status: Home / Self Care | Payer: PPO | Attending: Urology | Admitting: Urology

## 2019-11-26 ENCOUNTER — Ambulatory Visit (HOSPITAL_COMMUNITY): Payer: PPO

## 2019-11-26 ENCOUNTER — Other Ambulatory Visit: Payer: Self-pay

## 2019-11-26 ENCOUNTER — Ambulatory Visit (HOSPITAL_BASED_OUTPATIENT_CLINIC_OR_DEPARTMENT_OTHER): Payer: PPO | Admitting: Physician Assistant

## 2019-11-26 ENCOUNTER — Encounter (HOSPITAL_BASED_OUTPATIENT_CLINIC_OR_DEPARTMENT_OTHER)
Admission: RE | Disposition: A | Discharge status: Home / Self Care | Payer: Self-pay | Source: Home / Self Care | Attending: Urology

## 2019-11-26 ENCOUNTER — Encounter (HOSPITAL_BASED_OUTPATIENT_CLINIC_OR_DEPARTMENT_OTHER): Payer: Self-pay | Admitting: Urology

## 2019-11-26 DIAGNOSIS — M1990 Unspecified osteoarthritis, unspecified site: Secondary | ICD-10-CM | POA: Insufficient documentation

## 2019-11-26 DIAGNOSIS — N402 Nodular prostate without lower urinary tract symptoms: Secondary | ICD-10-CM | POA: Insufficient documentation

## 2019-11-26 DIAGNOSIS — C61 Malignant neoplasm of prostate: Secondary | ICD-10-CM | POA: Insufficient documentation

## 2019-11-26 DIAGNOSIS — I10 Essential (primary) hypertension: Secondary | ICD-10-CM | POA: Insufficient documentation

## 2019-11-26 DIAGNOSIS — Z01818 Encounter for other preprocedural examination: Secondary | ICD-10-CM

## 2019-11-26 DIAGNOSIS — Z87891 Personal history of nicotine dependence: Secondary | ICD-10-CM | POA: Insufficient documentation

## 2019-11-26 DIAGNOSIS — Z79899 Other long term (current) drug therapy: Secondary | ICD-10-CM | POA: Insufficient documentation

## 2019-11-26 DIAGNOSIS — E119 Type 2 diabetes mellitus without complications: Secondary | ICD-10-CM | POA: Diagnosis not present

## 2019-11-26 DIAGNOSIS — G4730 Sleep apnea, unspecified: Secondary | ICD-10-CM | POA: Diagnosis not present

## 2019-11-26 DIAGNOSIS — Z880 Allergy status to penicillin: Secondary | ICD-10-CM | POA: Diagnosis not present

## 2019-11-26 DIAGNOSIS — I110 Hypertensive heart disease with heart failure: Secondary | ICD-10-CM | POA: Diagnosis not present

## 2019-11-26 DIAGNOSIS — I5033 Acute on chronic diastolic (congestive) heart failure: Secondary | ICD-10-CM | POA: Diagnosis not present

## 2019-11-26 DIAGNOSIS — E785 Hyperlipidemia, unspecified: Secondary | ICD-10-CM | POA: Diagnosis not present

## 2019-11-26 HISTORY — PX: CYSTOSCOPY: SHX5120

## 2019-11-26 HISTORY — DX: Type 2 diabetes mellitus without complications: E11.9

## 2019-11-26 HISTORY — DX: Pain in right ankle and joints of right foot: M25.571

## 2019-11-26 HISTORY — PX: RADIOACTIVE SEED IMPLANT: SHX5150

## 2019-11-26 HISTORY — PX: SPACE OAR INSTILLATION: SHX6769

## 2019-11-26 HISTORY — DX: Gout, unspecified: M10.9

## 2019-11-26 LAB — GLUCOSE, CAPILLARY
Glucose-Capillary: 136 mg/dL — ABNORMAL HIGH (ref 70–99)
Glucose-Capillary: 131 mg/dL — ABNORMAL HIGH (ref 70–99)

## 2019-11-26 SURGERY — INSERTION, RADIATION SOURCE, PROSTATE
Anesthesia: General | Site: Rectum

## 2019-11-26 MED ORDER — SODIUM CHLORIDE (PF) 0.9 % IJ SOLN
INTRAMUSCULAR | Status: DC | PRN
  Administered 2019-11-26: 10 mL via INTRAVENOUS

## 2019-11-26 MED ORDER — EPHEDRINE 5 MG/ML INJ
INTRAVENOUS | Status: AC
  Filled 2019-11-26: qty 10

## 2019-11-26 MED ORDER — FENTANYL CITRATE (PF) 100 MCG/2ML IJ SOLN
INTRAMUSCULAR | Status: AC
  Filled 2019-11-26: qty 2

## 2019-11-26 MED ORDER — ONDANSETRON HCL 4 MG/2ML IJ SOLN
INTRAMUSCULAR | Status: DC | PRN
  Administered 2019-11-26: 4 mg via INTRAVENOUS

## 2019-11-26 MED ORDER — TRAMADOL HCL 50 MG PO TABS: 50.0000 mg | ORAL_TABLET | Freq: Four times a day (QID) | ORAL | 0 refills | Status: DC | PRN

## 2019-11-26 MED ORDER — FENTANYL CITRATE (PF) 100 MCG/2ML IJ SOLN
INTRAMUSCULAR | Status: DC | PRN
  Administered 2019-11-26 (×5): 50 ug via INTRAVENOUS

## 2019-11-26 MED ORDER — ONDANSETRON HCL 4 MG/2ML IJ SOLN
INTRAMUSCULAR | Status: AC
  Filled 2019-11-26: qty 2

## 2019-11-26 MED ORDER — STERILE WATER FOR INJECTION IJ SOLN
INTRAMUSCULAR | Status: DC | PRN
  Administered 2019-11-26: 3 mL

## 2019-11-26 MED ORDER — ONDANSETRON HCL 4 MG/2ML IJ SOLN: 4.0000 mg | Freq: Once | INTRAMUSCULAR | Status: DC | PRN

## 2019-11-26 MED ORDER — OXYCODONE HCL 5 MG PO TABS: 5.0000 mg | ORAL_TABLET | Freq: Once | ORAL | Status: DC | PRN

## 2019-11-26 MED ORDER — EPHEDRINE SULFATE-NACL 50-0.9 MG/10ML-% IV SOSY
PREFILLED_SYRINGE | INTRAVENOUS | Status: DC | PRN
  Administered 2019-11-26 (×2): 5 mg via INTRAVENOUS
  Administered 2019-11-26: 10 mg via INTRAVENOUS
  Administered 2019-11-26 (×2): 5 mg via INTRAVENOUS

## 2019-11-26 MED ORDER — FENTANYL CITRATE (PF) 100 MCG/2ML IJ SOLN: 25.0000 ug | INTRAMUSCULAR | Status: DC | PRN

## 2019-11-26 MED ORDER — DEXAMETHASONE SODIUM PHOSPHATE 10 MG/ML IJ SOLN
INTRAMUSCULAR | Status: DC | PRN
Start: 2019-11-26 — End: 2019-11-26
  Administered 2019-11-26: 8 mg via INTRAVENOUS

## 2019-11-26 MED ORDER — DEXAMETHASONE SODIUM PHOSPHATE 10 MG/ML IJ SOLN
INTRAMUSCULAR | Status: AC
  Filled 2019-11-26: qty 1

## 2019-11-26 MED ORDER — OXYCODONE HCL 5 MG/5ML PO SOLN: 5.0000 mg | Freq: Once | ORAL | Status: DC | PRN

## 2019-11-26 MED ORDER — LACTATED RINGERS IV SOLN: INTRAVENOUS | Status: DC

## 2019-11-26 MED ORDER — PROPOFOL 10 MG/ML IV BOLUS
INTRAVENOUS | Status: AC
  Filled 2019-11-26: qty 20

## 2019-11-26 MED ORDER — FLEET ENEMA 7-19 GM/118ML RE ENEM: 1.0000 | ENEMA | Freq: Once | RECTAL | Status: DC

## 2019-11-26 MED ORDER — IOHEXOL 300 MG/ML  SOLN
INTRAMUSCULAR | Status: DC | PRN
  Administered 2019-11-26: 7 mL

## 2019-11-26 MED ORDER — LIDOCAINE HCL (CARDIAC) PF 100 MG/5ML IV SOSY
PREFILLED_SYRINGE | INTRAVENOUS | Status: DC | PRN
  Administered 2019-11-26: 100 mg via INTRAVENOUS

## 2019-11-26 MED ORDER — SODIUM CHLORIDE 0.9 % IR SOLN
Status: DC | PRN
  Administered 2019-11-26: 200 mL via INTRAVESICAL

## 2019-11-26 MED ORDER — LIDOCAINE 2% (20 MG/ML) 5 ML SYRINGE
INTRAMUSCULAR | Status: AC
  Filled 2019-11-26: qty 5

## 2019-11-26 MED ORDER — PROPOFOL 10 MG/ML IV BOLUS
INTRAVENOUS | Status: DC | PRN
  Administered 2019-11-26: 160 mg via INTRAVENOUS

## 2019-11-26 MED ORDER — CLINDAMYCIN PHOSPHATE 600 MG/50ML IV SOLN
INTRAVENOUS | Status: AC
  Filled 2019-11-26: qty 50

## 2019-11-26 MED ORDER — CLINDAMYCIN PHOSPHATE 600 MG/50ML IV SOLN
600.0000 mg | Freq: Once | INTRAVENOUS | Status: AC
  Administered 2019-11-26: 600 mg via INTRAVENOUS

## 2019-11-26 SURGICAL SUPPLY — 40 items
BAG URINE DRAIN 2000ML AR STRL (UROLOGICAL SUPPLIES) ×5 IMPLANT
BLADE CLIPPER SENSICLIP SURGIC (BLADE) ×5 IMPLANT
CATH COUDE FOLEY 2W 5CC 18FR (CATHETERS) ×5 IMPLANT
CATH FOLEY 2WAY SLVR  5CC 16FR (CATHETERS)
CATH FOLEY 2WAY SLVR 5CC 16FR (CATHETERS) IMPLANT
CATH ROBINSON RED A/P 16FR (CATHETERS) IMPLANT
CATH ROBINSON RED A/P 20FR (CATHETERS) ×5 IMPLANT
CLOTH BEACON ORANGE TIMEOUT ST (SAFETY) ×5 IMPLANT
CNTNR URN SCR LID CUP LEK RST (MISCELLANEOUS) ×6 IMPLANT
CONT SPEC 4OZ STRL OR WHT (MISCELLANEOUS) ×10
COVER BACK TABLE 60X90IN (DRAPES) ×5 IMPLANT
COVER MAYO STAND STRL (DRAPES) ×5 IMPLANT
DRSG TEGADERM 4X4.75 (GAUZE/BANDAGES/DRESSINGS) ×5 IMPLANT
DRSG TEGADERM 8X12 (GAUZE/BANDAGES/DRESSINGS) ×10 IMPLANT
GAUZE SPONGE 4X4 12PLY STRL LF (GAUZE/BANDAGES/DRESSINGS) ×5 IMPLANT
GLOVE BIO SURGEON STRL SZ 6.5 (GLOVE) ×4 IMPLANT
GLOVE BIO SURGEON STRL SZ7.5 (GLOVE) ×5 IMPLANT
GLOVE BIO SURGEON STRL SZ8 (GLOVE) ×10 IMPLANT
GLOVE BIO SURGEONS STRL SZ 6.5 (GLOVE) ×1
GLOVE SURG ORTHO 8.5 STRL (GLOVE) IMPLANT
GLOVE SURG SS PI 6.5 STRL IVOR (GLOVE) IMPLANT
GOWN STRL REUS W/TWL XL LVL3 (GOWN DISPOSABLE) ×5 IMPLANT
HOLDER FOLEY CATH W/STRAP (MISCELLANEOUS) IMPLANT
I-Seed AgX100 ×340 IMPLANT
IMPL SPACEOAR SYSTEM 10ML (Spacer) ×3 IMPLANT
IMPLANT SPACEOAR SYSTEM 10ML (Spacer) ×5 IMPLANT
IV NS 1000ML (IV SOLUTION) ×5
IV NS 1000ML BAXH (IV SOLUTION) ×3 IMPLANT
KIT TURNOVER CYSTO (KITS) ×5 IMPLANT
MARKER SKIN DUAL TIP RULER LAB (MISCELLANEOUS) ×5 IMPLANT
NS IRRIG 500ML POUR BTL (IV SOLUTION) ×5 IMPLANT
SUT BONE WAX W31G (SUTURE) IMPLANT
SYR 10ML LL (SYRINGE) ×5 IMPLANT
SYR TOOMEY IRRIG 70ML (MISCELLANEOUS) ×5
SYRINGE TOOMEY IRRIG 70ML (MISCELLANEOUS) ×3 IMPLANT
TOWEL OR 17X26 10 PK STRL BLUE (TOWEL DISPOSABLE) ×5 IMPLANT
TRAY CYSTO PACK (CUSTOM PROCEDURE TRAY) ×5 IMPLANT
TRAY DSU PREP LF (CUSTOM PROCEDURE TRAY) ×5 IMPLANT
UNDERPAD 30X36 HEAVY ABSORB (UNDERPADS AND DIAPERS) ×10 IMPLANT
WATER STERILE IRR 500ML POUR (IV SOLUTION) ×5 IMPLANT

## 2019-11-26 NOTE — Op Note (Signed)
PATIENT:  Kenneth Lee  PRE-OPERATIVE DIAGNOSIS:  Adenocarcinoma of the prostate  POST-OPERATIVE DIAGNOSIS:  Same  PROCEDURE:  1. I-125 radioactive seed implantation 2. Cystoscopy  3. Placement of SpaceOAR  SURGEON:  Jacalyn Lefevre, MD  Radiation oncologist: Dr. Tyler Pita  ANESTHESIA:  General  EBL:  Minimal  DRAINS: none  INDICATION: Kenneth Lee  Description of procedure: After informed consent the patient was brought to the major OR, placed on the table and administered general anesthesia. He was then moved to the modified lithotomy position with his perineum perpendicular to the floor. His perineum and genitalia were then sterilely prepped. An official timeout was then performed. A 16 French Foley catheter was then placed in the bladder and filled with dilute contrast, a rectal tube was placed in the rectum and the transrectal ultrasound probe was placed in the rectum and affixed to the stand. He was then sterilely draped.  Real time ultrasonography was used along with the seed planning software [employer] Prostate. This was used to develop the seed plan including the number of needles as well as number of seeds required for complete and adequate coverage. Real-time ultrasonography was then used along with the previously developed plan and the Nucletron device to implant a total of 68 seeds using 22 needles. This proceeded without difficulty or complication.   I then proceeded with placement of SpaceOARby introducing a needle with the bevel angled inferiorly approximately 2 cm superior to the anus. This was angled downward and under direct ultrasound was placed within the space between the prostatic capsule and rectum. This was confirmed with a small amount of sterile saline injected and this was performed under direct ultrasound. I then attached the SpaceOARto the needle and injected this in the space between the prostate and rectum with good placement noted.  A  Foley catheter was then removed as well as the transrectal ultrasound probe and rectal probe. Flexible cystoscopy was then performed using the 17 French flexible scope which revealed a normal urethra throughout its length down to the sphincter which appeared intact. The prostatic urethra revealed bilobar hypertrophy but no evidence of obstruction, seeds, spacers or lesions. The bladder was then entered and fully and systematically inspected. The ureteral orifices were noted to be of normal configuration and position. The mucosa revealed no evidence of tumors. There were also no stones identified within the bladder. I noted no seeds or spacers on the floor of the bladder and retroflexion of the scope revealed no seeds protruding from the base of the prostate.  The cystoscope was then removed and the patient was awakened and taken to recovery room in stable and satisfactory condition. He tolerated procedure well and there were no intraoperative complications.

## 2019-11-26 NOTE — Interval H&P Note (Signed)
History and Physical Interval Note:  11/26/2019 12:50 PM  Kenneth Lee  has presented today for surgery, with the diagnosis of PROSTATE CANCER.  The various methods of treatment have been discussed with the patient and family. After consideration of risks, benefits and other options for treatment, the patient has consented to  Procedure(s) with comments: RADIOACTIVE SEED IMPLANT/BRACHYTHERAPY IMPLANT (N/A) - 90 MINS SPACE OAR INSTILLATION (N/A) as a surgical intervention.  The patient's history has been reviewed, patient examined, no change in status, stable for surgery.  I have reviewed the patient's chart and labs.  Questions were answered to the patient's satisfaction.     Tahjir Silveria D Jaisen Wiltrout

## 2019-11-26 NOTE — Anesthesia Procedure Notes (Signed)
Procedure Name: LMA Insertion Date/Time: 11/26/2019 1:03 PM Performed by: Raenette Rover, CRNA Pre-anesthesia Checklist: Patient identified, Emergency Drugs available, Suction available and Patient being monitored Patient Re-evaluated:Patient Re-evaluated prior to induction Oxygen Delivery Method: Circle system utilized Preoxygenation: Pre-oxygenation with 100% oxygen Induction Type: IV induction LMA: LMA inserted LMA Size: 4.0 Number of attempts: 1 Placement Confirmation: positive ETCO2 and breath sounds checked- equal and bilateral Tube secured with: Tape Dental Injury: Teeth and Oropharynx as per pre-operative assessment

## 2019-11-26 NOTE — Transfer of Care (Signed)
Immediate Anesthesia Transfer of Care Note  Patient: Kenneth Lee  Procedure(s) Performed: RADIOACTIVE SEED IMPLANT/BRACHYTHERAPY IMPLANT (N/A Prostate) SPACE OAR INSTILLATION (N/A Rectum) CYSTOSCOPY FLEXIBLE (N/A Bladder)  Patient Location: PACU  Anesthesia Type:General  Level of Consciousness: awake, alert , oriented and patient cooperative  Airway & Oxygen Therapy: Patient Spontanous Breathing and Patient connected to face mask oxygen  Post-op Assessment: Report given to RN and Post -op Vital signs reviewed and stable  Post vital signs: Reviewed and stable  Last Vitals:  Vitals Value Taken Time  BP 133/59 11/26/19 1449  Temp    Pulse 65 11/26/19 1456  Resp 14 11/26/19 1456  SpO2 98 % 11/26/19 1456  Vitals shown include unvalidated device data.  Last Pain:  Vitals:   11/26/19 1130  TempSrc: Oral  PainSc: 3       Patients Stated Pain Goal: 5 ([id number])  Complications: No apparent anesthesia complications

## 2019-11-26 NOTE — H&P (Signed)
CC/HPI: cc: prostate cancer   Gleason 4+3=7 prostate cancer, unfavorable intermediate risk, Grade group 3   08/15/19: 74 year old man referred for an elevated PSA 4.57 found have a prostate nodule on DRE underwent prostate biopsy on 08/06/2019. He returns for results today. He is doing well since the biopsy and without complaint. Pathology report showed Gleason 3 + 3 in 1/12 cores, Gleason 3 + 4 1/12 cores and Gleason 4 + 3 in 2/12 cores (right apex, left apex, and left mid). He denies family hx of CaP or gross hematuria. He has mild LUTs including weak stream and intermittency. He is not on any alpha blockers or alpha reductase inhibitors.    11/12/2019: Here today for pre-op appointment prior to undergoing brachytherapy seed implant and spaceOAR insertion with Dr Chip Boer on 05/18. Previous CT imaging and bone scan showed no metastatic disease.   Today he reports baseline voiding symptoms are stable. He tells me he is not overly bothered by any frequency/urgency or difficulty starting his stream. Endorses rare nocturia. No interval burning, painful urination. No interval gross hematuria, no interval UTI treatment. Denies any recent fevers or chills, nausea/vomiting, or other infection treatment. Past medical history remains unchanged, no interval surgical procedures or changes to prescription medications.     ALLERGIES: penicillin    MEDICATIONS: Crestor 10 mg tablet  Lisinopril 20 mg tablet  Metformin Hcl 1,000 mg tablet  Amlodipine Besylate 5 mg tablet  Bystolic 5 mg tablet  Furosemide 40 mg tablet  Gabapentin 600 mg tablet  Hydralazine Hcl 25 mg tablet  Meloxicam 15 mg tablet  Spironolactone 25 mg tablet     GU PSH: Locm 300-399Mg /Ml Iodine,1Ml - 09/03/2019 Prostate Needle Biopsy - 08/09/2019       PSH Notes: aoritc bypass, coronary stent    NON-GU PSH: Remove Gallbladder Surgical Pathology, Gross And Microscopic Examination For Prostate Needle - 08/09/2019     GU PMH: ED  due to arterial insufficiency, Patient states he responds well to medication. We did discuss that it may worsen with radiation and we can discuss management at happens. - 08/15/2019 Prostate Cancer, The patient and I discussed pathology report as well as Gleason score, risk stratification group, and grade group. The patient is Gleason 4+3=7, unfavorable intermediate risk prostate cancer, grade group 3. Next steps will be staging workup which included CT of abdomen pelvis and bone scan. We also discussed treatment options which include radical prostatectomy +/- adj radiation vs XRT +/- hormone therapy. The patient is not interested in surgery and would like to have a radiation oncology consultation with Dr. Tammi Klippel. I did discuss that often x6 months of androgen deprivation therapy will be used in combination with radiation treatment. We discussed the risks and benefits of hormone therapy. Patient is a little bit nervous as his wife had breast cancer is on hormone therapy for many years and he feels like is what may be had killed her. - 08/15/2019 Weak Urinary Stream (Stable), Discussed adding Flomax his medications at the next visit. - 08/15/2019, Patient is not bothered by his urinary symptoms and not interested in medication at this time., - 07/15/2019 Elevated PSA, We discussed age specific PSA ranges however given patient's physical exam that reveals a prostate nodule I have recommended a prostate biopsy. I discussed the risks and benefits of a prostate biopsy and he was given the appropriate instructions. He has agreed to proceed. - 07/15/2019 Prostate nodule w/ LUTS, DRE revealed prostate nodule and I have recommended a prostate biopsy. -  07/15/2019    NON-GU PMH: Asthma Cardiac murmur, unspecified Diabetes Type 2 Gout Heart disease, unspecified Hypercholesterolemia Hypertension Sleep Apnea    FAMILY HISTORY: Kidney Failure - Father Prostate Cancer - Brother   SOCIAL HISTORY: Marital Status:  Widowed Preferred Language: English; Ethnicity: Not Hispanic Or Latino; Race: White Current Smoking Status: Patient does not smoke anymore. Has not smoked since 07/11/2018.   Tobacco Use Assessment Completed: Used Tobacco in last 30 days? Does not use smokeless tobacco. Social Drinker.  Does not use drugs. Drinks 2 caffeinated drinks per day. Has not had a blood transfusion.    REVIEW OF SYSTEMS:    GU Review Male:   Patient denies frequent urination, hard to postpone urination, burning/ pain with urination, get up at night to urinate, leakage of urine, stream starts and stops, trouble starting your stream, have to strain to urinate , erection problems, and penile pain.  Gastrointestinal (Upper):   Patient denies nausea, vomiting, and indigestion/ heartburn.  Gastrointestinal (Lower):   Patient denies diarrhea and constipation.  Constitutional:   Patient denies fever, night sweats, weight loss, and fatigue.  Skin:   Patient denies skin rash/ lesion and itching.  Eyes:   Patient denies blurred vision and double vision.  Ears/ Nose/ Throat:   Patient denies sinus problems and sore throat.  Hematologic/Lymphatic:   Patient denies swollen glands and easy bruising.  Cardiovascular:   Patient denies leg swelling and chest pains.  Respiratory:   Patient denies cough and shortness of breath.  Endocrine:   Patient denies excessive thirst.  Musculoskeletal:   Patient denies back pain and joint pain.  Neurological:   Patient denies headaches and dizziness.  Psychologic:   Patient denies depression and anxiety.   Notes: Neuropathy in both feet and a bone chip seeing a Dr. today for that.    VITAL SIGNS:      11/12/2019 09:55 AM  Weight 263 lb / 119.29 kg  Height 71 in / 180.34 cm  BP 137/78 mmHg  Pulse 62 /min  Temperature 96.9 F / 36.0 C  BMI 36.7 kg/m   MULTI-SYSTEM PHYSICAL EXAMINATION:    Constitutional: Well-nourished. No physical deformities. Normally developed. Good grooming.   Neck: Neck symmetrical, not swollen. Normal tracheal position.  Respiratory: No labored breathing, no use of accessory muscles.   Cardiovascular: Normal temperature, normal extremity pulses, no swelling, no varicosities.  Neurologic / Psychiatric: Oriented to time, oriented to place, oriented to person. No depression, no anxiety, no agitation.  Gastrointestinal: Obese abdomen. No mass, no tenderness, no rigidity.   Musculoskeletal: Normal gait and station of head and neck.     Complexity of Data:  Source Of History:  Patient, Medical Record Summary  Lab Test Review:   PSA  Records Review:   Pathology Reports, Previous Doctor Records, Previous Hospital Records, Previous Patient Records  Urine Test Review:   Urinalysis  X-Ray Review: C.T. Abdomen/Pelvis: Reviewed Films. Reviewed Report.  Bone Scan: Reviewed Report.     11/12/19  Urinalysis  Urine Appearance CLEAR   Urine Color YELLOW   Urine Glucose NEGATIVE   Urine Bilirubin NEGATIVE   Urine Ketones NEGATIVE   Urine Specific Gravity 1.020   Urine Blood NEGATIVE   Urine pH 5.5   Urine Protein NEGATIVE   Urine Urobilinogen 0.2SE.U./dL mg/dL  Urine Nitrites NEGATIVE   Urine Leukocyte Esterase TRACE   Urine WBC/hpf 0 - 5/hpf   Urine RBC/hpf NS (Not Seen)   Urine Epithelial Cells 0 - 5/hpf   Urine  Bacteria Rare (0-9/hpf)   Urine Mucous Not Present   Urine Yeast NS (Not Seen)   Urine Trichomonas Not Present   Urine Cystals NS (Not Seen)   Urine Casts NS (Not Seen)   Urine Sperm Not Present    PROCEDURES:          Urinalysis w/Scope Micro  WBC/hpf: 0 - 5/hpf  RBC/hpf: NS (Not Seen)  Bacteria: Rare (0-9/hpf)  Cystals: NS (Not Seen)  Casts: NS (Not Seen)  Trichomonas: Not Present  Mucous: Not Present  Epithelial Cells: 0 - 5/hpf  Yeast: NS (Not Seen)  Sperm: Not Present    ASSESSMENT:      ICD-10 Details  1 GU:   Prostate Cancer - C61 Chronic, Stable  2   Prostate nodule w/ LUTS - N40.3 Chronic, Stable  3   Weak  Urinary Stream - R39.12 Chronic, Stable   PLAN:            Medications New Meds: Tamsulosin Hcl 0.4 mg capsule 1 capsule PO Daily   #30  5 Refill(s)

## 2019-11-26 NOTE — Discharge Instructions (Signed)
Post Anesthesia Home Care Instructions  Activity: Get plenty of rest for the remainder of the day. A responsible adult should stay with you for 24 hours following the procedure.  For the next 24 hours, DO NOT: -Drive a car -Paediatric nurse -Drink alcoholic beverages -Take any medication unless instructed by your physician -Make any legal decisions or sign important papers.  Meals: Start with liquid foods such as gelatin or soup. Progress to regular foods as tolerated. Avoid greasy, spicy, heavy foods. If nausea and/or vomiting occur, drink only clear liquids until the nausea and/or vomiting subsides. Call your physician if vomiting continues.  Special Instructions/Symptoms: Your throat may feel dry or sore from the anesthesia or the breathing tube placed in your throat during surgery. If this causes discomfort, gargle with warm salt water. The discomfort should disappear within 24 hours.  If you had a scopolamine patch placed behind your ear for the management of post- operative nausea and/or vomiting:  1. The medication in the patch is effective for 72 hours, after which it should be removed.  Wrap patch in a tissue and discard in the trash. Wash hands thoroughly with soap and water. 2. You may remove the patch earlier than 72 hours if you experience unpleasant side effects which may include dry mouth, dizziness or visual disturbances. 3. Avoid touching the patch. Wash your hands with soap and water after contact with the patch.    Diet Resume your usual diet when you return home. To keep your bowels moving easily and softly, drink prune, apple and cranberry juice at room temperature. You may also take a stool softener, such as Colace, which is available without prescription at local pharmacies. Daily activities ; No driving or heavy lifting for at least two days after the implant. ; No bike riding, horseback riding or riding lawn mowers for the first month after the implant. ; Any  strenuous physical activity should be approved by your doctor before you resume it. Sexual relations You may resume sexual relations two weeks after the procedure. A condom should be used for the first two weeks. Your semen may be dark brown or black; this is normal and is related bleeding that may have occurred during the implant. Postoperative swelling Expect swelling and bruising of the scrotum and perineum (the area between the scrotum and anus). Both the swelling and the bruising should resolve in l or 2 weeks. Ice packs and over- the-counter medications such as Tylenol, Advil or Aleve may lessen your discomfort. Postoperative urination Most men experience burning on urination and/or urinary frequency. If this becomes bothersome, contact your Urologist.  Medication can be prescribed to relieve these problems.  It is normal to have some blood in your urine for a few days after the implant. Special instructions related to the seeds It is unlikely that you will pass an Iodine-125 seed in your urine. The seeds are silver in color and are about as large as a grain of rice. If you pass a seed, do not handle it with your fingers. Use a spoon to place it in an envelope or jar in place this in base occluded area such as the garage or basement for return to the radiation clinic at your convenience.  Contact your doctor for ; Temperature greater than 101 F ; Increasing pain ; Inability to urinate Follow-up  You should have follow up with your urologist and radiation oncologist about 3 weeks after the procedure. General information regarding Iodine seeds ; Iodine-125 is a  low energy radioactive material. It is not deeply penetrating and loses energy at short distances. Your prostate will absorb the radiation. Objects that are touched or used by the patient do not become radioactive. ; Body wastes (urine and stool) or body fluids (saliva, tears, semen or blood) are not radioactive. ; The Nuclear  Regulatory Commission [facility]) has determined that no radiation precautions are needed for patients undergoing Iodine-125 seed implantation. The [facility] states that such patients do not present a risk to the people around them, including young children and pregnant women. However, in keeping with the general principle that radiation exposure should be kept as low reasonably possible, we suggest the following: ; Children and pets should not sit on the patient's lap for the first two (2) weeks after the implant. ; Pregnant (or possibly pregnant) women should avoid prolonged, close contact with the patient for the first two (2) weeks after the implant. ; A distance of three (3) feet is acceptable. ; At a distance of three (3) feet, there is no limit to the length of time anyone can be with the patient.

## 2019-11-26 NOTE — Anesthesia Preprocedure Evaluation (Signed)
Anesthesia Evaluation  Patient identified by MRN, date of birth, ID band Patient awake    Reviewed: Allergy & Precautions, NPO status , Patient's Chart, lab work & pertinent test results  History of Anesthesia Complications Negative for: history of anesthetic complications  Airway Mallampati: III  TM Distance: <3 FB Neck ROM: Full    Dental  (+) Dental Advisory Given   Pulmonary sleep apnea and Continuous Positive Airway Pressure Ventilation , Current Smoker and Patient abstained from smoking.,    Pulmonary exam normal        Cardiovascular hypertension, + CAD, + Cardiac Stents and + Peripheral Vascular Disease  Normal cardiovascular exam  HLD   Neuro/Psych negative neurological ROS  negative psych ROS   GI/Hepatic negative GI ROS, Neg liver ROS,   Endo/Other  diabetes, Type 2  Renal/GU Renal InsufficiencyRenal disease (Cr 1.33)   Prostate cancer    Musculoskeletal  (+) Arthritis ,   Abdominal   Peds  Hematology negative hematology ROS (+)   Anesthesia Other Findings  Myoview 2019: This is a low risk study. No evidence of ischemia identified. Prior bare-metal stent to RCA. EF 55%  Reproductive/Obstetrics                            Anesthesia Physical Anesthesia Plan  ASA: III  Anesthesia Plan: General   Post-op Pain Management:    Induction: Intravenous  PONV Risk Score and Plan: 1 and Ondansetron, Dexamethasone, Midazolam and Treatment may vary due to age or medical condition  Airway Management Planned: LMA  Additional Equipment: None  Intra-op Plan:   Post-operative Plan: Extubation in OR  Informed Consent: I have reviewed the patients History and Physical, chart, labs and discussed the procedure including the risks, benefits and alternatives for the proposed anesthesia with the patient or authorized representative who has indicated his/her understanding and acceptance.      Dental advisory given  Plan Discussed with:   Anesthesia Plan Comments:         Anesthesia Quick Evaluation

## 2019-11-27 ENCOUNTER — Other Ambulatory Visit: Payer: Self-pay | Admitting: Interventional Cardiology

## 2019-11-27 NOTE — Anesthesia Postprocedure Evaluation (Signed)
Anesthesia Post Note  Patient: Kenneth Lee  Procedure(s) Performed: RADIOACTIVE SEED IMPLANT/BRACHYTHERAPY IMPLANT (N/A Prostate) SPACE OAR INSTILLATION (N/A Rectum) CYSTOSCOPY FLEXIBLE (N/A Bladder)     Patient location during evaluation: PACU Anesthesia Type: General Level of consciousness: awake and alert Pain management: pain level controlled Vital Signs Assessment: post-procedure vital signs reviewed and stable Respiratory status: spontaneous breathing, nonlabored ventilation, respiratory function stable and patient connected to nasal cannula oxygen Cardiovascular status: blood pressure returned to baseline and stable Postop Assessment: no apparent nausea or vomiting Anesthetic complications: no    Last Vitals:  Vitals:   11/26/19 1545 11/26/19 1656  BP: (!) 145/52 140/63  Pulse: (!) 52 (!) 59  Resp: 17 16  Temp:  (!) 36.4 C  SpO2: 99% 95%    Last Pain:  Vitals:   11/26/19 1656  TempSrc:   PainSc: 0-No pain                 Tiajuana Amass

## 2019-11-30 NOTE — Progress Notes (Signed)
  Radiation Oncology         [phone]) [phone] ________________________________  Name: DEQUAWN KOZIKOWSKI MRN: [id number]  Date: 11/30/2019  DOB: 06-02-1946       Prostate Seed Implant  [id number], Jori Moll, MD  No ref. provider found  DIAGNOSIS:  Oncology History  Malignant neoplasm of prostate ([place])  08/09/2019 Cancer Staging   Staging form: Prostate, AJCC 8th Edition - Clinical stage from 08/09/2019: Stage IIC (cT2a, cN0, cM0, PSA: 4.6, Grade Group: 3) - Signed by Freeman Caldron, PA-C on 09/06/2019   09/06/2019 Initial Diagnosis   Malignant neoplasm of prostate ([place])       ICD-10-CM   1. Preop testing  Z01.818 DG Chest 2 View    DG Chest 2 View    PROCEDURE: Insertion of radioactive I-125 seeds into the prostate gland.  RADIATION DOSE: 145 Gy, definitive therapy.  TECHNIQUE: LJ SLIMAN was brought to the operating room with the urologist. He was placed in the dorsolithotomy position. He was catheterized and a rectal tube was inserted. The perineum was shaved, prepped and draped. The ultrasound probe was then introduced into the rectum to see the prostate gland.  TREATMENT DEVICE: A needle grid was attached to the ultrasound probe stand and anchor needles were placed.  3D PLANNING: The prostate was imaged in 3D using a sagittal sweep of the prostate probe. These images were transferred to the planning computer. There, the prostate, urethra and rectum were defined on each axial reconstructed image. Then, the software created an optimized 3D plan and a few seed positions were adjusted. The quality of the plan was reviewed using [facility] information for the target and the following two organs at risk:  Urethra and Rectum.  Then the accepted plan was printed and handed off to the radiation therapist.  Under my supervision, the custom loading of the seeds and spacers was carried out and loaded into sealed vicryl sleeves.  These pre-loaded needles were then placed into the needle  holder.Marland Kitchen  PROSTATE VOLUME STUDY:  Using transrectal ultrasound the volume of the prostate was verified to be 47.5 cc.  SPECIAL TREATMENT PROCEDURE/SUPERVISION AND HANDLING: The pre-loaded needles were then delivered under sagittal guidance. A total of 22 needles were used to deposit 68 seeds in the prostate gland. The individual seed activity was 0.500 mCi.  SpaceOAR:  Yes  COMPLEX SIMULATION: At the end of the procedure, an anterior radiograph of the pelvis was obtained to document seed positioning and count. Cystoscopy was performed to check the urethra and bladder.  MICRODOSIMETRY: At the end of the procedure, the patient was emitting 0.090 mR/hr at 1 meter. Accordingly, he was considered safe for hospital discharge.  PLAN: The patient will return to the radiation oncology clinic for post implant CT dosimetry in three weeks.   ________________________________  Sheral Apley Tammi Klippel, M.D.

## 2019-12-07 ENCOUNTER — Other Ambulatory Visit: Payer: Self-pay | Admitting: Interventional Cardiology

## 2019-12-17 ENCOUNTER — Other Ambulatory Visit: Payer: Self-pay | Admitting: Urology

## 2019-12-17 DIAGNOSIS — I10 Essential (primary) hypertension: Secondary | ICD-10-CM | POA: Diagnosis not present

## 2019-12-17 DIAGNOSIS — I739 Peripheral vascular disease, unspecified: Secondary | ICD-10-CM | POA: Diagnosis not present

## 2019-12-17 DIAGNOSIS — E7800 Pure hypercholesterolemia, unspecified: Secondary | ICD-10-CM | POA: Diagnosis not present

## 2019-12-17 DIAGNOSIS — G4730 Sleep apnea, unspecified: Secondary | ICD-10-CM | POA: Diagnosis not present

## 2019-12-17 DIAGNOSIS — E1151 Type 2 diabetes mellitus with diabetic peripheral angiopathy without gangrene: Secondary | ICD-10-CM | POA: Diagnosis not present

## 2019-12-17 DIAGNOSIS — E1165 Type 2 diabetes mellitus with hyperglycemia: Secondary | ICD-10-CM | POA: Diagnosis not present

## 2019-12-17 DIAGNOSIS — I2510 Atherosclerotic heart disease of native coronary artery without angina pectoris: Secondary | ICD-10-CM | POA: Diagnosis not present

## 2019-12-17 DIAGNOSIS — C61 Malignant neoplasm of prostate: Secondary | ICD-10-CM | POA: Diagnosis not present

## 2019-12-17 MED ORDER — ALPRAZOLAM 1 MG PO TABS: 1.0000 mg | ORAL_TABLET | ORAL | 0 refills | Status: DC | PRN

## 2019-12-18 ENCOUNTER — Telehealth: Payer: Self-pay | Admitting: *Deleted

## 2019-12-18 NOTE — Telephone Encounter (Signed)
CALLED PATIENT TO REMIND OF POST SEED APPTS. AND MRI FOR 12-19-19, SPOKE WITH PATIENT AND HE IS AWARE OF THESE APPTS.

## 2019-12-18 NOTE — Telephone Encounter (Signed)
Called patient to inform that script is ready for pick-up, spoke with patient and he is aware of this 

## 2019-12-18 NOTE — Progress Notes (Signed)
Radiation Oncology         [phone]) [phone] ________________________________  Name: Kenneth Lee MRN: [id number]  Date: 12/19/2019  DOB: Jul 08, 1946  Post-Seed Follow-Up Visit Note  CC: Seward Carol, MD  Robley Fries, MD  Diagnosis:   74 y.o. gentleman with Stage T2a adenocarcinoma of the prostate with Gleason score of 4+3, and PSA of 4.57.    ICD-10-CM   1. Malignant neoplasm of prostate ([place])  C61     Interval Since Last Radiation:  3 weeks 11/26/19:  Insertion of radioactive I-125 seeds into the prostate gland; 145 Gy, definitive therapy with placement of SpaceOAR gel.  Narrative:  The patient returns today for routine follow-up.  He is complaining of increased urinary frequency and urinary hesitation symptoms. He filled out a questionnaire regarding urinary function today providing and overall IPSS score of 9 characterizing his symptoms as mild with mild.  His pre-implant score was 2. He denies any abdominal pain or bowel symptoms. He has not noted any significant change in his energy level and feels well in general.  Overall, he is quite pleased with his progress to date.  ALLERGIES:  is allergic to penicillins.  Meds: Current Outpatient Medications  Medication Sig Dispense Refill  . ALPRAZolam (XANAX) 1 MG tablet Take 1 tablet (1 mg total) by mouth as needed (one tablet 30 minutes prior to MRI and may repeat once, just prior to scan if needed.). 2 tablet 0  . amLODipine (NORVASC) 5 MG tablet TAKE 1 TABLET BY MOUTH EVERY DAY 90 tablet 3  . aspirin 325 MG tablet Take 325 mg by mouth daily.    Marland Kitchen BYSTOLIC 5 MG tablet TAKE 1 TABLET BY MOUTH EVERY DAY 90 tablet 2  . clindamycin (CLEOCIN) 150 MG capsule TAKE 4 CAPSULES BY MOUTH 1 HOUIR PRIOR TO APPOINTMENT    . fluticasone (FLONASE) 50 MCG/ACT nasal spray Place 2 sprays into both nostrils at bedtime.   5  . furosemide (LASIX) 40 MG tablet TAKE 1 TABLET BY MOUTH EVERY DAY 90 tablet 3  . gabapentin (NEURONTIN) 300 MG capsule Take  300 mg by mouth 3 (three) times daily.     . hydrALAZINE (APRESOLINE) 25 MG tablet TAKE 1 TABLET BY MOUTH THREE TIMES A DAY 270 tablet 1  . lisinopril (ZESTRIL) 20 MG tablet TAKE 1 TABLET BY MOUTH 2 TIMES DAILY. . 180 tablet 3  . meloxicam (MOBIC) 15 MG tablet Take 15 mg by mouth daily.  7  . metFORMIN (GLUCOPHAGE) 1000 MG tablet Take 1,000 mg by mouth 2 (two) times daily with a meal.     . Multiple Vitamin (MULTIVITAMIN WITH MINERALS) TABS tablet Take 1 tablet by mouth daily.    . Naphazoline HCl (CLEAR EYES OP) Place 1 drop into both eyes daily.    . nitroGLYCERIN (NITROSTAT) 0.4 MG SL tablet Place 0.4 mg under the tongue every 5 (five) minutes as needed for chest pain.    . Omega-3 Fatty Acids (FISH OIL) 1000 MG CAPS Take 1,000 mg by mouth daily.     . rosuvastatin (CRESTOR) 10 MG tablet TAKE 1 TABLET BY MOUTH EVERY DAY 90 tablet 3  . spironolactone (ALDACTONE) 25 MG tablet TAKE 1 TABLET BY MOUTH EVERY DAY 90 tablet 3  . tamsulosin (FLOMAX) 0.4 MG CAPS capsule Take 0.4 mg by mouth.    . traMADol (ULTRAM) 50 MG tablet Take 1 tablet (50 mg total) by mouth every 6 (six) hours as needed for severe pain (post seed implant). 12  tablet 0   No current facility-administered medications for this encounter.    Physical Findings: In general this is a well appearing Caucasian male in no acute distress. He's alert and oriented x4 and appropriate throughout the examination. Cardiopulmonary assessment is negative for acute distress and he exhibits normal effort.   Lab Findings: Lab Results  Component Value Date   WBC 8.9 11/22/2019   HGB 13.8 11/22/2019   HCT 42.4 11/22/2019   MCV 97.7 11/22/2019   PLT 196 11/22/2019    Radiographic Findings:  Patient underwent CT imaging in our clinic for post implant dosimetry. The CT will be reviewed by Dr. Tammi Klippel to confirm there is an adequate distribution of radioactive seeds throughout the prostate gland and ensure that there are no seeds in or near the  rectum. His scheduled for prostate MRI at noon today and those images will be fused with his CT images for further evaluation. We suspect the final radiation plan and dosimetry will show appropriate coverage of the prostate gland. He understands that we will call and inform him of any unexpected findings on further review of his imaging and dosimetry.  Impression/Plan:  74 y.o. gentleman with Stage T2a adenocarcinoma of the prostate with Gleason score of 4+3, and PSA of 4.57. The patient is recovering from the effects of radiation. His urinary symptoms should gradually improve over the next 4-6 months. We talked about this today. He is encouraged by his improvement already and is otherwise pleased with his outcome. We also talked about long-term follow-up for prostate cancer following seed implant. He understands that ongoing PSA determinations and digital rectal exams will help perform surveillance to rule out disease recurrence. He does not currently have a follow up appointment scheduled with Dr. Claudia Desanctis to his knowledge. He understands what to expect with his PSA measures. Patient was also educated today about some of the long-term effects from radiation including a small risk for rectal bleeding and possibly erectile dysfunction. We talked about some of the general management approaches to these potential complications. However, I did encourage the patient to contact our office or return at any point if he has questions or concerns related to his previous radiation and prostate cancer.  Today, a comprehensive survivorship care plan and treatment summary was reviewed with the patient today detailing his prostate cancer diagnosis, treatment course, potential late/long-term effects of treatment, appropriate follow-up care with recommendations for the future, and patient education resources.  A copy of this summary, along with a letter will be sent to the patient's primary care provider via fax after today's  visit.   2. Cancer screening:  Due to Mr. Pallas's history and his age, he should receive screening for skin cancers, colon cancer, and lung cancer.  The information and recommendations are listed on the patient's comprehensive care plan/treatment summary and were reviewed in detail with the patient.     3. Health maintenance and wellness promotion: Mr. Buckhalter was encouraged to consume 5-7 servings of fruits and vegetables per day. He was provided a copy of the "Nutrition Rainbow" handout, as well as the handout "Take Control of Your Health and [place]" from the [facility].  He was also encouraged to engage in moderate to vigorous exercise for 30 minutes per day most days of the week. Information was provided regarding the [facility] fitness program, which is designed for cancer survivors to help them become more physically fit after cancer treatments. We discussed that a healthy BMI is  18.5-24.9 and that maintaining a healthy weight reduces risk of cancer recurrences.  He was instructed to limit his alcohol consumption and was encouraged to stop smoking.  Lastly, he was encouraged to use sunscreen and wear protective clothing when in the sun.     4. Support services/counseling: It is not uncommon for this period of the patient's cancer care trajectory to be one of many emotions and stressors.  Mr. Fouts was encouraged to take advantage of our many support services programs, support groups, and/or counseling in coping with his new life as a cancer survivor after completing anti-cancer treatment.  He was offered support today through active listening and expressive supportive counseling.  He was given information regarding our available services and encouraged to contact me with any questions or for help enrolling in any of our support group/programs.       Nicholos Johns, PA-C

## 2019-12-19 ENCOUNTER — Ambulatory Visit
Admission: RE | Admit: 2019-12-19 | Discharge: 2019-12-19 | Disposition: A | Discharge status: Home / Self Care | Payer: PPO | Source: Ambulatory Visit | Attending: Urology | Admitting: Urology

## 2019-12-19 ENCOUNTER — Encounter: Payer: Self-pay | Admitting: Urology

## 2019-12-19 ENCOUNTER — Ambulatory Visit
Admission: RE | Admit: 2019-12-19 | Discharge: 2019-12-19 | Disposition: A | Discharge status: Home / Self Care | Payer: PPO | Source: Ambulatory Visit | Attending: Radiation Oncology | Admitting: Radiation Oncology

## 2019-12-19 ENCOUNTER — Encounter: Payer: Self-pay | Admitting: Medical Oncology

## 2019-12-19 ENCOUNTER — Ambulatory Visit (HOSPITAL_COMMUNITY)
Admission: RE | Admit: 2019-12-19 | Discharge: 2019-12-19 | Disposition: A | Discharge status: Home / Self Care | Payer: PPO | Source: Ambulatory Visit | Attending: Urology | Admitting: Urology

## 2019-12-19 ENCOUNTER — Other Ambulatory Visit: Payer: Self-pay

## 2019-12-19 VITALS — BP 123/46 | HR 43 | Temp 97.9°F | Resp 18 | Ht 71.0 in | Wt 262.4 lb

## 2019-12-19 DIAGNOSIS — C61 Malignant neoplasm of prostate: Secondary | ICD-10-CM | POA: Insufficient documentation

## 2019-12-19 NOTE — Progress Notes (Signed)
Weight stable. Diastolic bp low. Denies pain. Denies headache, dizziness, nausea, or chest pain. Pre seed IPSS 2. Post seed IPSS 9. Patient continues Flomax. Denies dysuria, hematuria, urinary leakage or incontinence. Denies diarrhea. Denies being seen or having a future appointment with urologist, Dr. Jeannett Senior, at this time. Scheduled for MRI to confirm SpaceOar placement at noon today.   BP (!) 123/46   Pulse (!) 43   Temp 97.9 F (36.6 C)   Resp 18   Ht 5\' 11"  (1.803 m)   Wt 262 lb 6.4 oz (119 kg)   SpO2 95%   BMI 36.60 kg/m  Wt Readings from Last 3 Encounters:  12/19/19 262 lb 6.4 oz (119 kg)  11/26/19 260 lb (117.9 kg)  11/22/19 265 lb 7 oz (120.4 kg)

## 2019-12-25 NOTE — Progress Notes (Signed)
  Radiation Oncology         [phone]) [phone] ________________________________  Name: Kenneth Lee MRN: [id number]  Date: 12/19/2019  DOB: May 17, 1946  COMPLEX SIMULATION NOTE  NARRATIVE:  The patient was brought to the [facility] today following prostate seed implantation approximately one month ago.  Identity was confirmed.  All relevant records and images related to the planned course of therapy were reviewed.  Then, the patient was set-up supine.  CT images were obtained.  The CT images were loaded into the planning software.  Then the prostate and rectum were contoured.  Treatment planning then occurred.  The implanted iodine 125 seeds were identified by the physics staff for projection of radiation distribution  I have requested : 3D Simulation  I have requested a DVH of the following structures: Prostate and rectum.    ________________________________  Sheral Apley Tammi Klippel, M.D.

## 2020-01-07 DIAGNOSIS — M25571 Pain in right ankle and joints of right foot: Secondary | ICD-10-CM | POA: Diagnosis not present

## 2020-01-09 ENCOUNTER — Encounter: Payer: Self-pay | Admitting: Radiation Oncology

## 2020-01-09 DIAGNOSIS — C61 Malignant neoplasm of prostate: Secondary | ICD-10-CM | POA: Diagnosis not present

## 2020-01-15 DIAGNOSIS — G4733 Obstructive sleep apnea (adult) (pediatric): Secondary | ICD-10-CM | POA: Diagnosis not present

## 2020-01-23 ENCOUNTER — Other Ambulatory Visit: Payer: Self-pay | Admitting: Interventional Cardiology

## 2020-01-26 ENCOUNTER — Other Ambulatory Visit: Payer: Self-pay | Admitting: Interventional Cardiology

## 2020-01-29 DIAGNOSIS — M25571 Pain in right ankle and joints of right foot: Secondary | ICD-10-CM | POA: Diagnosis not present

## 2020-02-03 NOTE — Progress Notes (Signed)
  Radiation Oncology         [phone]) [phone] ________________________________  Name: Kenneth Lee MRN: [id number]  Date: 01/09/2020  DOB: Nov 12, 1945  3D Planning Note   Prostate Brachytherapy Post-Implant Dosimetry  Diagnosis: 74 y.o. gentleman with Stage T2a adenocarcinoma of the prostate with Gleason score of 4+3, and PSA of 4.57  Narrative: On a previous date, Kenneth Lee returned following prostate seed implantation for post implant planning. He underwent CT scan complex simulation to delineate the three-dimensional structures of the pelvis and demonstrate the radiation distribution.  Since that time, the seed localization, and complex isodose planning with dose volume histograms have now been completed.  Results:   Prostate Coverage - The dose of radiation delivered to the 90% or more of the prostate gland (D90) was 98.47% of the prescription dose. This exceeds our goal of greater than 90%. Rectal Sparing - The volume of rectal tissue receiving the prescription dose or higher was 0.0 cc. This falls under our thresholds tolerance of 1.0 cc.  Impression: The prostate seed implant appears to show adequate target coverage and appropriate rectal sparing.  Plan:  The patient will continue to follow with urology for ongoing PSA determinations. I would anticipate a high likelihood for local tumor control with minimal risk for rectal morbidity.  ________________________________  Sheral Apley Tammi Klippel, M.D.

## 2020-03-04 DIAGNOSIS — M48062 Spinal stenosis, lumbar region with neurogenic claudication: Secondary | ICD-10-CM | POA: Diagnosis not present

## 2020-03-05 ENCOUNTER — Other Ambulatory Visit: Payer: Self-pay | Admitting: Neurological Surgery

## 2020-03-05 DIAGNOSIS — M48062 Spinal stenosis, lumbar region with neurogenic claudication: Secondary | ICD-10-CM

## 2020-03-11 ENCOUNTER — Ambulatory Visit (INDEPENDENT_AMBULATORY_CARE_PROVIDER_SITE_OTHER): Payer: PPO | Admitting: Otolaryngology

## 2020-03-11 ENCOUNTER — Other Ambulatory Visit: Payer: Self-pay

## 2020-03-11 VITALS — Temp 97.2°F

## 2020-03-11 DIAGNOSIS — J310 Chronic rhinitis: Secondary | ICD-10-CM | POA: Diagnosis not present

## 2020-03-11 DIAGNOSIS — H60311 Diffuse otitis externa, right ear: Secondary | ICD-10-CM | POA: Diagnosis not present

## 2020-03-11 NOTE — Progress Notes (Signed)
HPI: Kenneth Lee is a 74 y.o. male who presents for evaluation of coughing, sinus pressure and pain in the right ear.  He has used Z-Pak in the past for sinus problems that works well..  Past Medical History:  Diagnosis Date  . Arthritis   . Carotid disease, bilateral ([place])   . Chronic back pain    stenosis  . Coronary artery disease    with bare mental stent 2008 and RCA is Card   Dr.Smith  . DM type 2 (diabetes mellitus, type 2) ([place])    takes Metformin daily  . Gout    yrs ago none recent  . Heart murmur    as a teenager, no problems as an adult  . History of colon polyps    benign  . Hyperlipidemia    takes Crestor daily  . Hypertension    takes Amlodipine,Bystolic,Apresoline,and Lisinopril daily.   . Joint pain   . Other specified cardiac dysrhythmias(427.89)   . Peripheral edema    takes Lasix daily lle edema since 2019 bypass leg numb  . Peripheral neuropathy    feet both  . Prostate cancer ([place])   . Right ankle pain    x ray done 11-12-2019 no cause found right ankle very sensitive  . Sinoatrial node dysfunction ([facility])   . Sleep apnea    uses cpap  . Weakness    numbness and tingling,left leg   Past Surgical History:  Procedure Laterality Date  . ABDOMINAL AORTOGRAM N/A 12/29/2017   Procedure: ABDOMINAL AORTOGRAM;  Surgeon: Angelia Mould, MD;  Location: [facility];  Service: Cardiovascular;  Laterality: N/A;  . aortobifemoral bypass  1996  . BACK SURGERY  2016 and 2017   lumbar fusion l3 to l4 l 4 to l5  . CARDIAC CATHETERIZATION  2008  . CHOLECYSTECTOMY  2001  . COLONOSCOPY    . CORONARY ANGIOPLASTY     1 stent  . CYSTOSCOPY N/A 11/26/2019   Procedure: CYSTOSCOPY FLEXIBLE;  Surgeon: Robley Fries, MD;  Location: [facility];  Service: Urology;  Laterality: N/A;  No seeds seen per Dr. Claudia Desanctis  . discectomy  20 yrs ago  . ESOPHAGOGASTRODUODENOSCOPY    . EYE SURGERY Right 2018   cataract surgery with lens implant  .  FEMORAL-POPLITEAL BYPASS GRAFT Left 02/09/2018   Procedure: LEFT COMMON FEMORAL ENDARTERECTOMY WITH VEIN PATCH ANGIOPLASTY REVISION DISTAL LEFT LIMB OF AORTOFEMORAL BYPASS GRAFT;  Surgeon: Angelia Mould, MD;  Location: [facility];  Service: Vascular;  Laterality: Left;  . LOWER EXTREMITY ANGIOGRAPHY Bilateral 12/29/2017   Procedure: Lower Extremity Angiography;  Surgeon: Angelia Mould, MD;  Location: [facility];  Service: Cardiovascular;  Laterality: Bilateral;  . PROSTATE BIOPSY  2021  . RADIOACTIVE SEED IMPLANT N/A 11/26/2019   Procedure: RADIOACTIVE SEED IMPLANT/BRACHYTHERAPY IMPLANT;  Surgeon: Robley Fries, MD;  Location: [facility];  Service: Urology;  Laterality: N/A;  90 MINS  . SPACE OAR INSTILLATION N/A 11/26/2019   Procedure: SPACE OAR INSTILLATION;  Surgeon: Robley Fries, MD;  Location: [facility];  Service: Urology;  Laterality: N/A;   Social History   Socioeconomic History  . Marital status: Single    Spouse name: Not on file  . Number of children: 2  . Years of education: Not on file  . Highest education level: Not on file  Occupational History  . Not on file  Tobacco Use  . Smoking status: Current Every Day  Smoker    Packs/day: 1.50    Years: 30.00    Pack years: 45.00    Types: Cigarettes  . Smokeless tobacco: Never Used  . Tobacco comment: off and on some day smoker  Vaping Use  . Vaping Use: Never used  Substance and Sexual Activity  . Alcohol use: Yes    Comment: socially  . Drug use: No  . Sexual activity: Not Currently  Other Topics Concern  . Not on file  Social History Narrative  . Not on file   Social Determinants of Health   Financial Resource Strain:   . Difficulty of Paying Living Expenses: Not on file  Food Insecurity:   . Worried About Charity fundraiser in the Last Year: Not on file  . Ran Out of Food in the Last Year: Not on file  Transportation Needs:   . Lack of Transportation  (Medical): Not on file  . Lack of Transportation (Non-Medical): Not on file  Physical Activity:   . Days of Exercise per Week: Not on file  . Minutes of Exercise per Session: Not on file  Stress:   . Feeling of Stress : Not on file  Social Connections:   . Frequency of Communication with Friends and Family: Not on file  . Frequency of Social Gatherings with Friends and Family: Not on file  . Attends Religious Services: Not on file  . Active Member of Clubs or Organizations: Not on file  . Attends Archivist Meetings: Not on file  . Marital Status: Not on file   Family History  Problem Relation Age of Onset  . Heart disease Mother   . Cancer Mother        type unknown  . Heart attack Mother   . Hypertension Mother   . Hypertension Father   . Hypertension Other   . Hyperlipidemia Other   . Prostate cancer Brother   . Stroke Neg Hx   . Breast cancer Neg Hx   . Pancreatic cancer Neg Hx   . Colon cancer Neg Hx    Allergies  Allergen Reactions  . Penicillins Swelling    PATIENT HAS HAD A PCN REACTION WITH IMMEDIATE RASH, FACIAL/TONGUE/THROAT SWELLING, SOB, OR LIGHTHEADEDNESS WITH HYPOTENSION:  #  #  YES  #  #  Has patient had a PCN reaction causing severe rash involving mucus membranes or skin necrosis: No Has patient had a PCN reaction that required hospitalization No Has patient had a PCN reaction occurring within the last 10 years: No   Prior to Admission medications   Medication Sig Start Date End Date Taking? Authorizing Provider  ALPRAZolam Duanne Moron) 1 MG tablet Take 1 tablet (1 mg total) by mouth as needed (one tablet 30 minutes prior to MRI and may repeat once, just prior to scan if needed.). 12/17/19  Yes Bruning, Ashlyn, PA-C  amLODipine (NORVASC) 5 MG tablet TAKE 1 TABLET BY MOUTH EVERY DAY 11/27/19  Yes Belva Crome, MD  aspirin 325 MG tablet Take 325 mg by mouth daily.   Yes [provider]  BYSTOLIC 5 MG tablet TAKE 1 TABLET BY MOUTH EVERY DAY  05/29/19  Yes Belva Crome, MD  clindamycin (CLEOCIN) 150 MG capsule TAKE 4 CAPSULES BY MOUTH 1 HOUIR PRIOR TO APPOINTMENT 08/22/19  Yes [provider]  fluticasone (FLONASE) 50 MCG/ACT nasal spray Place 2 sprays into both nostrils at bedtime.  01/14/16  Yes [provider]  furosemide (LASIX) 40 MG tablet TAKE  1 TABLET BY MOUTH EVERY DAY 08/29/19  Yes Belva Crome, MD  gabapentin (NEURONTIN) 300 MG capsule Take 300 mg by mouth 3 (three) times daily.  06/20/19  Yes [provider]  hydrALAZINE (APRESOLINE) 25 MG tablet TAKE 1 TABLET BY MOUTH THREE TIMES A DAY 12/10/19  Yes Belva Crome, MD  JARDIANCE 10 MG TABS tablet Take 10 mg by mouth daily. 12/18/19  Yes [provider]  lisinopril (ZESTRIL) 20 MG tablet TAKE 1 TABLET BY MOUTH TWICE A DAY 01/28/20  Yes Belva Crome, MD  meloxicam (MOBIC) 15 MG tablet Take 15 mg by mouth daily. 08/04/15  Yes [provider]  metFORMIN (GLUCOPHAGE) 1000 MG tablet Take 1,000 mg by mouth 2 (two) times daily with a meal.  07/08/14  Yes [provider]  Multiple Vitamin (MULTIVITAMIN WITH MINERALS) TABS tablet Take 1 tablet by mouth daily.   Yes [provider]  Naphazoline HCl (CLEAR EYES OP) Place 1 drop into both eyes daily.   Yes [provider]  nitroGLYCERIN (NITROSTAT) 0.4 MG SL tablet Place 0.4 mg under the tongue every 5 (five) minutes as needed for chest pain.   Yes [provider]  Omega-3 Fatty Acids (FISH OIL) 1000 MG CAPS Take 1,000 mg by mouth daily.    Yes [provider]  rosuvastatin (CRESTOR) 10 MG tablet TAKE 1 TABLET BY MOUTH EVERY DAY 11/27/19  Yes Belva Crome, MD  spironolactone (ALDACTONE) 25 MG tablet TAKE 1 TABLET BY MOUTH EVERY DAY 01/23/20  Yes Belva Crome, MD  tamsulosin (FLOMAX) 0.4 MG CAPS capsule Take 0.4 mg by mouth.   Yes [provider]  traMADol (ULTRAM) 50 MG tablet Take 1 tablet (50 mg total) by mouth every 6 (six) hours as needed  for severe pain (post seed implant). 11/26/19 11/25/20 Yes Pace, Johny Chess, MD     Positive ROS: Otherwise negative  All other systems have been reviewed and were otherwise negative with the exception of those mentioned in the HPI and as above.  Physical Exam: Constitutional: Alert, well-appearing, no acute distress Ears: External ears without lesions or tenderness.  Left ear canal and TM are clear he had minimal wax buildup that was removed with forceps.  No signs of infection.  However on the right side he has a right external otitis with swelling and pain in the right ear canal.  After cleaning the ear canal I applied Ciprodex eardrops to the right ear canal. Nasal: External nose without lesions.  Mild rhinitis bilaterally.  No obvious mucopurulent discharge noted. Oral: Oropharynx clear. Neck: No palpable adenopathy or masses Respiratory: Breathing comfortably  Skin: No facial/neck lesions or rash noted.  Cerumen impaction removal  Date/Time: 03/11/2020 1:19 PM Performed by: Rozetta Nunnery, MD Authorized by: Rozetta Nunnery, MD   Consent:    Consent obtained:  Verbal   Consent given by:  Patient   Risks discussed:  Pain and bleeding Procedure details:    Location:  L ear and R ear   Procedure type: suction and forceps   Post-procedure details:    Inspection:  TM intact and canal normal   Hearing quality:  Improved   Patient tolerance of procedure:  Tolerated well, no immediate complications Comments:     Patient with a acute right external otitis with swelling and mild drainage.  After cleaning the ear canal I applied Ciprodex drops to the right ear only.    Assessment: Chronic rhinitis Questionable sinus disease Right  external otitis  Plan: Prescribed a Z-Pak and also prescribed Cortisporin otic suspension drops 4 to 5 drops twice daily in the right ear for 5 days.  He will notify us if the right ear is not feeling better within 2 days.  Radene Journey,  MD

## 2020-03-17 ENCOUNTER — Ambulatory Visit (INDEPENDENT_AMBULATORY_CARE_PROVIDER_SITE_OTHER): Payer: PPO | Admitting: Otolaryngology

## 2020-03-17 ENCOUNTER — Other Ambulatory Visit: Payer: Self-pay

## 2020-03-17 VITALS — Temp 96.1°F

## 2020-03-17 DIAGNOSIS — H6505 Acute serous otitis media, recurrent, left ear: Secondary | ICD-10-CM

## 2020-03-17 NOTE — Progress Notes (Signed)
HPI: Kenneth Lee is a 74 y.o. male who returns today for evaluation of left ear complaints..  This is the ear that generally gives him problems.  He has fullness and pressure in the left ear.  With slight decreased hearing.  I put a tube in the left ear several months ago but he did not tolerate this well and this was removed.  Past Medical History:  Diagnosis Date  . Arthritis   . Carotid disease, bilateral ([place])   . Chronic back pain    stenosis  . Coronary artery disease    with bare mental stent 2008 and RCA is Card   Dr.Smith  . DM type 2 (diabetes mellitus, type 2) ([place])    takes Metformin daily  . Gout    yrs ago none recent  . Heart murmur    as a teenager, no problems as an adult  . History of colon polyps    benign  . Hyperlipidemia    takes Crestor daily  . Hypertension    takes Amlodipine,Bystolic,Apresoline,and Lisinopril daily.   . Joint pain   . Other specified cardiac dysrhythmias(427.89)   . Peripheral edema    takes Lasix daily lle edema since 2019 bypass leg numb  . Peripheral neuropathy    feet both  . Prostate cancer ([place])   . Right ankle pain    x ray done 11-12-2019 no cause found right ankle very sensitive  . Sinoatrial node dysfunction ([facility])   . Sleep apnea    uses cpap  . Weakness    numbness and tingling,left leg   Past Surgical History:  Procedure Laterality Date  . ABDOMINAL AORTOGRAM N/A 12/29/2017   Procedure: ABDOMINAL AORTOGRAM;  Surgeon: Angelia Mould, MD;  Location: [facility];  Service: Cardiovascular;  Laterality: N/A;  . aortobifemoral bypass  1996  . BACK SURGERY  2016 and 2017   lumbar fusion l3 to l4 l 4 to l5  . CARDIAC CATHETERIZATION  2008  . CHOLECYSTECTOMY  2001  . COLONOSCOPY    . CORONARY ANGIOPLASTY     1 stent  . CYSTOSCOPY N/A 11/26/2019   Procedure: CYSTOSCOPY FLEXIBLE;  Surgeon: Robley Fries, MD;  [facility];  Service: Urology;  Laterality: N/A;  No seeds seen per  Dr. Claudia Desanctis  . discectomy  20 yrs ago  . ESOPHAGOGASTRODUODENOSCOPY    . EYE SURGERY Right 2018   cataract surgery with lens implant  . FEMORAL-POPLITEAL BYPASS GRAFT Left 02/09/2018   Procedure: LEFT COMMON FEMORAL ENDARTERECTOMY WITH VEIN PATCH ANGIOPLASTY REVISION DISTAL LEFT LIMB OF AORTOFEMORAL BYPASS GRAFT;  Surgeon: Angelia Mould, MD;  Location: [facility] [facility];  Service: Vascular;  Laterality: Left;  . LOWER EXTREMITY ANGIOGRAPHY Bilateral 12/29/2017   Procedure: Lower Extremity Angiography;  Surgeon: Angelia Mould, MD;  Location: [facility];  Service: Cardiovascular;  Laterality: Bilateral;  . PROSTATE BIOPSY  2021  . RADIOACTIVE SEED IMPLANT N/A 11/26/2019   Procedure: RADIOACTIVE SEED IMPLANT/BRACHYTHERAPY IMPLANT;  Surgeon: Robley Fries, MD;  Location: [place];  Service: Urology;  Laterality: N/A;  90 MINS  . SPACE OAR INSTILLATION N/A 11/26/2019   Procedure: SPACE OAR INSTILLATION;  Surgeon: Robley Fries, MD;  Location: [facility];  Service: Urology;  Laterality: N/A;   Social History   Socioeconomic History  . Marital status: Single    Spouse name: Not on file  . Number of children: 2  . Years of education:  Not on file  . Highest education level: Not on file  Occupational History  . Not on file  Tobacco Use  . Smoking status: Current Every Day Smoker    Packs/day: 1.50    Years: 30.00    Pack years: 45.00    Types: Cigarettes  . Smokeless tobacco: Never Used  . Tobacco comment: off and on some day smoker  Vaping Use  . Vaping Use: Never used  Substance and Sexual Activity  . Alcohol use: Yes    Comment: socially  . Drug use: No  . Sexual activity: Not Currently  Other Topics Concern  . Not on file  Social History Narrative  . Not on file   Social Determinants of Health   Financial Resource Strain:   . Difficulty of Paying Living Expenses: Not on file  Food Insecurity:   . Worried About Sales executive in the Last Year: Not on file  . Ran Out of Food in the Last Year: Not on file  Transportation Needs:   . Lack of Transportation (Medical): Not on file  . Lack of Transportation (Non-Medical): Not on file  Physical Activity:   . Days of Exercise per Week: Not on file  . Minutes of Exercise per Session: Not on file  Stress:   . Feeling of Stress : Not on file  Social Connections:   . Frequency of Communication with Friends and Family: Not on file  . Frequency of Social Gatherings with Friends and Family: Not on file  . Attends Religious Services: Not on file  . Active Member of Clubs or Organizations: Not on file  . Attends Archivist Meetings: Not on file  . Marital Status: Not on file   Family History  Problem Relation Age of Onset  . Heart disease Mother   . Cancer Mother        type unknown  . Heart attack Mother   . Hypertension Mother   . Hypertension Father   . Hypertension Other   . Hyperlipidemia Other   . Prostate cancer Brother   . Stroke Neg Hx   . Breast cancer Neg Hx   . Pancreatic cancer Neg Hx   . Colon cancer Neg Hx    Allergies  Allergen Reactions  . Penicillins Swelling    PATIENT HAS HAD A PCN REACTION WITH IMMEDIATE RASH, FACIAL/TONGUE/THROAT SWELLING, SOB, OR LIGHTHEADEDNESS WITH HYPOTENSION:  #  #  YES  #  #  Has patient had a PCN reaction causing severe rash involving mucus membranes or skin necrosis: No Has patient had a PCN reaction that required hospitalization No Has patient had a PCN reaction occurring within the last 10 years: No   Prior to Admission medications   Medication Sig Start Date End Date Taking? Authorizing Provider  ALPRAZolam Duanne Moron) 1 MG tablet Take 1 tablet (1 mg total) by mouth as needed (one tablet 30 minutes prior to MRI and may repeat once, just prior to scan if needed.). 12/17/19   Bruning, Ashlyn, PA-C  amLODipine (NORVASC) 5 MG tablet TAKE 1 TABLET BY MOUTH EVERY DAY 11/27/19   Belva Crome, MD  aspirin 325  MG tablet Take 325 mg by mouth daily.    [provider]  BYSTOLIC 5 MG tablet TAKE 1 TABLET BY MOUTH EVERY DAY 05/29/19   Belva Crome, MD  clindamycin (CLEOCIN) 150 MG capsule TAKE 4 CAPSULES BY MOUTH 1 HOUIR PRIOR TO APPOINTMENT 08/22/19   [provider]  fluticasone (FLONASE) 50 MCG/ACT nasal spray Place 2 sprays into both nostrils at bedtime.  01/14/16   [provider]  furosemide (LASIX) 40 MG tablet TAKE 1 TABLET BY MOUTH EVERY DAY 08/29/19   Belva Crome, MD  gabapentin (NEURONTIN) 300 MG capsule Take 300 mg by mouth 3 (three) times daily.  06/20/19   [provider]  hydrALAZINE (APRESOLINE) 25 MG tablet TAKE 1 TABLET BY MOUTH THREE TIMES A DAY 12/10/19   Belva Crome, MD  JARDIANCE 10 MG TABS tablet Take 10 mg by mouth daily. 12/18/19   [provider]  lisinopril (ZESTRIL) 20 MG tablet TAKE 1 TABLET BY MOUTH TWICE A DAY 01/28/20   Belva Crome, MD  meloxicam (MOBIC) 15 MG tablet Take 15 mg by mouth daily. 08/04/15   [provider]  metFORMIN (GLUCOPHAGE) 1000 MG tablet Take 1,000 mg by mouth 2 (two) times daily with a meal.  07/08/14   [provider]  Multiple Vitamin (MULTIVITAMIN WITH MINERALS) TABS tablet Take 1 tablet by mouth daily.    [provider]  Naphazoline HCl (CLEAR EYES OP) Place 1 drop into both eyes daily.    [provider]  nitroGLYCERIN (NITROSTAT) 0.4 MG SL tablet Place 0.4 mg under the tongue every 5 (five) minutes as needed for chest pain.    [provider]  Omega-3 Fatty Acids (FISH OIL) 1000 MG CAPS Take 1,000 mg by mouth daily.     [provider]  rosuvastatin (CRESTOR) 10 MG tablet TAKE 1 TABLET BY MOUTH EVERY DAY 11/27/19   Belva Crome, MD  spironolactone (ALDACTONE) 25 MG tablet TAKE 1 TABLET BY MOUTH EVERY DAY 01/23/20   Belva Crome, MD  tamsulosin (FLOMAX) 0.4 MG CAPS capsule Take 0.4 mg by mouth.    [provider]  traMADol (ULTRAM) 50 MG  tablet Take 1 tablet (50 mg total) by mouth every 6 (six) hours as needed for severe pain (post seed implant). 11/26/19 11/25/20  Robley Fries, MD     Positive ROS: Otherwise negative  All other systems have been reviewed and were otherwise negative with the exception of those mentioned in the HPI and as above.  Physical Exam: Constitutional: Alert, well-appearing, no acute distress Ears: External ears without lesions or tenderness.  Right ear canal right TM are clear.  Left ear canal with minimal cerumen buildup.  The left TM is retracted with mild conductive loss on tuning fork testing.  I was able to insufflate some air behind the left TM with improved hearing. Nasal: External nose without lesions.  Mild rhinitis but no signs of infection.. Clear nasal passages Oral: Lips and gums without lesions. Tongue and palate mucosa without lesions. Posterior oropharynx clear. Neck: No palpable adenopathy or masses Respiratory: Breathing comfortably  Skin: No facial/neck lesions or rash noted.  Procedures  Assessment: Left ear serous otitis media  Plan: Prescribed a Z-Pak which has helped him in the past.  He just recently completed a Z-Pak a little over a week ago. Also recommended that he use his "ear popper" that he has at home and has used in the past. Recommended occasional use of decongestant in the mornings and uses nasal steroid spray at night.   Radene Journey, MD

## 2020-03-19 DIAGNOSIS — M25571 Pain in right ankle and joints of right foot: Secondary | ICD-10-CM | POA: Diagnosis not present

## 2020-03-20 ENCOUNTER — Ambulatory Visit (INDEPENDENT_AMBULATORY_CARE_PROVIDER_SITE_OTHER): Payer: PPO | Admitting: Otolaryngology

## 2020-03-22 ENCOUNTER — Other Ambulatory Visit: Payer: PPO

## 2020-03-26 DIAGNOSIS — R3912 Poor urinary stream: Secondary | ICD-10-CM | POA: Diagnosis not present

## 2020-03-26 DIAGNOSIS — N5201 Erectile dysfunction due to arterial insufficiency: Secondary | ICD-10-CM | POA: Diagnosis not present

## 2020-03-26 DIAGNOSIS — C61 Malignant neoplasm of prostate: Secondary | ICD-10-CM | POA: Diagnosis not present

## 2020-04-01 DIAGNOSIS — H524 Presbyopia: Secondary | ICD-10-CM | POA: Diagnosis not present

## 2020-04-01 DIAGNOSIS — H2512 Age-related nuclear cataract, left eye: Secondary | ICD-10-CM | POA: Diagnosis not present

## 2020-04-01 DIAGNOSIS — E119 Type 2 diabetes mellitus without complications: Secondary | ICD-10-CM | POA: Diagnosis not present

## 2020-04-01 DIAGNOSIS — H43813 Vitreous degeneration, bilateral: Secondary | ICD-10-CM | POA: Diagnosis not present

## 2020-04-02 ENCOUNTER — Other Ambulatory Visit: Payer: Self-pay | Admitting: Interventional Cardiology

## 2020-04-12 ENCOUNTER — Other Ambulatory Visit: Payer: Self-pay

## 2020-04-12 ENCOUNTER — Ambulatory Visit
Admission: RE | Admit: 2020-04-12 | Discharge: 2020-04-12 | Disposition: A | Discharge status: Home / Self Care | Payer: PPO | Source: Ambulatory Visit | Attending: Neurological Surgery | Admitting: Neurological Surgery

## 2020-04-12 DIAGNOSIS — M545 Low back pain, unspecified: Secondary | ICD-10-CM | POA: Diagnosis not present

## 2020-04-12 DIAGNOSIS — M48062 Spinal stenosis, lumbar region with neurogenic claudication: Secondary | ICD-10-CM

## 2020-04-12 DIAGNOSIS — M4056 Lordosis, unspecified, lumbar region: Secondary | ICD-10-CM | POA: Diagnosis not present

## 2020-04-12 MED ORDER — GADOBENATE DIMEGLUMINE 529 MG/ML IV SOLN
20.0000 mL | Freq: Once | INTRAVENOUS | Status: AC | PRN
  Administered 2020-04-12: 20 mL via INTRAVENOUS

## 2020-04-15 ENCOUNTER — Other Ambulatory Visit: Payer: Self-pay | Admitting: Neurological Surgery

## 2020-04-15 DIAGNOSIS — I10 Essential (primary) hypertension: Secondary | ICD-10-CM | POA: Diagnosis not present

## 2020-04-15 DIAGNOSIS — M48062 Spinal stenosis, lumbar region with neurogenic claudication: Secondary | ICD-10-CM | POA: Diagnosis not present

## 2020-04-15 DIAGNOSIS — Z6834 Body mass index (BMI) 34.0-34.9, adult: Secondary | ICD-10-CM | POA: Diagnosis not present

## 2020-04-16 ENCOUNTER — Other Ambulatory Visit (INDEPENDENT_AMBULATORY_CARE_PROVIDER_SITE_OTHER): Payer: Self-pay | Admitting: Otolaryngology

## 2020-04-21 ENCOUNTER — Other Ambulatory Visit (INDEPENDENT_AMBULATORY_CARE_PROVIDER_SITE_OTHER): Payer: Self-pay

## 2020-04-21 MED ORDER — FLUTICASONE PROPIONATE 50 MCG/ACT NA SUSP: 2.0000 | Freq: Every day | NASAL | 6 refills | Status: AC

## 2020-04-24 ENCOUNTER — Other Ambulatory Visit: Payer: Self-pay | Admitting: Neurological Surgery

## 2020-04-25 ENCOUNTER — Other Ambulatory Visit (HOSPITAL_COMMUNITY)
Admission: RE | Admit: 2020-04-25 | Discharge: 2020-04-25 | Disposition: A | Discharge status: Home / Self Care | Payer: PPO | Source: Ambulatory Visit | Attending: Neurological Surgery | Admitting: Neurological Surgery

## 2020-04-25 DIAGNOSIS — Z20822 Contact with and (suspected) exposure to covid-19: Secondary | ICD-10-CM | POA: Diagnosis present

## 2020-04-25 DIAGNOSIS — M4726 Other spondylosis with radiculopathy, lumbar region: Secondary | ICD-10-CM | POA: Diagnosis not present

## 2020-04-25 DIAGNOSIS — F17210 Nicotine dependence, cigarettes, uncomplicated: Secondary | ICD-10-CM | POA: Diagnosis present

## 2020-04-25 DIAGNOSIS — Z8546 Personal history of malignant neoplasm of prostate: Secondary | ICD-10-CM | POA: Diagnosis not present

## 2020-04-25 DIAGNOSIS — Z8249 Family history of ischemic heart disease and other diseases of the circulatory system: Secondary | ICD-10-CM | POA: Diagnosis not present

## 2020-04-25 DIAGNOSIS — Z01812 Encounter for preprocedural laboratory examination: Secondary | ICD-10-CM | POA: Insufficient documentation

## 2020-04-25 DIAGNOSIS — M40209 Unspecified kyphosis, site unspecified: Secondary | ICD-10-CM | POA: Diagnosis not present

## 2020-04-25 DIAGNOSIS — Z9049 Acquired absence of other specified parts of digestive tract: Secondary | ICD-10-CM | POA: Diagnosis not present

## 2020-04-25 DIAGNOSIS — G8929 Other chronic pain: Secondary | ICD-10-CM | POA: Diagnosis present

## 2020-04-25 DIAGNOSIS — Z79899 Other long term (current) drug therapy: Secondary | ICD-10-CM | POA: Diagnosis not present

## 2020-04-25 DIAGNOSIS — G4730 Sleep apnea, unspecified: Secondary | ICD-10-CM | POA: Diagnosis present

## 2020-04-25 DIAGNOSIS — E785 Hyperlipidemia, unspecified: Secondary | ICD-10-CM | POA: Diagnosis present

## 2020-04-25 DIAGNOSIS — M4326 Fusion of spine, lumbar region: Secondary | ICD-10-CM | POA: Diagnosis not present

## 2020-04-25 DIAGNOSIS — Z8042 Family history of malignant neoplasm of prostate: Secondary | ICD-10-CM | POA: Diagnosis not present

## 2020-04-25 DIAGNOSIS — E119 Type 2 diabetes mellitus without complications: Secondary | ICD-10-CM | POA: Diagnosis present

## 2020-04-25 DIAGNOSIS — Z981 Arthrodesis status: Secondary | ICD-10-CM | POA: Diagnosis not present

## 2020-04-25 DIAGNOSIS — Z880 Allergy status to penicillin: Secondary | ICD-10-CM | POA: Diagnosis not present

## 2020-04-25 DIAGNOSIS — I2510 Atherosclerotic heart disease of native coronary artery without angina pectoris: Secondary | ICD-10-CM | POA: Diagnosis present

## 2020-04-25 DIAGNOSIS — Z7984 Long term (current) use of oral hypoglycemic drugs: Secondary | ICD-10-CM | POA: Diagnosis not present

## 2020-04-25 DIAGNOSIS — M48061 Spinal stenosis, lumbar region without neurogenic claudication: Secondary | ICD-10-CM | POA: Diagnosis not present

## 2020-04-25 DIAGNOSIS — I10 Essential (primary) hypertension: Secondary | ICD-10-CM | POA: Diagnosis present

## 2020-04-25 DIAGNOSIS — M48062 Spinal stenosis, lumbar region with neurogenic claudication: Secondary | ICD-10-CM | POA: Diagnosis present

## 2020-04-25 DIAGNOSIS — M5416 Radiculopathy, lumbar region: Secondary | ICD-10-CM | POA: Diagnosis present

## 2020-04-25 DIAGNOSIS — Z86010 Personal history of colonic polyps: Secondary | ICD-10-CM | POA: Diagnosis not present

## 2020-04-25 DIAGNOSIS — Z7982 Long term (current) use of aspirin: Secondary | ICD-10-CM | POA: Diagnosis not present

## 2020-04-25 DIAGNOSIS — M479 Spondylosis, unspecified: Secondary | ICD-10-CM | POA: Diagnosis present

## 2020-04-25 DIAGNOSIS — M1990 Unspecified osteoarthritis, unspecified site: Secondary | ICD-10-CM | POA: Diagnosis present

## 2020-04-25 DIAGNOSIS — G834 Cauda equina syndrome: Secondary | ICD-10-CM | POA: Diagnosis present

## 2020-04-25 LAB — SARS CORONAVIRUS 2 (TAT 6-24 HRS): SARS Coronavirus 2: NEGATIVE

## 2020-04-27 NOTE — Anesthesia Preprocedure Evaluation (Addendum)
Anesthesia Evaluation  Patient identified by MRN, date of birth, ID band Patient awake    Reviewed: Patient's Chart, lab work & pertinent test results  Airway Mallampati: II  TM Distance: >3 FB Neck ROM: Full    Dental  (+) Teeth Intact   Pulmonary sleep apnea and Continuous Positive Airway Pressure Ventilation , Current Smoker and Patient abstained from smoking.,    Pulmonary exam normal        Cardiovascular hypertension, Pt. on medications + CAD, + Cardiac Stents and + Peripheral Vascular Disease   Rhythm:Regular Rate:Normal     Neuro/Psych negative neurological ROS  negative psych ROS   GI/Hepatic negative GI ROS, Neg liver ROS,   Endo/Other  diabetes, Well Controlled, Oral Hypoglycemic Agents  Renal/GU negative Renal ROS   Prostate Ca    Musculoskeletal  (+) Arthritis , Osteoarthritis,    Abdominal (+)  Abdomen: soft. Bowel sounds: normal.  Peds  Hematology negative hematology ROS (+)   Anesthesia Other Findings   Reproductive/Obstetrics                           Anesthesia Physical Anesthesia Plan  ASA: III  Anesthesia Plan: General   Post-op Pain Management:    Induction: Intravenous  PONV Risk Score and Plan: 1 and Ondansetron, Dexamethasone and Treatment may vary due to age or medical condition  Airway Management Planned: Mask and Oral ETT  Additional Equipment: None  Intra-op Plan:   Post-operative Plan: Extubation in OR  Informed Consent: I have reviewed the patients History and Physical, chart, labs and discussed the procedure including the risks, benefits and alternatives for the proposed anesthesia with the patient or authorized representative who has indicated his/her understanding and acceptance.     Dental advisory given  Plan Discussed with: CRNA and Surgeon  Anesthesia Plan Comments: (Lab Results      Component                Value                Date                      WBC                      7.7                 04/28/2020                HGB                      13.0                04/28/2020                HCT                      42.0                04/28/2020                MCV                      99.3                04/28/2020                PLT  158                 04/28/2020           Lab Results      Component                Value               Date                      NA                       135                 11/22/2019                K                        4.8                 11/22/2019                CO2                      23                  11/22/2019                GLUCOSE                  169 (H)             11/22/2019                BUN                      37 (H)              11/22/2019                CREATININE               1.33 (H)            11/22/2019                CALCIUM                  8.9                 11/22/2019                GFRNONAA                 52 (L)              11/22/2019                GFRAA                    >60                 11/22/2019           PAT note by Karoline Caldwell, PA-C: Follows with cardiology for history of CAD with bare mental stent RCA 2008, bilateral carotid disease, sinus node dysfunction, hypertension, hyperlipidemia, and obstructive sleep apnea. He also has PAD s/p revisionof left common femoral endarterectomy 02/2018.  Nuclear stress July 2019 was low risk.  Last seen by Dr. Tamala Julian 06/14/2019.  No cardiac complaints at that time.  Advised to continue secondary prevention, continue CPAP.  Recommended follow-up in 12 months.  Patient was subsequently cleared by cardiology to undergo urologic surgery in March 2021, which the patient did undergo uneventfully.  We will need day of surgery labs and evaluation.  EKG 10/31/2019: Marked sinus bradycardia with 1st degree A-V block.  Rate 42.  Carotid duplex 06/24/2019: Summary:  Right Carotid:  Velocities in the right ICA are consistent with a 40-59% stenosis. Non-hemodynamically significant plaque <50% noted in the CCA. There has been a increase in the velocities and stenosis since prior exam.   Left Carotid: Velocities in the left ICA are consistent with a 1-39% stenosis. Non-hemodynamically significant plaque <50% noted in the CCA. The ECA appears >50% stenosed. Although the velocities are within 1-39% range the shadowing in the proximal ICA can obscure a higher velocity that cannot be seen.   Vertebrals: Right vertebral artery demonstrates antegrade flow. Atypical antegrade flow in the left vertebral artery might indicate a early subclavian steal syndrome. There is a 79mmHg difference in the brachial pressures.   Subclavians: Normal flow hemodynamics were seen in bilateral subclavian arteries.   Nuclear stress 01/24/2018: Nuclear stress EF: 55%. No wall motion abnormalities There was no ST segment deviation noted during stress. Defect 1: There is a small defect of mild severity present in the apex location. This is a low risk study. No evidence of ischemia identified. Prior bare-metal stent to RCA.  TTE 05/10/2015: - Left ventricle: The cavity size was normal. Systolic function was  normal. The estimated ejection fraction was in the range of 55%  to 60%. Wall motion was normal; there were no regional wall  motion abnormalities. Features are consistent with a pseudonormal  left ventricular filling pattern, with concomitant abnormal  relaxation and increased filling pressure (grade 2 diastolic  dysfunction). Doppler parameters are consistent with high  ventricular filling pressure.  - Aortic valve: Moderate focal calcification involving the  noncoronary cusp.  - Mitral valve: There was trivial regurgitation.  - Atrial septum: There was increased thickness of the septum,  consistent with lipomatous hypertrophy. Echo contrast study  showed no right-to-left  atrial level shunt, at baseline or with  provocation.   )      Anesthesia Quick Evaluation

## 2020-04-27 NOTE — Progress Notes (Signed)
Anesthesia Chart Review: Same day workup  Follows with cardiology for history of CAD with bare mental stent RCA 2008, bilateral carotid disease, sinus node dysfunction, hypertension, hyperlipidemia, and obstructive sleep apnea. He also has PAD s/p revisionof left common femoral endarterectomy 02/2018.  Nuclear stress July 2019 was low risk.  Last seen by Dr. Tamala Julian 06/14/2019.  No cardiac complaints at that time.  Advised to continue secondary prevention, continue CPAP.  Recommended follow-up in 12 months.  Patient was subsequently cleared by cardiology to undergo urologic surgery in March 2021, which the patient did undergo uneventfully.  We will need day of surgery labs and evaluation.  EKG 10/31/2019: Marked sinus bradycardia with 1st degree A-V block.  Rate 42.  Carotid duplex 06/24/2019: Summary:  Right Carotid: Velocities in the right ICA are consistent with a 40-59% stenosis. Non-hemodynamically significant plaque <50% noted in the CCA. There has been a increase in the velocities and stenosis since prior exam.   Left Carotid: Velocities in the left ICA are consistent with a 1-39% stenosis. Non-hemodynamically significant plaque <50% noted in the CCA. The ECA appears >50% stenosed. Although the velocities are within 1-39% range the shadowing in the proximal ICA can obscure a higher velocity that cannot be seen.   Vertebrals: Right vertebral artery demonstrates antegrade flow. Atypical antegrade flow in the left vertebral artery might indicate a early subclavian steal syndrome. There is a 54mmHg difference in the brachial pressures.   Subclavians: Normal flow hemodynamics were seen in bilateral subclavian arteries.   Nuclear stress 01/24/2018:  Nuclear stress EF: 55%. No wall motion abnormalities  There was no ST segment deviation noted during stress.  Defect 1: There is a small defect of mild severity present in the apex location.  This is a low risk study. No evidence of ischemia  identified. Prior bare-metal stent to RCA.  TTE 05/10/2015: - Left ventricle: The cavity size was normal. Systolic function was  normal. The estimated ejection fraction was in the range of 55%  to 60%. Wall motion was normal; there were no regional wall  motion abnormalities. Features are consistent with a pseudonormal  left ventricular filling pattern, with concomitant abnormal  relaxation and increased filling pressure (grade 2 diastolic  dysfunction). Doppler parameters are consistent with high  ventricular filling pressure.  - Aortic valve: Moderate focal calcification involving the  noncoronary cusp.  - Mitral valve: There was trivial regurgitation.  - Atrial septum: There was increased thickness of the septum,  consistent with lipomatous hypertrophy. Echo contrast study  showed no right-to-left atrial level shunt, at baseline or with  provocation.    Wynonia Musty [facility]/Anesthesiology Phone [phone] 04/27/2020 3:40 PM

## 2020-04-28 ENCOUNTER — Inpatient Hospital Stay (HOSPITAL_COMMUNITY)
Admission: RE | Admit: 2020-04-28 | Discharge: 2020-04-29 | DRG: 454 | Disposition: A | Discharge status: Home / Self Care | Payer: PPO | Attending: Neurological Surgery | Admitting: Neurological Surgery

## 2020-04-28 ENCOUNTER — Encounter (HOSPITAL_COMMUNITY): Payer: Self-pay | Admitting: Neurological Surgery

## 2020-04-28 ENCOUNTER — Inpatient Hospital Stay (HOSPITAL_COMMUNITY): Payer: PPO

## 2020-04-28 ENCOUNTER — Other Ambulatory Visit: Payer: Self-pay

## 2020-04-28 ENCOUNTER — Inpatient Hospital Stay (HOSPITAL_COMMUNITY): Payer: PPO | Admitting: Physician Assistant

## 2020-04-28 ENCOUNTER — Encounter (HOSPITAL_COMMUNITY)
Admission: RE | Disposition: A | Discharge status: Home / Self Care | Payer: Self-pay | Source: Home / Self Care | Attending: Neurological Surgery

## 2020-04-28 DIAGNOSIS — G834 Cauda equina syndrome: Secondary | ICD-10-CM | POA: Diagnosis present

## 2020-04-28 DIAGNOSIS — G4730 Sleep apnea, unspecified: Secondary | ICD-10-CM | POA: Diagnosis present

## 2020-04-28 DIAGNOSIS — M5416 Radiculopathy, lumbar region: Secondary | ICD-10-CM | POA: Diagnosis present

## 2020-04-28 DIAGNOSIS — M48062 Spinal stenosis, lumbar region with neurogenic claudication: Secondary | ICD-10-CM | POA: Diagnosis present

## 2020-04-28 DIAGNOSIS — Z86010 Personal history of colonic polyps: Secondary | ICD-10-CM | POA: Diagnosis not present

## 2020-04-28 DIAGNOSIS — E785 Hyperlipidemia, unspecified: Secondary | ICD-10-CM | POA: Diagnosis present

## 2020-04-28 DIAGNOSIS — Z8042 Family history of malignant neoplasm of prostate: Secondary | ICD-10-CM | POA: Diagnosis not present

## 2020-04-28 DIAGNOSIS — Z880 Allergy status to penicillin: Secondary | ICD-10-CM

## 2020-04-28 DIAGNOSIS — Z20822 Contact with and (suspected) exposure to covid-19: Secondary | ICD-10-CM | POA: Diagnosis present

## 2020-04-28 DIAGNOSIS — Z8249 Family history of ischemic heart disease and other diseases of the circulatory system: Secondary | ICD-10-CM | POA: Diagnosis not present

## 2020-04-28 DIAGNOSIS — I2510 Atherosclerotic heart disease of native coronary artery without angina pectoris: Secondary | ICD-10-CM | POA: Diagnosis present

## 2020-04-28 DIAGNOSIS — Z7982 Long term (current) use of aspirin: Secondary | ICD-10-CM

## 2020-04-28 DIAGNOSIS — M1990 Unspecified osteoarthritis, unspecified site: Secondary | ICD-10-CM | POA: Diagnosis present

## 2020-04-28 DIAGNOSIS — Z8546 Personal history of malignant neoplasm of prostate: Secondary | ICD-10-CM | POA: Diagnosis not present

## 2020-04-28 DIAGNOSIS — Z419 Encounter for procedure for purposes other than remedying health state, unspecified: Secondary | ICD-10-CM

## 2020-04-28 DIAGNOSIS — Z981 Arthrodesis status: Secondary | ICD-10-CM | POA: Diagnosis not present

## 2020-04-28 DIAGNOSIS — Z9049 Acquired absence of other specified parts of digestive tract: Secondary | ICD-10-CM

## 2020-04-28 DIAGNOSIS — Z79899 Other long term (current) drug therapy: Secondary | ICD-10-CM

## 2020-04-28 DIAGNOSIS — I10 Essential (primary) hypertension: Secondary | ICD-10-CM | POA: Diagnosis present

## 2020-04-28 DIAGNOSIS — E119 Type 2 diabetes mellitus without complications: Secondary | ICD-10-CM | POA: Diagnosis present

## 2020-04-28 DIAGNOSIS — M4326 Fusion of spine, lumbar region: Secondary | ICD-10-CM | POA: Diagnosis not present

## 2020-04-28 DIAGNOSIS — F17210 Nicotine dependence, cigarettes, uncomplicated: Secondary | ICD-10-CM | POA: Diagnosis present

## 2020-04-28 DIAGNOSIS — G8929 Other chronic pain: Secondary | ICD-10-CM | POA: Diagnosis present

## 2020-04-28 DIAGNOSIS — M479 Spondylosis, unspecified: Secondary | ICD-10-CM | POA: Diagnosis present

## 2020-04-28 DIAGNOSIS — Z7984 Long term (current) use of oral hypoglycemic drugs: Secondary | ICD-10-CM

## 2020-04-28 DIAGNOSIS — M48061 Spinal stenosis, lumbar region without neurogenic claudication: Secondary | ICD-10-CM | POA: Diagnosis present

## 2020-04-28 HISTORY — PX: ANTERIOR LAT LUMBAR FUSION: SHX1168

## 2020-04-28 LAB — CBC
HCT: 42 % (ref 39.0–52.0)
Hemoglobin: 13 g/dL (ref 13.0–17.0)
MCH: 30.7 pg (ref 26.0–34.0)
MCHC: 31 g/dL (ref 30.0–36.0)
MCV: 99.3 fL (ref 80.0–100.0)
Platelets: 158 10*3/uL (ref 150–400)
RBC: 4.23 MIL/uL (ref 4.22–5.81)
RDW: 13.4 % (ref 11.5–15.5)
WBC: 7.7 10*3/uL (ref 4.0–10.5)
nRBC: 0 % (ref 0.0–0.2)

## 2020-04-28 LAB — TYPE AND SCREEN
ABO/RH(D): A NEG
Antibody Screen: NEGATIVE

## 2020-04-28 LAB — BASIC METABOLIC PANEL
Anion gap: 14 (ref 5–15)
BUN: 31 mg/dL — ABNORMAL HIGH (ref 8–23)
CO2: 19 mmol/L — ABNORMAL LOW (ref 22–32)
Calcium: 9.4 mg/dL (ref 8.9–10.3)
Chloride: 106 mmol/L (ref 98–111)
Creatinine, Ser: 1.25 mg/dL — ABNORMAL HIGH (ref 0.61–1.24)
GFR, Estimated: 56 mL/min — ABNORMAL LOW (ref >60)
Glucose, Bld: 147 mg/dL — ABNORMAL HIGH (ref 70–99)
Potassium: 4.2 mmol/L (ref 3.5–5.1)
Sodium: 139 mmol/L (ref 135–145)

## 2020-04-28 LAB — GLUCOSE, CAPILLARY
Glucose-Capillary: 137 mg/dL — ABNORMAL HIGH (ref 70–99)
Glucose-Capillary: 183 mg/dL — ABNORMAL HIGH (ref 70–99)
Glucose-Capillary: 199 mg/dL — ABNORMAL HIGH (ref 70–99)
Glucose-Capillary: 195 mg/dL — ABNORMAL HIGH (ref 70–99)

## 2020-04-28 SURGERY — ANTERIOR LATERAL LUMBAR FUSION 1 LEVEL
Anesthesia: General

## 2020-04-28 MED ORDER — METFORMIN HCL 500 MG PO TABS
1000.0000 mg | ORAL_TABLET | Freq: Two times a day (BID) | ORAL | Status: DC
  Administered 2020-04-28 – 2020-04-29 (×2): 1000 mg via ORAL
  Filled 2020-04-28 (×2): qty 2

## 2020-04-28 MED ORDER — METHOCARBAMOL 1000 MG/10ML IJ SOLN
500.0000 mg | Freq: Four times a day (QID) | INTRAVENOUS | Status: DC | PRN
  Filled 2020-04-28: qty 5

## 2020-04-28 MED ORDER — HYDROMORPHONE HCL 1 MG/ML IJ SOLN
INTRAMUSCULAR | Status: AC
  Administered 2020-04-28: 0.5 mg via INTRAVENOUS
  Filled 2020-04-28: qty 1

## 2020-04-28 MED ORDER — OXYCODONE HCL 5 MG/5ML PO SOLN: 5.0000 mg | Freq: Once | ORAL | Status: AC | PRN

## 2020-04-28 MED ORDER — GLYCOPYRROLATE PF 0.2 MG/ML IJ SOSY
PREFILLED_SYRINGE | INTRAMUSCULAR | Status: AC
  Filled 2020-04-28: qty 1

## 2020-04-28 MED ORDER — LIDOCAINE 2% (20 MG/ML) 5 ML SYRINGE
INTRAMUSCULAR | Status: AC
  Filled 2020-04-28: qty 5

## 2020-04-28 MED ORDER — FENTANYL CITRATE (PF) 250 MCG/5ML IJ SOLN
INTRAMUSCULAR | Status: DC | PRN
  Administered 2020-04-28: 100 ug via INTRAVENOUS
  Administered 2020-04-28 (×3): 50 ug via INTRAVENOUS

## 2020-04-28 MED ORDER — LIDOCAINE-EPINEPHRINE 1 %-1:100000 IJ SOLN
INTRAMUSCULAR | Status: DC | PRN
  Administered 2020-04-28: 5 mL

## 2020-04-28 MED ORDER — MENTHOL 3 MG MT LOZG: 1.0000 | LOZENGE | OROMUCOSAL | Status: DC | PRN

## 2020-04-28 MED ORDER — THROMBIN 5000 UNITS EX SOLR
OROMUCOSAL | Status: DC | PRN
  Administered 2020-04-28: 5 mL via TOPICAL

## 2020-04-28 MED ORDER — SUCCINYLCHOLINE CHLORIDE 200 MG/10ML IV SOSY
PREFILLED_SYRINGE | INTRAVENOUS | Status: AC
  Filled 2020-04-28: qty 10

## 2020-04-28 MED ORDER — ONDANSETRON HCL 4 MG/2ML IJ SOLN: 4.0000 mg | Freq: Once | INTRAMUSCULAR | Status: DC | PRN

## 2020-04-28 MED ORDER — CHLORHEXIDINE GLUCONATE 0.12 % MT SOLN
OROMUCOSAL | Status: AC
  Administered 2020-04-28: 15 mL
  Filled 2020-04-28: qty 15

## 2020-04-28 MED ORDER — LIDOCAINE-EPINEPHRINE 1 %-1:100000 IJ SOLN
INTRAMUSCULAR | Status: AC
  Filled 2020-04-28: qty 1

## 2020-04-28 MED ORDER — CHLORHEXIDINE GLUCONATE CLOTH 2 % EX PADS: 6.0000 | MEDICATED_PAD | Freq: Once | CUTANEOUS | Status: DC

## 2020-04-28 MED ORDER — SPIRONOLACTONE 25 MG PO TABS
25.0000 mg | ORAL_TABLET | Freq: Every day | ORAL | Status: DC
  Administered 2020-04-28 – 2020-04-29 (×2): 25 mg via ORAL
  Filled 2020-04-28 (×2): qty 1

## 2020-04-28 MED ORDER — VANCOMYCIN HCL IN DEXTROSE 1-5 GM/200ML-% IV SOLN
1000.0000 mg | INTRAVENOUS | Status: AC
  Administered 2020-04-28: 1000 mg via INTRAVENOUS
  Filled 2020-04-28: qty 200

## 2020-04-28 MED ORDER — PHENOL 1.4 % MT LIQD: 1.0000 | OROMUCOSAL | Status: DC | PRN

## 2020-04-28 MED ORDER — NEBIVOLOL HCL 5 MG PO TABS
5.0000 mg | ORAL_TABLET | Freq: Every day | ORAL | Status: DC
  Administered 2020-04-29: 5 mg via ORAL
  Filled 2020-04-28: qty 1

## 2020-04-28 MED ORDER — ACETAMINOPHEN 160 MG/5ML PO SOLN: 325.0000 mg | ORAL | Status: DC | PRN

## 2020-04-28 MED ORDER — ACETAMINOPHEN 325 MG PO TABS: 650.0000 mg | ORAL_TABLET | ORAL | Status: DC | PRN

## 2020-04-28 MED ORDER — EPHEDRINE 5 MG/ML INJ
INTRAVENOUS | Status: AC
  Filled 2020-04-28: qty 10

## 2020-04-28 MED ORDER — FUROSEMIDE 40 MG PO TABS
40.0000 mg | ORAL_TABLET | Freq: Every day | ORAL | Status: DC
  Administered 2020-04-28 – 2020-04-29 (×2): 40 mg via ORAL
  Filled 2020-04-28 (×2): qty 1

## 2020-04-28 MED ORDER — SODIUM CHLORIDE 0.9% FLUSH: 3.0000 mL | INTRAVENOUS | Status: DC | PRN

## 2020-04-28 MED ORDER — HYDROMORPHONE HCL 1 MG/ML IJ SOLN
0.2500 mg | INTRAMUSCULAR | Status: DC | PRN
  Administered 2020-04-28: 0.5 mg via INTRAVENOUS
  Administered 2020-04-28: 0.25 mg via INTRAVENOUS

## 2020-04-28 MED ORDER — EMPAGLIFLOZIN 10 MG PO TABS
10.0000 mg | ORAL_TABLET | Freq: Every day | ORAL | Status: DC
  Administered 2020-04-28 – 2020-04-29 (×2): 10 mg via ORAL
  Filled 2020-04-28 (×2): qty 1

## 2020-04-28 MED ORDER — FENTANYL CITRATE (PF) 250 MCG/5ML IJ SOLN
INTRAMUSCULAR | Status: AC
  Filled 2020-04-28: qty 5

## 2020-04-28 MED ORDER — EPHEDRINE SULFATE-NACL 50-0.9 MG/10ML-% IV SOSY
PREFILLED_SYRINGE | INTRAVENOUS | Status: DC | PRN
  Administered 2020-04-28 (×5): 10 mg via INTRAVENOUS

## 2020-04-28 MED ORDER — OXYCODONE HCL 5 MG PO TABS
ORAL_TABLET | ORAL | Status: AC
  Administered 2020-04-28: 5 mg via ORAL
  Filled 2020-04-28: qty 1

## 2020-04-28 MED ORDER — ONDANSETRON HCL 4 MG/2ML IJ SOLN: 4.0000 mg | Freq: Four times a day (QID) | INTRAMUSCULAR | Status: DC | PRN

## 2020-04-28 MED ORDER — METHOCARBAMOL 500 MG PO TABS
500.0000 mg | ORAL_TABLET | Freq: Four times a day (QID) | ORAL | Status: DC | PRN
  Administered 2020-04-28 – 2020-04-29 (×4): 500 mg via ORAL
  Filled 2020-04-28 (×4): qty 1

## 2020-04-28 MED ORDER — ONDANSETRON HCL 4 MG/2ML IJ SOLN
INTRAMUSCULAR | Status: AC
  Filled 2020-04-28: qty 2

## 2020-04-28 MED ORDER — MORPHINE SULFATE (PF) 2 MG/ML IV SOLN
2.0000 mg | INTRAVENOUS | Status: DC | PRN
  Administered 2020-04-28: 4 mg via INTRAVENOUS
  Filled 2020-04-28: qty 2

## 2020-04-28 MED ORDER — OXYCODONE HCL 5 MG PO TABS: 5.0000 mg | ORAL_TABLET | Freq: Once | ORAL | Status: AC | PRN

## 2020-04-28 MED ORDER — GLYCOPYRROLATE PF 0.2 MG/ML IJ SOSY
PREFILLED_SYRINGE | INTRAMUSCULAR | Status: DC | PRN
  Administered 2020-04-28: .2 mg via INTRAVENOUS

## 2020-04-28 MED ORDER — ROSUVASTATIN CALCIUM 5 MG PO TABS
10.0000 mg | ORAL_TABLET | Freq: Every day | ORAL | Status: DC
  Administered 2020-04-28 – 2020-04-29 (×2): 10 mg via ORAL
  Filled 2020-04-28 (×2): qty 2

## 2020-04-28 MED ORDER — LACTATED RINGERS IV SOLN: INTRAVENOUS | Status: DC | PRN

## 2020-04-28 MED ORDER — PHENYLEPHRINE HCL-NACL 10-0.9 MG/250ML-% IV SOLN
INTRAVENOUS | Status: DC | PRN
  Administered 2020-04-28: 25 ug/min via INTRAVENOUS

## 2020-04-28 MED ORDER — ONDANSETRON HCL 4 MG PO TABS: 4.0000 mg | ORAL_TABLET | Freq: Four times a day (QID) | ORAL | Status: DC | PRN

## 2020-04-28 MED ORDER — ONDANSETRON HCL 4 MG/2ML IJ SOLN
INTRAMUSCULAR | Status: DC | PRN
  Administered 2020-04-28: 4 mg via INTRAVENOUS

## 2020-04-28 MED ORDER — FLUTICASONE PROPIONATE 50 MCG/ACT NA SUSP
2.0000 | Freq: Every day | NASAL | Status: DC
  Filled 2020-04-28: qty 16

## 2020-04-28 MED ORDER — THROMBIN 5000 UNITS EX SOLR
CUTANEOUS | Status: AC
  Filled 2020-04-28: qty 5000

## 2020-04-28 MED ORDER — BUPIVACAINE HCL (PF) 0.5 % IJ SOLN
INTRAMUSCULAR | Status: DC | PRN
  Administered 2020-04-28: 5 mL

## 2020-04-28 MED ORDER — POLYETHYLENE GLYCOL 3350 17 G PO PACK: 17.0000 g | PACK | Freq: Every day | ORAL | Status: DC | PRN

## 2020-04-28 MED ORDER — DEXAMETHASONE SODIUM PHOSPHATE 10 MG/ML IJ SOLN
INTRAMUSCULAR | Status: DC | PRN
  Administered 2020-04-28: 5 mg via INTRAVENOUS

## 2020-04-28 MED ORDER — AMLODIPINE BESYLATE 5 MG PO TABS
5.0000 mg | ORAL_TABLET | Freq: Every day | ORAL | Status: DC
  Administered 2020-04-29: 5 mg via ORAL
  Filled 2020-04-28: qty 1

## 2020-04-28 MED ORDER — SODIUM CHLORIDE 0.9% FLUSH
3.0000 mL | Freq: Two times a day (BID) | INTRAVENOUS | Status: DC
  Administered 2020-04-28: 3 mL via INTRAVENOUS

## 2020-04-28 MED ORDER — ACETAMINOPHEN 325 MG PO TABS
ORAL_TABLET | ORAL | Status: AC
  Administered 2020-04-28: 650 mg via ORAL
  Filled 2020-04-28: qty 2

## 2020-04-28 MED ORDER — PROPOFOL 10 MG/ML IV BOLUS
INTRAVENOUS | Status: DC | PRN
  Administered 2020-04-28: 180 mg via INTRAVENOUS

## 2020-04-28 MED ORDER — PROPOFOL 10 MG/ML IV BOLUS
INTRAVENOUS | Status: AC
  Filled 2020-04-28: qty 20

## 2020-04-28 MED ORDER — TAMSULOSIN HCL 0.4 MG PO CAPS
0.4000 mg | ORAL_CAPSULE | Freq: Every day | ORAL | Status: DC
  Administered 2020-04-28: 0.4 mg via ORAL
  Filled 2020-04-28: qty 1

## 2020-04-28 MED ORDER — BUPIVACAINE HCL (PF) 0.5 % IJ SOLN
INTRAMUSCULAR | Status: AC
  Filled 2020-04-28: qty 30

## 2020-04-28 MED ORDER — DEXAMETHASONE SODIUM PHOSPHATE 10 MG/ML IJ SOLN
INTRAMUSCULAR | Status: AC
  Filled 2020-04-28: qty 1

## 2020-04-28 MED ORDER — ACETAMINOPHEN 325 MG PO TABS: 325.0000 mg | ORAL_TABLET | ORAL | Status: DC | PRN

## 2020-04-28 MED ORDER — SODIUM CHLORIDE 0.9 % IV SOLN
250.0000 mL | INTRAVENOUS | Status: DC
  Administered 2020-04-28: 250 mL via INTRAVENOUS

## 2020-04-28 MED ORDER — OXYCODONE-ACETAMINOPHEN 5-325 MG PO TABS
1.0000 | ORAL_TABLET | ORAL | Status: DC | PRN
  Administered 2020-04-28 – 2020-04-29 (×5): 2 via ORAL
  Filled 2020-04-28 (×5): qty 2

## 2020-04-28 MED ORDER — ACETAMINOPHEN 650 MG RE SUPP: 650.0000 mg | RECTAL | Status: DC | PRN

## 2020-04-28 MED ORDER — ARTIFICIAL TEARS OPHTHALMIC OINT
TOPICAL_OINTMENT | OPHTHALMIC | Status: AC
  Filled 2020-04-28: qty 3.5

## 2020-04-28 MED ORDER — HYDRALAZINE HCL 25 MG PO TABS
25.0000 mg | ORAL_TABLET | Freq: Three times a day (TID) | ORAL | Status: DC
  Administered 2020-04-28 – 2020-04-29 (×3): 25 mg via ORAL
  Filled 2020-04-28 (×5): qty 1

## 2020-04-28 MED ORDER — 0.9 % SODIUM CHLORIDE (POUR BTL) OPTIME
TOPICAL | Status: DC | PRN
  Administered 2020-04-28: 1000 mL

## 2020-04-28 MED ORDER — GABAPENTIN 300 MG PO CAPS
900.0000 mg | ORAL_CAPSULE | Freq: Three times a day (TID) | ORAL | Status: DC
  Administered 2020-04-28 – 2020-04-29 (×3): 900 mg via ORAL
  Filled 2020-04-28: qty 9
  Filled 2020-04-28 (×2): qty 3

## 2020-04-28 MED ORDER — INSULIN ASPART 100 UNIT/ML ~~LOC~~ SOLN: 0.0000 [IU] | Freq: Three times a day (TID) | SUBCUTANEOUS | Status: DC

## 2020-04-28 MED ORDER — LIDOCAINE 2% (20 MG/ML) 5 ML SYRINGE
INTRAMUSCULAR | Status: DC | PRN
  Administered 2020-04-28: 80 mg via INTRAVENOUS

## 2020-04-28 SURGICAL SUPPLY — 51 items
BLADE CLIPPER SURG (BLADE) IMPLANT
BOLT PLATE XLIF 5.0X55 ST (Bolt) ×4 IMPLANT
BOLT PLATE XLIF 5.0X55MM ST (Bolt) ×2 IMPLANT
BOLT PLATE XLIF 5.5X55 LRG (Bolt) ×4 IMPLANT
BOLT PLATE XLIF 5.5X55MM LRG (Bolt) ×2 IMPLANT
BONE MATRIX OSTEOCEL PRO LRG (Bone Implant) ×3 IMPLANT
COVER WAND RF STERILE (DRAPES) IMPLANT
DERMABOND ADHESIVE PROPEN (GAUZE/BANDAGES/DRESSINGS) ×2
DERMABOND ADVANCED (GAUZE/BANDAGES/DRESSINGS) ×2
DERMABOND ADVANCED .7 DNX12 (GAUZE/BANDAGES/DRESSINGS) ×1 IMPLANT
DERMABOND ADVANCED .7 DNX6 (GAUZE/BANDAGES/DRESSINGS) ×1 IMPLANT
DRAPE C-ARM 42X72 X-RAY (DRAPES) ×6 IMPLANT
DRAPE C-ARMOR (DRAPES) ×3 IMPLANT
DRAPE LAPAROTOMY 100X72X124 (DRAPES) ×3 IMPLANT
DURAPREP 26ML APPLICATOR (WOUND CARE) ×3 IMPLANT
ELECT REM PT RETURN 9FT ADLT (ELECTROSURGICAL) ×3
ELECTRODE REM PT RTRN 9FT ADLT (ELECTROSURGICAL) ×1 IMPLANT
GAUZE 4X4 16PLY RFD (DISPOSABLE) IMPLANT
GLOVE BIO SURGEON STRL SZ 6.5 (GLOVE) ×2 IMPLANT
GLOVE BIO SURGEONS STRL SZ 6.5 (GLOVE) ×1
GLOVE BIOGEL PI IND STRL 6.5 (GLOVE) ×1 IMPLANT
GLOVE BIOGEL PI IND STRL 8.5 (GLOVE) ×1 IMPLANT
GLOVE BIOGEL PI INDICATOR 6.5 (GLOVE) ×2
GLOVE BIOGEL PI INDICATOR 8.5 (GLOVE) ×2
GLOVE ECLIPSE 8.5 STRL (GLOVE) ×3 IMPLANT
GLOVE EXAM NITRILE XL STR (GLOVE) IMPLANT
GOWN STRL REUS W/ TWL LRG LVL3 (GOWN DISPOSABLE) IMPLANT
GOWN STRL REUS W/ TWL XL LVL3 (GOWN DISPOSABLE) ×1 IMPLANT
GOWN STRL REUS W/TWL 2XL LVL3 (GOWN DISPOSABLE) ×3 IMPLANT
GOWN STRL REUS W/TWL LRG LVL3 (GOWN DISPOSABLE)
GOWN STRL REUS W/TWL XL LVL3 (GOWN DISPOSABLE) ×3
HEMOSTAT POWDER KIT SURGIFOAM (HEMOSTASIS) ×3 IMPLANT
KIT BASIN OR (CUSTOM PROCEDURE TRAY) ×3 IMPLANT
KIT DILATOR XLIF 5 (KITS) ×1 IMPLANT
KIT SURGICAL ACCESS MAXCESS 4 (KITS) ×3 IMPLANT
KIT TURNOVER KIT B (KITS) ×3 IMPLANT
KIT XLIF (KITS) ×2
MODULE NVM5 NEXT GEN EMG (NEEDLE) ×3 IMPLANT
MODULUS XLW 12X22X55MM 10 (Spine Construct) ×3 IMPLANT
NEEDLE HYPO 25X1 1.5 SAFETY (NEEDLE) ×3 IMPLANT
NS IRRIG 1000ML POUR BTL (IV SOLUTION) ×3 IMPLANT
PACK LAMINECTOMY NEURO (CUSTOM PROCEDURE TRAY) ×3 IMPLANT
PLATE DECADE XLIF 4H SZ16 SPIN (Plate) ×3 IMPLANT
SPONGE LAP 4X18 RFD (DISPOSABLE) IMPLANT
SUT VIC AB 3-0 SH 8-18 (SUTURE) ×3 IMPLANT
SUT VIC AB 4-0 RB1 18 (SUTURE) ×3 IMPLANT
TAPE CLOTH 4X10 WHT NS (GAUZE/BANDAGES/DRESSINGS) ×6 IMPLANT
TOWEL GREEN STERILE (TOWEL DISPOSABLE) ×3 IMPLANT
TOWEL GREEN STERILE FF (TOWEL DISPOSABLE) ×3 IMPLANT
TRAY FOLEY MTR SLVR 16FR STAT (SET/KITS/TRAYS/PACK) ×3 IMPLANT
WATER STERILE IRR 1000ML POUR (IV SOLUTION) ×3 IMPLANT

## 2020-04-28 NOTE — Progress Notes (Signed)
Inpatient Diabetes Program Recommendations  AACE/ADA: New Consensus Statement on Inpatient Glycemic Control (2015)  Target Ranges:  Prepandial:   less than 140 mg/dL      Peak postprandial:   less than 180 mg/dL (1-2 hours)      Critically ill patients:  140 - 180 mg/dL   Lab Results  Component Value Date   GLUCAP 183 (H) 04/28/2020   HGBA1C 6.8 (H) 01/31/2018    Review of Glycemic Control  Diabetes history: DM 2 Outpatient Diabetes medications: Jardiance 10 mg Daily, Metformin 1000 mg bid Current orders for Inpatient glycemic control:  Novolog 0-20 units tid Metformin 1000 mg bid Jardiance 10 mg Daily  Inpatient Diabetes Program Recommendations:    -  Advise to hold Jardiance (SGLT-2) and have pt to resume at home.  Agree with other medication regimen with Novolog Correction scale and metformin. Decadron 5 mg given during procedure.  Thanks,  Tama Headings RN, MSN, BC-ADM Inpatient Diabetes Coordinator Team Pager [phone] (8a-5p)

## 2020-04-28 NOTE — H&P (Signed)
Kenneth Lee is an 74 y.o. male.   Chief Complaint: Back pain bilateral leg weakness. HPI: Kenneth Lee is a 74 year old individual who has had previous decompressive surgery in the lumbar spine for severe stenosis that resulted in fairly acute leg weakness.  This was at the L2-3 and L3-4 levels.  Previously had a decompression at L4-5 a number of years ago and after surgical decompression stabilization from L2-L5 he regained substantial strength and functionality in his lower extremities.  In the last few months he is noted progressive weakness proximally in the lower extremities and plain x-rays demonstrate adjacent level disease at L1-2 and L5-S1.  He has a severe stenosis on MRI at the L1-2 level is felt that this is likely the cause for his progressive weakness.  He is now admitted to undergo surgical decompression arthrodesis at the L1-2 level using an anterolateral technique.  Past Medical History:  Diagnosis Date  . Arthritis   . Carotid disease, bilateral ([place])   . Chronic back pain    stenosis  . Coronary artery disease    with bare mental stent 2008 and RCA is Card   Kenneth Lee  . DM type 2 (diabetes mellitus, type 2) ([place])    takes Metformin daily  . Gout    yrs ago none recent  . Heart murmur    as a teenager, no problems as an adult  . History of colon polyps    benign  . Hyperlipidemia    takes Crestor daily  . Hypertension    takes Amlodipine,Bystolic,Apresoline,and Lisinopril daily.   . Joint pain   . Other specified cardiac dysrhythmias(427.89)   . Peripheral edema    takes Lasix daily lle edema since 2019 bypass leg numb  . Peripheral neuropathy    feet both  . Prostate cancer ([place])   . Right ankle pain    x ray done 11-12-2019 no cause found right ankle very sensitive  . Sinoatrial node dysfunction ([facility])   . Sleep apnea    uses cpap  . Weakness    numbness and tingling,left leg    Past Surgical History:  Procedure Laterality Date  . ABDOMINAL  AORTOGRAM N/A 12/29/2017   Procedure: ABDOMINAL AORTOGRAM;  Surgeon: Angelia Mould, MD;  Location: [facility];  Service: Cardiovascular;  Laterality: N/A;  . aortobifemoral bypass  1996  . BACK SURGERY  2016 and 2017   lumbar fusion l3 to l4 l 4 to l5  . CARDIAC CATHETERIZATION  2008  . CHOLECYSTECTOMY  2001  . COLONOSCOPY    . CORONARY ANGIOPLASTY     1 stent  . CYSTOSCOPY N/A 11/26/2019   Procedure: CYSTOSCOPY FLEXIBLE;  Surgeon: Robley Fries, MD;  Location: [facility];  Service: Urology;  Laterality: N/A;  No seeds seen per Dr. Claudia Desanctis  . discectomy  20 yrs ago  . ESOPHAGOGASTRODUODENOSCOPY    . EYE SURGERY Right 2018   cataract surgery with lens implant  . FEMORAL-POPLITEAL BYPASS GRAFT Left 02/09/2018   Procedure: LEFT COMMON FEMORAL ENDARTERECTOMY WITH VEIN PATCH ANGIOPLASTY REVISION DISTAL LEFT LIMB OF AORTOFEMORAL BYPASS GRAFT;  Surgeon: Angelia Mould, MD;  Location: [facility];  Service: Vascular;  Laterality: Left;  . LOWER EXTREMITY ANGIOGRAPHY Bilateral 12/29/2017   Procedure: Lower Extremity Angiography;  Surgeon: Angelia Mould, MD;  Location: [facility];  Service: Cardiovascular;  Laterality: Bilateral;  . PROSTATE BIOPSY  2021  . RADIOACTIVE SEED IMPLANT N/A 11/26/2019   Procedure: RADIOACTIVE SEED  IMPLANT/BRACHYTHERAPY IMPLANT;  Surgeon: Robley Fries, MD;  Location: [facility];  Service: Urology;  Laterality: N/A;  90 MINS  . SPACE OAR INSTILLATION N/A 11/26/2019   Procedure: SPACE OAR INSTILLATION;  Surgeon: Robley Fries, MD;  Location: [facility];  Service: Urology;  Laterality: N/A;    Family History  Problem Relation Age of Onset  . Heart disease Mother   . Cancer Mother        type unknown  . Heart attack Mother   . Hypertension Mother   . Hypertension Father   . Hypertension Other   . Hyperlipidemia Other   . Prostate cancer Brother   . Stroke Neg Hx   . Breast  cancer Neg Hx   . Pancreatic cancer Neg Hx   . Colon cancer Neg Hx    Social History:  reports that he has been smoking cigarettes. He has a 45.00 pack-year smoking history. He has never used smokeless tobacco. He reports current alcohol use. He reports that he does not use drugs.  Allergies:  Allergies  Allergen Reactions  . Penicillins Swelling    PATIENT HAS HAD A PCN REACTION WITH IMMEDIATE RASH, FACIAL/TONGUE/THROAT SWELLING, SOB, OR LIGHTHEADEDNESS WITH HYPOTENSION:  #  #  YES  #  #  Has patient had a PCN reaction causing severe rash involving mucus membranes or skin necrosis: No Has patient had a PCN reaction that required hospitalization No Has patient had a PCN reaction occurring within the last 10 years: No    Medications Prior to Admission  Medication Sig Dispense Refill  . amLODipine (NORVASC) 5 MG tablet TAKE 1 TABLET BY MOUTH EVERY DAY (Patient taking differently: Take 5 mg by mouth daily. ) 90 tablet 3  . aspirin 325 MG tablet Take 325 mg by mouth daily.    Marland Kitchen BYSTOLIC 5 MG tablet TAKE 1 TABLET BY MOUTH EVERY DAY (Patient taking differently: Take 5 mg by mouth daily. ) 90 tablet 0  . fluticasone (FLONASE) 50 MCG/ACT nasal spray Place 2 sprays into both nostrils daily. 16 g 6  . furosemide (LASIX) 40 MG tablet TAKE 1 TABLET BY MOUTH EVERY DAY (Patient taking differently: Take 40 mg by mouth daily. ) 90 tablet 3  . gabapentin (NEURONTIN) 300 MG capsule Take 900 mg by mouth 3 (three) times daily.     . hydrALAZINE (APRESOLINE) 25 MG tablet TAKE 1 TABLET BY MOUTH THREE TIMES A DAY (Patient taking differently: Take 25 mg by mouth 3 (three) times daily. ) 270 tablet 1  . JARDIANCE 10 MG TABS tablet Take 10 mg by mouth daily.    Marland Kitchen lisinopril (ZESTRIL) 20 MG tablet TAKE 1 TABLET BY MOUTH TWICE A DAY (Patient taking differently: Take 20 mg by mouth 2 (two) times daily. ) 180 tablet 1  . meloxicam (MOBIC) 15 MG tablet Take 15 mg by mouth daily.  7  . metFORMIN (GLUCOPHAGE) 1000 MG  tablet Take 1,000 mg by mouth 2 (two) times daily with a meal.     . Multiple Vitamin (MULTIVITAMIN WITH MINERALS) TABS tablet Take 1 tablet by mouth daily.    . Naphazoline HCl (CLEAR EYES OP) Place 1 drop into both eyes daily.    . Omega-3 Fatty Acids (FISH OIL) 1000 MG CAPS Take 1,000 mg by mouth daily.     . rosuvastatin (CRESTOR) 10 MG tablet TAKE 1 TABLET BY MOUTH EVERY DAY (Patient taking differently: Take 10 mg by mouth daily. ) 90 tablet 3  .  spironolactone (ALDACTONE) 25 MG tablet TAKE 1 TABLET BY MOUTH EVERY DAY (Patient taking differently: Take 25 mg by mouth daily. ) 90 tablet 2  . tamsulosin (FLOMAX) 0.4 MG CAPS capsule Take 0.4 mg by mouth daily.     . nitroGLYCERIN (NITROSTAT) 0.4 MG SL tablet Place 0.4 mg under the tongue every 5 (five) minutes as needed for chest pain.    . traMADol (ULTRAM) 50 MG tablet Take 1 tablet (50 mg total) by mouth every 6 (six) hours as needed for severe pain (post seed implant). (Patient not taking: Reported on 04/23/2020) 12 tablet 0    Results for orders placed or performed during the hospital encounter of 04/28/20 (from the past 48 hour(s))  Glucose, capillary     Status: Abnormal   Collection Time: 04/28/20  5:57 AM  Result Value Ref Range   Glucose-Capillary 137 (H) 70 - 99 mg/dL    Comment: Glucose reference range applies only to samples taken after fasting for at least 8 hours.   Comment 1 Notify RN   Type and screen     Status: None   Collection Time: 04/28/20  6:30 AM  Result Value Ref Range   ABO/RH(D) A NEG    Antibody Screen NEG    Sample Expiration      05/01/2020,2359 Performed at [facility], [place] [address], [place] [address]   CBC     Status: None   Collection Time: 04/28/20  6:46 AM  Result Value Ref Range   WBC 7.7 4.0 - 10.5 K/uL   RBC 4.23 4.22 - 5.81 MIL/uL   Hemoglobin 13.0 13.0 - 17.0 g/dL   HCT 42.0 39 - 52 %   MCV 99.3 80.0 - 100.0 fL   MCH 30.7 26.0 - 34.0 pg   MCHC 31.0 30.0 - 36.0 g/dL   RDW  13.4 11.5 - 15.5 %   Platelets 158 150 - 400 K/uL   nRBC 0.0 0.0 - 0.2 %    Comment: Performed at [facility], [place] [address], [place], [place] [address]   No results found.  Review of Systems  Constitutional: Positive for activity change.  HENT: Negative.   Eyes: Negative.   Respiratory: Negative.   Cardiovascular: Negative.   Gastrointestinal: Negative.   Endocrine: Negative.   Genitourinary: Negative.   Musculoskeletal: Positive for back pain.  Allergic/Immunologic: Negative.   Neurological: Positive for weakness and light-headedness.  Hematological: Negative.   Psychiatric/Behavioral: Negative.     Blood pressure (!) 133/43, pulse 92, temperature 98.3 F (36.8 C), temperature source Oral, resp. rate 18, height 5\' 11"  (1.803 m), weight 113.4 kg, SpO2 96 %. Physical Exam Constitutional:      Appearance: Normal appearance.  HENT:     Head: Normocephalic and atraumatic.     Nose: Nose normal.     Mouth/Throat:     Mouth: Mucous membranes are moist.  Eyes:     Extraocular Movements: Extraocular movements intact.     Pupils: Pupils are equal, round, and reactive to light.  Cardiovascular:     Rate and Rhythm: Normal rate and regular rhythm.     Pulses: Normal pulses.     Heart sounds: Normal heart sounds.  Pulmonary:     Effort: Pulmonary effort is normal.     Breath sounds: Normal breath sounds.  Abdominal:     General: Abdomen is flat.     Palpations: Abdomen is soft.  Musculoskeletal:  General: Normal range of motion.     Cervical back: Normal range of motion and neck supple.  Skin:    General: Skin is warm and dry.     Capillary Refill: Capillary refill takes less than 2 seconds.  Neurological:     Mental Status: He is alert and oriented to person, place, and time.     Comments: Proximal lower extremity weakness in the iliopsoas graded at 3 out of 5 bilaterally quadricep strength is 4 out of 5.  Patient has some weakness in the tibialis anterior  bilaterally at 4 out of 5 also gastroc strength is 4 out of 5.  Reflexes are absent in the patellae and the Achilles both.  Sensation is diminished below the level of the knee.  Upper extremity strength and reflexes are intact.  Cranial nerve examination is within limits of normal.  Psychiatric:        Mood and Affect: Mood normal.        Behavior: Behavior normal.        Thought Content: Thought content normal.      Assessment/Plan Spondylosis and stenosis L1 to history of decompression and fusion L2-L5.  Plan: Anterolateral decompression L1-2 with lateral plate fixation V8-9.  Earleen Newport, MD 04/28/2020, 7:41 AM

## 2020-04-28 NOTE — Anesthesia Postprocedure Evaluation (Signed)
Anesthesia Post Note  Patient: Kenneth Lee  Procedure(s) Performed: Lumbar One-Two Anterolateral lumbar interbody fusion with lateral plate (N/A )     Patient location during evaluation: PACU Anesthesia Type: General Level of consciousness: awake and alert Pain management: pain level controlled Vital Signs Assessment: post-procedure vital signs reviewed and stable Respiratory status: spontaneous breathing, nonlabored ventilation, respiratory function stable and patient connected to nasal cannula oxygen Cardiovascular status: blood pressure returned to baseline and stable Postop Assessment: no apparent nausea or vomiting Anesthetic complications: no   No complications documented.  Last Vitals:  Vitals:   04/28/20 1120 04/28/20 1135  BP: (!) 147/39 (!) 134/50  Pulse: 66 (!) 56  Resp: 13 (!) 9  Temp:  (!) 36.1 C  SpO2: 96% 94%    Last Pain:  Vitals:   04/28/20 1135  TempSrc:   PainSc: 7                  Detavious Rinn P [place]

## 2020-04-28 NOTE — Progress Notes (Signed)
Orthopedic Tech Progress Note Patient Details:  Kenneth Lee 01/16/1946 [id number] Called in order to [facility] for an [place] Patient ID: Kenneth Lee, male   DOB: 12-11-1945, 74 y.o.   MRN: [id number]   [name] 04/28/2020, 12:17 PM

## 2020-04-28 NOTE — Evaluation (Signed)
Physical Therapy Evaluation Patient Details Name: Kenneth Lee MRN: [id number] DOB: 1946-01-21 Today's Date: 04/28/2020   History of Present Illness  Patient is a 74 year old male who has had previous decompressive suregry in the lumbar spine for severe stenosis that resulted in fairly acute leg weakness. He has underwent a decompression of L4-5 years ago and surgical decompression stabilization from L2-L5. Recently he is noted progressive weakness proximally in LEs with severe stenosis at L1-L2 level per MRI. Patient underwent anterolateral decompression L1-2 on 04/28/2020.  Clinical Impression  PTA, patient was independent using SPC for ambulation, however stated overall weakness and LE sensation changes made it difficult for him to get around. Patient performed supine<>sit with supervision, required cues to maintain spinal precautions throughout. Patient ambulated 23' with SPC and min guard, noted unsteadiness with patient reaching for IV pole and other objects to steady himself. Educated on use of RW for mobility to assist with support and safety. Patient is limited by pain, decreased activity tolerance, impaired balance, impaired functional mobility, and generalized weakness. Patient will benefit from skilled PT services to address listed deficits. Recommend HHPT following discharge at this time to maximize functional mobility and return to PLOF.    Follow Up Recommendations Home health PT;Supervision for mobility/OOB    Equipment Recommendations  Rolling Kenneth Lee with 5" wheels    Recommendations for Other Services       Precautions / Restrictions Precautions Precautions: Back Precaution Booklet Issued: Yes (comment) Required Braces or Orthoses: Spinal Brace ([place] lumbar brace) Spinal Brace: Lumbar corset;Applied in sitting position Restrictions Weight Bearing Restrictions: No      Mobility  Bed Mobility Overal bed mobility: Needs Assistance Bed Mobility: Supine to Sit;Sit  to Supine     Supine to sit: Supervision Sit to supine: Supervision   General bed mobility comments: pt requires supervision for bed mobility due to cues for maintaining back precautions  Transfers Overall transfer level: Needs assistance Equipment used: Straight cane;1 person hand held assist Transfers: Sit to/from Stand Sit to Stand: Min guard            Ambulation/Gait Ambulation/Gait assistance: Min guard Gait Distance (Feet): 15 Feet Assistive device: Straight cane Gait Pattern/deviations: Step-to pattern;Decreased stride length;Wide base of support Gait velocity: decreased   General Gait Details: pt insisted ambulating with SPC, however noted unsteadiness and pt reaching for IV pole and objects to steady himself   Stairs            Wheelchair Mobility    Modified Rankin (Stroke Patients Only)       Balance Overall balance assessment: Needs assistance Sitting-balance support: Feet supported;No upper extremity supported Sitting balance-Leahy Scale: Fair     Standing balance support: Single extremity supported;During functional activity Standing balance-Leahy Scale: Poor                               Pertinent Vitals/Pain Pain Assessment: 0-10 Pain Score: 8  Pain Location: Back Pain Descriptors / Indicators: Operative site guarding;Grimacing;Guarding Pain Intervention(s): Limited activity within patient's tolerance;Monitored during session;Repositioned    Home Living Family/patient expects to be discharged to:: Private residence Living Arrangements: Alone Available Help at Discharge: Family;Friend(s);Available PRN/intermittently Type of Home: House Home Access: Stairs to enter Entrance Stairs-Rails: Left (going up) Entrance Stairs-Number of Steps: 5 Home Layout: One level Home Equipment: Cane - single point;Kenneth Lee - 2 wheels      Prior Function Level of Independence: Independent with assistive device(s)  Comments: Pt  ambulating with SPC prior to surgery     Hand Dominance        Extremity/Trunk Assessment   Upper Extremity Assessment Upper Extremity Assessment: Defer to OT evaluation    Lower Extremity Assessment Lower Extremity Assessment: Generalized weakness    Cervical / Trunk Assessment Cervical / Trunk Assessment: Kyphotic  Communication   Communication: No difficulties  Cognition Arousal/Alertness: Awake/alert Behavior During Therapy: WFL for tasks assessed/performed Overall Cognitive Status: Within Functional Limits for tasks assessed                                        General Comments      Exercises     Assessment/Plan    PT Assessment Patient needs continued PT services  PT Problem List Decreased strength;Decreased activity tolerance;Decreased balance;Decreased mobility;Impaired sensation;Pain       PT Treatment Interventions Gait training;Stair training;Functional mobility training;Therapeutic activities;Therapeutic exercise;Balance training;Patient/family education    PT Goals (Current goals can be found in the Care Plan section)  Acute Rehab PT Goals Patient Stated Goal: to get better PT Goal Formulation: With patient Time For Goal Achievement: 05/12/20 Potential to Achieve Goals: Good    Frequency Min 5X/week   Barriers to discharge Decreased caregiver support      Co-evaluation               AM-PAC PT "6 Clicks" Mobility  Outcome Measure Help needed turning from your back to your side while in a flat bed without using bedrails?: A Little Help needed moving from lying on your back to sitting on the side of a flat bed without using bedrails?: A Little Help needed moving to and from a bed to a chair (including a wheelchair)?: A Little Help needed standing up from a chair using your arms (e.g., wheelchair or bedside chair)?: A Little Help needed to walk in hospital room?: A Little Help needed climbing 3-5 steps with a railing? :  A Little 6 Click Score: 18    End of Session Equipment Utilized During Treatment: Gait belt;Back brace Activity Tolerance: Patient limited by pain Patient left: in bed;with call bell/phone within reach Nurse Communication: Mobility status PT Visit Diagnosis: Unsteadiness on feet (R26.81);Other abnormalities of gait and mobility (R26.89);Muscle weakness (generalized) (M62.81);Difficulty in walking, not elsewhere classified (R26.2);Pain Pain - part of body:  (Back)    Time: 6759-1638 PT Time Calculation (min) (ACUTE ONLY): 21 min   Charges:   PT Evaluation $PT Eval Moderate Complexity: 1 Mod          Perrin Maltese, PT, DPT Acute Rehabilitation Services Pager [phone] Office [phone]   Kenneth Lee 04/28/2020, 4:13 PM

## 2020-04-28 NOTE — Transfer of Care (Signed)
Immediate Anesthesia Transfer of Care Note  Patient: Kenneth Lee   Procedure(s) Performed: Lumbar One-Two Anterolateral lumbar interbody fusion with lateral plate (N/A )  Patient Location: PACU  Anesthesia Type:General  Level of Consciousness: awake, alert  and oriented  Airway & Oxygen Therapy: Patient Spontanous Breathing and Patient connected to face mask oxygen  Post-op Assessment: Report given to RN and Post -op Vital signs reviewed and stable  Post vital signs: Reviewed and stable  Last Vitals:  Vitals Value Taken Time  BP 149/87   Temp    Pulse 86   Resp 14   SpO2 97     Last Pain:  Vitals:   04/28/20 0649  TempSrc:   PainSc: 0-No pain      Patients Stated Pain Goal: 3 (84/72/07 2182)  Complications: No complications documented.

## 2020-04-28 NOTE — Op Note (Signed)
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  History of decompression and fusion L2-L5.  Neurogenic claudication.  Lumbar radiculopathy.  Cauda equina syndrome. Postoperative diagnosis: Same Procedure: Anterolateral decompression L1-L2 using X-LIF technique and extreme lateral spacer.  Allograft arthrodesis with ostia cell.  Lateral plate fixation with 4 hold 32mm plate.  Neuro monitoring with EMG.  Fluoroscopic visualization. Assistant: Osie Cheeks, NP Anesthesia: General endotracheal Indications: Kenneth Lee is a 74 year old individual who has had significant problems with back pain leg weakness that has become increasingly progressive.  He has had a severe stenosis L2-L5 in the past which has undergone surgical decompression and stabilization.  Patient was advised regarding the need for surgery at L1-L2 as he was developing recurrent symptoms of cauda equina syndrome with proximal and distal leg weakness and lumbar radicular pain.  Procedure: The patient was brought to the operating room supine on the stretcher.  After the smooth induction of general endotracheal anesthesia, he was carefully turned to the left lateral decubitus position for right-sided exposure.  He was taped in the orthogonal position with his legs slightly flexed and EMG and neuro monitoring was applied to the major groups of the lower lumbar musculature to check the various nerve root distributions from L1-S1.  Then when positioning was verified the area over the L1-L2 disc space was marked on the lateral aspect of his right side and the skin was prepped with alcohol DuraPrep and draped in a sterile fashion.  The area was infiltrated with 10 cc of combination of lidocaine Marcaine with 1-100,000 epinephrine.  Incision was created and carried down through the superficial fascia.  A blunt tipped probe was then used to dissect through the outer layers into the  retroperitoneum.  The area over the psoas muscle was then entered bluntly and the probe was docked on the disc space laterally at the L1-L2 level on the right.  EMG monitoring was performed at this time and at all stages through the procedure to make sure there is no irritability of elements of the lumbar plexus.  When none were found a K wire was passed into the disc space at L1-L2.  A series of dilators were then passed over the initial blunt tipped probe and sequentially monitoring was performed as each of the dilators was passed the monitoring was performed in a circumferential fashion around the probe to make sure no elements of the lumbar plexus were being involved or stimulated.  When this was verified 140 mm long retractor was passed over the 16 mm dilator tube and this was then secured to the disc space with a shim being placed into the posterior aspect of the disc space under fluoroscopic visualization.  Again prior to placing the shim neural stimulation the soft tissue surrounding this area was performed to make sure no elements of the lumbar plexus were nearby.  The retractor was then dilated open to expose the lateral aspect of the disc space and some slips of the psoas muscle were cauterized with the bipolar cautery and divided with a 15 blade the disc space was opened with a #15 blade and there was noted to be a large vacuum phenomenon on the right side of the disc space which allowed it to open easily.  With this a series of curettes and rongeurs were used to evacuate endplate cartilage from the disc space and allow dilation of the space using first an 8 mm tall dilator then the 10 and  ultimately a 12 mm dilator measuring 22 mm in anterior posterior dimension ultimately it was the size that was chosen as the implant measuring 12 x 22 x 55 mm with 10 degrees of lordosis this was a titanium implant that was packed with autograft and allograft.  Plate was placed under fluoroscopic guidance and aligned  in the midline.  Once this was accomplished a lateral plate was chosen initially a 12 mm plate was chosen however there is found that the superior screws were abutting into the spacer and not capturing an adequate amount of bone ultimately was decided to use a 55mm plate and drill the screw holes individually.  55 mm screws were used ventrally and 55 mm screws were used dorsally to secure the plate.  Once secured and all the aspects were tightened the retractor was carefully inspected and the soft tissues were inspected with the retractor being gradually reduced and then removed.  Good hemostasis was noted throughout.  Total blood loss for the procedure was estimated at 75 cc the deep fascia was closed with 3-0 Vicryl subcutaneous tissue with 3-0 Vicryl and subcuticular skin was closed with 4-0 Vicryl.  Dermabond was placed on the skin.  Patient tolerated procedure well is returned to recovery room in stable condition.  All throughout the case EMG monitoring was stable and no irritability of the lumbar plexus was noted.

## 2020-04-28 NOTE — Anesthesia Procedure Notes (Signed)
Procedure Name: Intubation Date/Time: 04/28/2020 7:56 AM Performed by: Alain Marion, CRNA Pre-anesthesia Checklist: Patient identified, Emergency Drugs available, Suction available and Patient being monitored Patient Re-evaluated:Patient Re-evaluated prior to induction Oxygen Delivery Method: Circle System Utilized Preoxygenation: Pre-oxygenation with 100% oxygen Induction Type: IV induction Ventilation: Oral airway inserted - appropriate to patient size and Two handed mask ventilation required Laryngoscope Size: Glidescope and 4 Grade View: Grade I Tube type: Oral Tube size: 7.5 mm Number of attempts: 1 Airway Equipment and Method: Stylet,  Oral airway and Video-laryngoscopy Placement Confirmation: ETT inserted through vocal cords under direct vision,  positive ETCO2 and breath sounds checked- equal and bilateral Secured at: 23 cm Tube secured with: Tape Dental Injury: Teeth and Oropharynx as per pre-operative assessment

## 2020-04-29 ENCOUNTER — Encounter (HOSPITAL_COMMUNITY): Payer: Self-pay | Admitting: Neurological Surgery

## 2020-04-29 LAB — GLUCOSE, CAPILLARY: Glucose-Capillary: 154 mg/dL — ABNORMAL HIGH (ref 70–99)

## 2020-04-29 MED ORDER — ASPIRIN 325 MG PO TABS: ORAL_TABLET | ORAL | 0 refills | Status: AC

## 2020-04-29 MED ORDER — OXYCODONE-ACETAMINOPHEN 5-325 MG PO TABS: 1.0000 | ORAL_TABLET | Freq: Four times a day (QID) | ORAL | 0 refills | Status: AC | PRN

## 2020-04-29 MED ORDER — METHOCARBAMOL 500 MG PO TABS: 500.0000 mg | ORAL_TABLET | Freq: Three times a day (TID) | ORAL | 2 refills | Status: AC | PRN

## 2020-04-29 MED ORDER — MELOXICAM 15 MG PO TABS: ORAL_TABLET | ORAL | 0 refills | Status: AC

## 2020-04-29 NOTE — Plan of Care (Signed)
Adequate for discharge.

## 2020-04-29 NOTE — Progress Notes (Signed)
Physical Therapy Treatment Patient Details Name: Kenneth Lee MRN: [id number] DOB: October 17, 1945 Today's Date: 04/29/2020    History of Present Illness Patient is a 74 year old male who has had previous decompressive suregry in the lumbar spine for severe stenosis that resulted in fairly acute leg weakness. He has underwent a decompression of L4-5 years ago and surgical decompression stabilization from L2-L5. Recently he is noted progressive weakness proximally in LEs with severe stenosis at L1-L2 level per MRI. Patient underwent anterolateral decompression L1-2 on 04/28/2020.    PT Comments    Pt progressing well with post-op mobility. He was able to demonstrate transfers and ambulation with gross min guard assist to supervision for safety with RW for support. Pt with notable foot drop on the R and pt reporting instances where he has caught the R foot while stepping. Due to increased risk for falls, and longevity of foot drop (pt reports 3 years) recommended AFO and initiation of gastroc/soleus stretches. [employer] contacted and to come see pt prior to d/c for AFO fitting. Pt was educated on precautions, brace application/wearing schedule, appropriate activity progression, and car transfer. Will continue to follow.     Follow Up Recommendations  Home health PT;Supervision for mobility/OOB     Equipment Recommendations  Rolling walker with 5" wheels    Recommendations for Other Services       Precautions / Restrictions Precautions Precautions: Back Precaution Booklet Issued: Yes (comment) Required Braces or Orthoses: Spinal Brace Spinal Brace: Lumbar corset;Applied in sitting position Restrictions Weight Bearing Restrictions: No    Mobility  Bed Mobility               General bed mobility comments: seated EOB upon arrival. Verbally discussed log roll technique.   Transfers Overall transfer level: Needs assistance Equipment used: None;Rolling walker (2 wheeled) Transfers:  Sit to/from Stand Sit to Stand: Min guard         General transfer comment: supervision-min guard throughout.   Ambulation/Gait Ambulation/Gait assistance: Min guard;Supervision Gait Distance (Feet): 200 Feet Assistive device: Rolling walker (2 wheeled) Gait Pattern/deviations: Decreased stride length;Wide base of support;Decreased dorsiflexion - right;Step-through pattern Gait velocity: decreased Gait velocity interpretation: <1.8 ft/sec, indicate of risk for recurrent falls General Gait Details: RW throughout due to unsteadiness. Min guard progressing to supervision for safety with R foot drop prominent.    Stairs         General stair comments: Pt declines stair training.    Wheelchair Mobility    Modified Rankin (Stroke Patients Only)       Balance Overall balance assessment: Needs assistance Sitting-balance support: Feet supported Sitting balance-Leahy Scale: Good     Standing balance support: Single extremity supported;Bilateral upper extremity supported;No upper extremity supported;During functional activity Standing balance-Leahy Scale: Fair Standing balance comment: static standing at sink with up to minguard for balance                             Cognition Arousal/Alertness: Awake/alert Behavior During Therapy: WFL for tasks assessed/performed Overall Cognitive Status: Within Functional Limits for tasks assessed                                        Exercises      General Comments        Pertinent Vitals/Pain Pain Assessment: Faces Faces Pain Scale: Hurts little more  Pain Location: back, incisional Pain Descriptors / Indicators: Operative site guarding;Discomfort;Sore Pain Intervention(s): Limited activity within patient's tolerance;Monitored during session;Repositioned    Home Living Family/patient expects to be discharged to:: Private residence Living Arrangements: Alone Available Help at Discharge:  Family;Friend(s);Available PRN/intermittently (daughter to stay initially) Type of Home: House Home Access: Stairs to enter Entrance Stairs-Rails: Left (going up) Home Layout: One level Home Equipment: Cane - single point;Walker - 2 wheels      Prior Function Level of Independence: Independent with assistive device(s)      Comments: Pt ambulating with SPC prior to surgery   PT Goals (current goals can now be found in the care plan section) Acute Rehab PT Goals Patient Stated Goal: to get better PT Goal Formulation: With patient Time For Goal Achievement: 05/12/20 Potential to Achieve Goals: Good Progress towards PT goals: Progressing toward goals    Frequency    Min 5X/week      PT Plan Current plan remains appropriate    Co-evaluation              AM-PAC PT "6 Clicks" Mobility   Outcome Measure  Help needed turning from your back to your side while in a flat bed without using bedrails?: A Little Help needed moving from lying on your back to sitting on the side of a flat bed without using bedrails?: A Little Help needed moving to and from a bed to a chair (including a wheelchair)?: A Little Help needed standing up from a chair using your arms (e.g., wheelchair or bedside chair)?: A Little Help needed to walk in hospital room?: A Little Help needed climbing 3-5 steps with a railing? : A Little 6 Click Score: 18    End of Session Equipment Utilized During Treatment: Gait belt;Back brace Activity Tolerance: Patient limited by pain Patient left: in bed;with call bell/phone within reach Nurse Communication: Mobility status PT Visit Diagnosis: Unsteadiness on feet (R26.81);Other abnormalities of gait and mobility (R26.89);Muscle weakness (generalized) (M62.81);Difficulty in walking, not elsewhere classified (R26.2);Pain Pain - part of body:  (Back)     Time: 5686-1683 PT Time Calculation (min) (ACUTE ONLY): 22 min  Charges:  $Gait Training: 8-22 mins                      Rolinda Roan, PT, DPT Acute Rehabilitation Services Pager: [phone] Office: [phone]    Thelma Comp 04/29/2020, 1:12 PM

## 2020-04-29 NOTE — Evaluation (Signed)
Occupational Therapy Evaluation Patient Details Name: Kenneth Lee MRN: [id number] DOB: 01/18/1946 Today's Date: 04/29/2020    History of Present Illness Patient is a 74 year old male who has had previous decompressive suregry in the lumbar spine for severe stenosis that resulted in fairly acute leg weakness. He has underwent a decompression of L4-5 years ago and surgical decompression stabilization from L2-L5. Recently he is noted progressive weakness proximally in LEs with severe stenosis at L1-L2 level per MRI. Patient underwent anterolateral decompression L1-2 on 04/28/2020.   Clinical Impression   This 74 y/o male presents with the above. PTA pt living alone and reports being mod independent with ADL and and mobility using SPC. Pt currently performing room level mobility tasks with minguard assist - educated/reinforced use of RW for increased stability as pt self-reports imbalances with use of SPC. He requires grossly minA for LB ADL given difficulty reaching LEs while maintaining back precautions. Further reviewed and educated pt re: back precautions, AE, safety and compensatory techniques for performing ADL and mobility tasks while maintaining precautions with pt verbalizing/return demonstrating understanding. Pt reports plans to return home with assist from daughter (?daughter to stay initially) and friend. He will benefit from additional OT services while in acute setting to maximize his overall safety and independence with ADL and mobility.     Follow Up Recommendations  No OT follow up;Supervision/Assistance - 24 hour (close to 24hr initially )    Equipment Recommendations  None recommended by OT (ptdeclined need for DME, reports can get showerseat ifneeded)           Precautions / Restrictions Precautions Precautions: Back Precaution Booklet Issued: Yes (comment) Required Braces or Orthoses: Spinal Brace ([place] lumbar brace) Spinal Brace: Lumbar corset;Applied in sitting  position Restrictions Weight Bearing Restrictions: No      Mobility Bed Mobility               General bed mobility comments: seated EOB upon arrival    Transfers Overall transfer level: Needs assistance Equipment used: None;Rolling walker (2 wheeled) Transfers: Sit to/from Stand Sit to Stand: Min guard         General transfer comment: supervision-minguard throughout; pt with x1 instance of imbalance with standing (while advancing pants over hips), no physical assist required to correct but minguard provided to ensure safety    Balance Overall balance assessment: Needs assistance Sitting-balance support: Feet supported Sitting balance-Leahy Scale: Good     Standing balance support: Single extremity supported;Bilateral upper extremity supported;No upper extremity supported;During functional activity Standing balance-Leahy Scale: Fair Standing balance comment: static standing at sink with up to minguard for balance                            ADL either performed or assessed with clinical judgement   ADL Overall ADL's : Needs assistance/impaired Eating/Feeding: Modified independent;Sitting   Grooming: Oral care;Supervision/safety;Standing;Cueing for compensatory techniques Grooming Details (indicate cue type and reason): good carryover of compensatory techniques for oral care  Upper Body Bathing: Supervision/ safety;Sitting   Lower Body Bathing: Minimal assistance;Sit to/from stand;Sitting/lateral leans Lower Body Bathing Details (indicate cue type and reason): pt having difficulty reaching his LEs Upper Body Dressing : Set up;Sitting Upper Body Dressing Details (indicate cue type and reason): cues for proper setup of spinal brace Lower Body Dressing: Minimal assistance;Sit to/from stand;With adaptive equipment Lower Body Dressing Details (indicate cue type and reason): educated in use of reacher for donning pants as  pt unable to don without bending  forward. pt return demonstrating carryover using AE and educated in resources to obtain. pt standing to don slide on shoes using UE support for balance on RW  Toilet Transfer: Min guard;Ambulation Toilet Transfer Details (indicate cue type and reason): simulated via transfer to/from EOB Toileting- Clothing Manipulation and Hygiene: Min guard;Sit to/from stand       Functional mobility during ADLs: Min guard General ADL Comments: pt endorses having imbalances and reports improved with use of RW, however when exiting to gather supplies and returning to room pt mobilizing with [facility] and UE support on wall. further educated/reinforced using RW for increased safety and balance      Pertinent Vitals/Pain Pain Assessment: Faces Faces Pain Scale: Hurts little more Pain Location: back, incisional Pain Descriptors / Indicators: Operative site guarding;Discomfort;Sore Pain Intervention(s): Monitored during session;Repositioned;Limited activity within patient's tolerance     Hand Dominance     Extremity/Trunk Assessment Upper Extremity Assessment Upper Extremity Assessment: Overall WFL for tasks assessed   Lower Extremity Assessment Lower Extremity Assessment: Defer to PT evaluation   Cervical / Trunk Assessment Cervical / Trunk Assessment: Other exceptions Cervical / Trunk Exceptions: s/p spinal sx    Communication Communication Communication: No difficulties   Cognition Arousal/Alertness: Awake/alert Behavior During Therapy: WFL for tasks assessed/performed Overall Cognitive Status: Within Functional Limits for tasks assessed                                     General Comments       Exercises     Shoulder Instructions      Home Living Family/patient expects to be discharged to:: Private residence Living Arrangements: Alone Available Help at Discharge: Family;Friend(s);Available PRN/intermittently (daughter to stay initially) Type of Home: House Home Access:  Stairs to enter [employer] of Steps: 5 Entrance Stairs-Rails: Left (going up) Home Layout: One level     Bathroom Shower/Tub: Teacher, early years/pre: Standard     Home Equipment: Cane - single point;Walker - 2 wheels          Prior Functioning/Environment Level of Independence: Independent with assistive device(s)        Comments: Pt ambulating with SPC prior to surgery        OT Problem List: Decreased range of motion;Decreased strength;Decreased activity tolerance;Impaired balance (sitting and/or standing);Decreased knowledge of precautions;Decreased knowledge of use of DME or AE;Pain      OT Treatment/Interventions: Self-care/ADL training;Therapeutic exercise;Energy conservation;DME and/or AE instruction;Therapeutic activities;Patient/family education;Balance training    OT Goals(Current goals can be found in the care plan section) Acute Rehab OT Goals Patient Stated Goal: to get better OT Goal Formulation: With patient Time For Goal Achievement: 05/13/20 Potential to Achieve Goals: Good  OT Frequency: Min 2X/week   Barriers to D/C:            Co-evaluation              AM-PAC OT "6 Clicks" Daily Activity     Outcome Measure Help from another person eating meals?: None Help from another person taking care of personal grooming?: A Little Help from another person toileting, which includes using toliet, bedpan, or urinal?: A Little Help from another person bathing (including washing, rinsing, drying)?: A Little Help from another person to put on and taking off regular upper body clothing?: A Little Help from another person to put on and taking off regular  lower body clothing?: A Little 6 Click Score: 19   End of Session Equipment Utilized During Treatment: Back brace;Rolling walker;Other (comment) [facility]) Nurse Communication: Mobility status  Activity Tolerance: Patient tolerated treatment well Patient left: with call bell/phone  within reach;Other (comment) (seated EOB)  OT Visit Diagnosis: Other abnormalities of gait and mobility (R26.89);Unsteadiness on feet (R26.81)                Time: 1610-9604 OT Time Calculation (min): 22 min Charges:  OT General Charges $OT Visit: 1 Visit OT Evaluation $OT Eval Low Complexity: [place], OT Acute Rehabilitation Services Pager [phone] Office [phone]   Raymondo Band 04/29/2020, 11:41 AM

## 2020-04-29 NOTE — Progress Notes (Addendum)
Orthopedic Tech Progress Note Patient Details:  Kenneth Lee [birth year] [id number] Called in order to [employer] for an AFO Patient ID: Kenneth Lee, male   DOB: 02-May-1946, 74 y.o.   MRN: [id number]   [name] 04/29/2020, 9:17 AM

## 2020-04-29 NOTE — Discharge Instructions (Signed)
Slowly increase your activity back to normal. No bending, no heavy lifting more than 10 pounds. Ambulate daily. Walk straight, sit straight and stand straight. No driving for a week. Your incision is closed with dermabond (purple glue). This will naturally fall off over the next 1-2 weeks.  Okay to shower for today. Use regular soap and water and try to be gentle when cleaning your incision. Pat the incision dry after shower. Follow up with [name] in 2-3 weeks after discharge. If you do not already have a discharge appointment, please call his office at [phone] to schedule a follow up appointment. If you have any concerns or questions, please call the office and let us know. Dont take fish oils, aspirin, NSAIDS in the next 5 days, can restart in 05/04/2020

## 2020-04-29 NOTE — Progress Notes (Addendum)
Patient alert and oriented, mae's well, voiding adequate amount of urine, swallowing without difficulty, no c/o pain at time of discharge. Patient discharged home with family. Script and discharged instructions given to patient. Patient and family stated understanding of instructions given. Outside vendor delivered AFO boot for patient and patient refused order. Outside vendor returned item to company. Patient has an appointment with [name]

## 2020-04-29 NOTE — Progress Notes (Signed)
Per therapy, no Home Health needed. No other needs identified.   Rosealie Reach LCSW

## 2020-04-29 NOTE — Discharge Summary (Signed)
Physician Discharge Summary  Patient ID: Kenneth Lee MRN: [id number] DOB/AGE: 01/12/1946 74 y.o.  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  History of decompression and fusion L2-L5.  Neurogenic claudication.  Lumbar radiculopathy.  Cauda equina syndrome.  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  History of decompression and fusion L2-L5.  Neurogenic claudication.  Lumbar radiculopathy.  Cauda equina syndrome. Discharged Condition: good  Hospital Course: uncomplicated/stable  The patient was admitted on 04/28/2020 and taken to the operating room for Anterolateral decompression L1-L2 using X-LIF technique and extreme lateral spacer.  Allograft arthrodesis with ostia cell.  Lateral plate fixation with 4 hold 41mm plate . The patient tolerated the procedure well. The patient taken to the recovery room and then to the floor in stable condition. The hospital course was routine. There was no complications. The wound remains clean, dry and intact. The patient remained afebrile with stable vital signs, and tolerated a regular diet. The pain is well controlled with oral pain medications. I will send home Percoset and [place] for patient. I spoke to patient about wound care and discharge instructions. All questions were answered.  Pt has been ambulating around the hallway.No difficulty noted. Pt is ready to go home. Pt states that his radicular pain has been improved significantly. Pt states that his bilateral leg weakness has improved. He is scheduled to follow up with [name] in clinic in 2-3 weeks. Instructed to stop aspirin, NSAIDs, and fish oils for 5 days.    Discharge Exam: Blood pressure (!) 113/56, pulse (!) 53, temperature 97.8 F (36.6 C), temperature source Oral, resp. rate 18, height 5\' 11"  (1.803 m), weight 113.4 kg, SpO2 91 %. Alert and oriented x  4 PERRLA CN II-XII grossly intact MAE, LLE plantar flexion 4/5, dorsiflexion 4/5, RLE PF: 4/5, DF: 3/5.  Incision is covered with Honeycomb dressing and Steri Strips; Dressing is clean, dry, and intact  Disposition: Discharge disposition: 01-Home or Self Care       Discharge Instructions    Incentive spirometry RT   Complete by: As directed      Allergies as of 04/29/2020      Reactions   Penicillins Swelling   PATIENT HAS HAD A PCN REACTION WITH IMMEDIATE RASH, FACIAL/TONGUE/THROAT SWELLING, SOB, OR LIGHTHEADEDNESS WITH HYPOTENSION:  #  #  YES  #  #  Has patient had a PCN reaction causing severe rash involving mucus membranes or skin necrosis: No Has patient had a PCN reaction that required hospitalization No Has patient had a PCN reaction occurring within the last 10 years: No      Medication List    STOP taking these medications   Fish Oil 1000 MG Caps   traMADol 50 MG tablet Commonly known as: Ultram     TAKE these medications   amLODipine 5 MG tablet Commonly known as: NORVASC TAKE 1 TABLET BY MOUTH EVERY DAY   aspirin 325 MG tablet Restart in 5-7 days 10/25 What changed:   how much to take  how to take this  when to take this  additional instructions   Bystolic 5 MG tablet Generic drug: nebivolol TAKE 1 TABLET BY MOUTH EVERY DAY What changed: how much to take   CLEAR EYES OP Place 1 drop into both eyes daily.   fluticasone 50 MCG/ACT nasal spray Commonly known as: FLONASE Place 2 sprays into both nostrils daily.   furosemide 40 MG  tablet Commonly known as: LASIX TAKE 1 TABLET BY MOUTH EVERY DAY   gabapentin 300 MG capsule Commonly known as: NEURONTIN Take 900 mg by mouth 3 (three) times daily.   hydrALAZINE 25 MG tablet Commonly known as: APRESOLINE TAKE 1 TABLET BY MOUTH THREE TIMES A DAY   Jardiance 10 MG Tabs tablet Generic drug: empagliflozin Take 10 mg by mouth daily.   lisinopril 20 MG tablet Commonly known as:  ZESTRIL TAKE 1 TABLET BY MOUTH TWICE A DAY What changed:   how much to take  how to take this  when to take this  additional instructions   meloxicam 15 MG tablet Commonly known as: MOBIC Restart in 5 days 10/25 What changed:   how much to take  how to take this  when to take this  additional instructions   metFORMIN 1000 MG tablet Commonly known as: GLUCOPHAGE Take 1,000 mg by mouth 2 (two) times daily with a meal.   methocarbamol 500 MG tablet Commonly known as: Robaxin Take 1 tablet (500 mg total) by mouth every 8 (eight) hours as needed for muscle spasms.   multivitamin with minerals Tabs tablet Take 1 tablet by mouth daily.   nitroGLYCERIN 0.4 MG SL tablet Commonly known as: NITROSTAT Place 0.4 mg under the tongue every 5 (five) minutes as needed for chest pain.   oxyCODONE-acetaminophen 5-325 MG tablet Commonly known as: Percocet Take 1-2 tablets by mouth every 6 (six) hours as needed for severe pain.   rosuvastatin 10 MG tablet Commonly known as: CRESTOR TAKE 1 TABLET BY MOUTH EVERY DAY   spironolactone 25 MG tablet Commonly known as: ALDACTONE TAKE 1 TABLET BY MOUTH EVERY DAY   tamsulosin 0.4 MG Caps capsule Commonly known as: FLOMAX Take 0.4 mg by mouth daily.        Signed: Osie Cheeks 04/29/2020, 9:32 AM

## 2020-05-03 DIAGNOSIS — I469 Cardiac arrest, cause unspecified: Secondary | ICD-10-CM | POA: Diagnosis not present

## 2020-05-03 DIAGNOSIS — R404 Transient alteration of awareness: Secondary | ICD-10-CM | POA: Diagnosis not present

## 2020-05-03 DIAGNOSIS — I499 Cardiac arrhythmia, unspecified: Secondary | ICD-10-CM | POA: Diagnosis not present

## 2020-05-03 DIAGNOSIS — R000 Tachycardia, unspecified: Secondary | ICD-10-CM | POA: Diagnosis not present

## 2020-05-05 ENCOUNTER — Telehealth: Payer: Self-pay | Admitting: Neurology

## 2020-05-11 ENCOUNTER — Ambulatory Visit: Payer: PPO | Admitting: Neurology

## 2020-05-11 DIAGNOSIS — 419620001 Death: Secondary | SNOMED CT | POA: Diagnosis not present

## 2020-05-11 DEATH — deceased

## 2020-05-18 NOTE — Telephone Encounter (Signed)
error 

## 2020-06-03 ENCOUNTER — Other Ambulatory Visit: Payer: Self-pay | Admitting: Interventional Cardiology

## 2020-06-27 ENCOUNTER — Other Ambulatory Visit: Payer: Self-pay | Admitting: Interventional Cardiology

## 2020-07-24 ENCOUNTER — Other Ambulatory Visit: Payer: Self-pay | Admitting: Interventional Cardiology

## 2021-04-26 IMAGING — NM NM BONE WHOLE BODY
2 series · 2 of 2 positions shown · non-contrast
Comparison: None

Radiographic correlation: CT abdomen and pelvis 09/03/2019

CLINICAL DATA: Prostate cancer, PSA 4.57, question osseous
metastasis

EXAM:
NUCLEAR MEDICINE WHOLE BODY BONE SCAN
TECHNIQUE: Whole body anterior and posterior images were obtained approximately
3 hours after intravenous injection of radiopharmaceutical.
RADIOPHARMACEUTICALS:  21.9 mCi 1echnetium-33m MDP IV

[Series 1: wbr_bone_40 whole body · 2.66mm/px · 1 of 1 slices shown (1 of 2)]
[im 1/1]
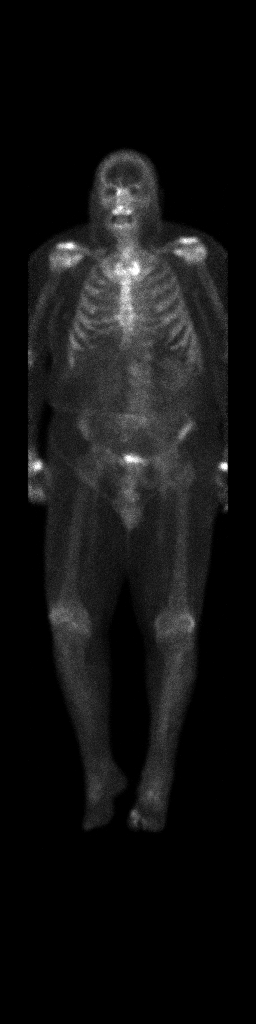

[Series 1: wbr_bone_40 whole body · 2.66mm/px · 1 of 1 slices shown (2 of 2)]
[im 1/1]
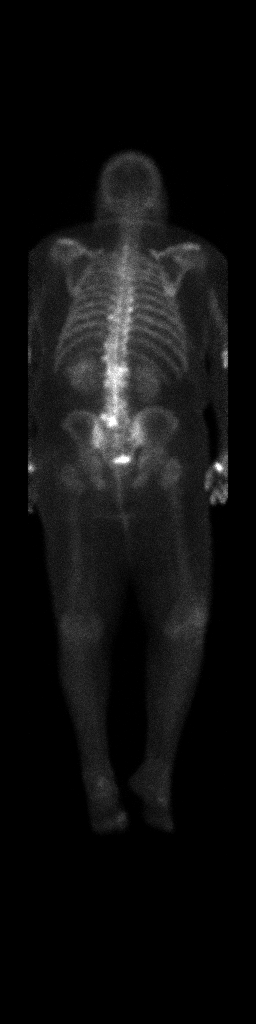

[2 of 2 positions shown; findings below may reference images not displayed]

FINDINGS: Uptake identified at multiple costovertebral junctions at the
thoracic spine, degenerative.

Uptake in lumbar spine at L1-L2 and L2-L3 corresponding to
degenerative and surgical changes.

Significant uptake at LEFT L5-S1 corresponding to advanced facet
degenerative changes on CT.

Uptake at shoulders, sternoclavicular joints, and wrists, less at
knees, typically degenerative.

Asymmetric appearance of the anterior aspects of the iliac wings
identified on anterior view, LEFT more prominent than RIGHT, felt to
be related to pelvic rotation rather than a metastatic lesion; no
corresponding CT changes at this site.

Expected urinary tract and soft tissue distribution of tracer.
IMPRESSION: Multiple sites of degenerative type uptake as above.

No definite scintigraphic evidence of osseous metastatic disease.
